# Patient Record
Sex: Male | Born: 1935
Health system: Southern US, Community
[De-identification: ages and names within clinical notes are randomized; demographics above are authoritative.]

## PROBLEM LIST (undated history)

## (undated) DIAGNOSIS — J45909 Unspecified asthma, uncomplicated: Secondary | ICD-10-CM

## (undated) DIAGNOSIS — K219 Gastro-esophageal reflux disease without esophagitis: Secondary | ICD-10-CM

## (undated) DIAGNOSIS — R259 Unspecified abnormal involuntary movements: Secondary | ICD-10-CM

## (undated) DIAGNOSIS — C801 Malignant (primary) neoplasm, unspecified: Secondary | ICD-10-CM

## (undated) DIAGNOSIS — R5381 Other malaise: Secondary | ICD-10-CM

## (undated) DIAGNOSIS — Z87891 Personal history of nicotine dependence: Secondary | ICD-10-CM

## (undated) DIAGNOSIS — L989 Disorder of the skin and subcutaneous tissue, unspecified: Secondary | ICD-10-CM

## (undated) DIAGNOSIS — K5730 Diverticulosis of large intestine without perforation or abscess without bleeding: Secondary | ICD-10-CM

## (undated) DIAGNOSIS — N400 Enlarged prostate without lower urinary tract symptoms: Secondary | ICD-10-CM

## (undated) DIAGNOSIS — Z85820 Personal history of malignant melanoma of skin: Secondary | ICD-10-CM

## (undated) DIAGNOSIS — E785 Hyperlipidemia, unspecified: Secondary | ICD-10-CM

## (undated) DIAGNOSIS — IMO0002 Reserved for concepts with insufficient information to code with codable children: Secondary | ICD-10-CM

## (undated) DIAGNOSIS — I10 Essential (primary) hypertension: Secondary | ICD-10-CM

## (undated) DIAGNOSIS — D509 Iron deficiency anemia, unspecified: Secondary | ICD-10-CM

## (undated) DIAGNOSIS — R5383 Other fatigue: Secondary | ICD-10-CM

## (undated) HISTORY — DX: Hyperlipidemia, unspecified: E78.5

## (undated) HISTORY — DX: Essential (primary) hypertension: I10

## (undated) HISTORY — DX: Unspecified asthma, uncomplicated: J45.909

## (undated) HISTORY — DX: Unspecified abnormal involuntary movements: R25.9

## (undated) HISTORY — PX: OTHER SURGICAL HISTORY: SHX169

## (undated) HISTORY — DX: Diverticulosis of large intestine without perforation or abscess without bleeding: K57.30

## (undated) HISTORY — PX: HERNIA REPAIR: SHX51

## (undated) HISTORY — DX: Other malaise: R53.81

## (undated) HISTORY — DX: Benign prostatic hyperplasia without lower urinary tract symptoms: N40.0

## (undated) HISTORY — DX: Personal history of malignant melanoma of skin: Z85.820

## (undated) HISTORY — DX: Personal history of nicotine dependence: Z87.891

## (undated) HISTORY — DX: Reserved for concepts with insufficient information to code with codable children: IMO0002

## (undated) HISTORY — DX: Other fatigue: R53.83

## (undated) HISTORY — DX: Disorder of the skin and subcutaneous tissue, unspecified: L98.9

## (undated) HISTORY — PX: COLONOSCOPY: SHX174

## (undated) HISTORY — DX: Gastro-esophageal reflux disease without esophagitis: K21.9

## (undated) HISTORY — DX: Iron deficiency anemia, unspecified: D50.9

---

## 1968-05-03 HISTORY — PX: OTHER SURGICAL HISTORY: SHX169

## 2000-10-29 ENCOUNTER — Encounter: Payer: Self-pay | Admitting: Internal Medicine

## 2004-08-19 ENCOUNTER — Encounter: Payer: Self-pay | Admitting: Internal Medicine

## 2009-12-03 ENCOUNTER — Ambulatory Visit: Payer: Self-pay | Admitting: Internal Medicine

## 2009-12-03 DIAGNOSIS — K5730 Diverticulosis of large intestine without perforation or abscess without bleeding: Secondary | ICD-10-CM

## 2009-12-03 DIAGNOSIS — Z87891 Personal history of nicotine dependence: Secondary | ICD-10-CM

## 2009-12-03 DIAGNOSIS — K219 Gastro-esophageal reflux disease without esophagitis: Secondary | ICD-10-CM

## 2009-12-03 DIAGNOSIS — R259 Unspecified abnormal involuntary movements: Secondary | ICD-10-CM

## 2009-12-03 DIAGNOSIS — Z85820 Personal history of malignant melanoma of skin: Secondary | ICD-10-CM

## 2009-12-03 DIAGNOSIS — N401 Enlarged prostate with lower urinary tract symptoms: Secondary | ICD-10-CM

## 2009-12-03 DIAGNOSIS — IMO0002 Reserved for concepts with insufficient information to code with codable children: Secondary | ICD-10-CM

## 2009-12-03 DIAGNOSIS — E785 Hyperlipidemia, unspecified: Secondary | ICD-10-CM

## 2009-12-03 DIAGNOSIS — L989 Disorder of the skin and subcutaneous tissue, unspecified: Secondary | ICD-10-CM | POA: Insufficient documentation

## 2009-12-03 DIAGNOSIS — R5383 Other fatigue: Secondary | ICD-10-CM

## 2009-12-03 DIAGNOSIS — J45909 Unspecified asthma, uncomplicated: Secondary | ICD-10-CM

## 2009-12-03 DIAGNOSIS — R5381 Other malaise: Secondary | ICD-10-CM

## 2009-12-03 DIAGNOSIS — R351 Nocturia: Secondary | ICD-10-CM

## 2009-12-03 DIAGNOSIS — N400 Enlarged prostate without lower urinary tract symptoms: Secondary | ICD-10-CM

## 2009-12-03 HISTORY — DX: Disorder of the skin and subcutaneous tissue, unspecified: L98.9

## 2009-12-03 HISTORY — DX: Diverticulosis of large intestine without perforation or abscess without bleeding: K57.30

## 2009-12-03 HISTORY — DX: Unspecified abnormal involuntary movements: R25.9

## 2009-12-03 HISTORY — DX: Personal history of nicotine dependence: Z87.891

## 2009-12-03 HISTORY — DX: Reserved for concepts with insufficient information to code with codable children: IMO0002

## 2009-12-03 HISTORY — DX: Other fatigue: R53.81

## 2009-12-03 HISTORY — DX: Hyperlipidemia, unspecified: E78.5

## 2009-12-03 HISTORY — DX: Personal history of malignant melanoma of skin: Z85.820

## 2009-12-03 HISTORY — DX: Benign prostatic hyperplasia without lower urinary tract symptoms: N40.0

## 2009-12-03 HISTORY — DX: Unspecified asthma, uncomplicated: J45.909

## 2009-12-03 HISTORY — DX: Gastro-esophageal reflux disease without esophagitis: K21.9

## 2009-12-03 LAB — CONVERTED CEMR LAB
ALT: 23 units/L (ref 0–53)
Albumin: 4.3 g/dL (ref 3.5–5.2)
Basophils Relative: 0.3 % (ref 0.0–3.0)
Bilirubin, Direct: 0.2 mg/dL (ref 0.0–0.3)
CO2: 28 meq/L (ref 19–32)
Chloride: 101 meq/L (ref 96–112)
Cholesterol: 175 mg/dL (ref 0–200)
Creatinine, Ser: 0.8 mg/dL (ref 0.4–1.5)
Eosinophils Absolute: 0.4 10*3/uL (ref 0.0–0.7)
Eosinophils Relative: 5.9 % — ABNORMAL HIGH (ref 0.0–5.0)
HCT: 44 % (ref 39.0–52.0)
HDL: 30.3 mg/dL — ABNORMAL LOW (ref >39.00)
Iron: 103 ug/dL (ref 42–165)
Ketones, ur: NEGATIVE mg/dL
LDL Cholesterol: 117 mg/dL — ABNORMAL HIGH (ref 0–99)
Lymphs Abs: 0.8 10*3/uL (ref 0.7–4.0)
MCHC: 35.1 g/dL (ref 30.0–36.0)
MCV: 98.9 fL (ref 78.0–100.0)
Monocytes Absolute: 0.3 10*3/uL (ref 0.1–1.0)
Neutro Abs: 4.5 10*3/uL (ref 1.4–7.7)
Neutrophils Relative %: 75.7 % (ref 43.0–77.0)
Potassium: 4.7 meq/L (ref 3.5–5.1)
RBC: 4.45 M/uL (ref 4.22–5.81)
Sed Rate: 36 mm/hr — ABNORMAL HIGH (ref 0–22)
Specific Gravity, Urine: 1.02 (ref 1.000–1.030)
TSH: 1.76 microintl units/mL (ref 0.35–5.50)
Total CHOL/HDL Ratio: 6
Total Protein: 7.1 g/dL (ref 6.0–8.3)
Triglycerides: 139 mg/dL (ref 0.0–149.0)
Urine Glucose: NEGATIVE mg/dL
pH: 7.5 (ref 5.0–8.0)

## 2009-12-12 ENCOUNTER — Encounter: Payer: Self-pay | Admitting: Internal Medicine

## 2009-12-15 ENCOUNTER — Ambulatory Visit: Payer: Self-pay

## 2009-12-15 ENCOUNTER — Encounter: Payer: Self-pay | Admitting: Internal Medicine

## 2010-01-01 ENCOUNTER — Ambulatory Visit: Payer: Self-pay | Admitting: Internal Medicine

## 2010-01-01 LAB — CONVERTED CEMR LAB
BUN: 24 mg/dL — ABNORMAL HIGH (ref 6–23)
CO2: 28 meq/L (ref 19–32)
Calcium: 9.2 mg/dL (ref 8.4–10.5)
GFR calc non Af Amer: 106.35 mL/min (ref >60)
Glucose, Bld: 83 mg/dL (ref 70–99)
Potassium: 5.5 meq/L — ABNORMAL HIGH (ref 3.5–5.1)
Sodium: 142 meq/L (ref 135–145)

## 2010-01-23 ENCOUNTER — Ambulatory Visit: Payer: Self-pay | Admitting: Internal Medicine

## 2010-01-29 ENCOUNTER — Telehealth: Payer: Self-pay | Admitting: Internal Medicine

## 2010-02-01 DIAGNOSIS — I10 Essential (primary) hypertension: Secondary | ICD-10-CM

## 2010-02-01 HISTORY — DX: Essential (primary) hypertension: I10

## 2010-02-18 ENCOUNTER — Telehealth: Payer: Self-pay | Admitting: Internal Medicine

## 2010-06-02 NOTE — Letter (Signed)
Summary: [facility]  [facility]   Imported By: Lester [facility] 12/09/2009 10:48:40  _____________________________________________________________________  External Attachment:    Type:   Image     Comment:   External Document

## 2010-06-02 NOTE — Miscellaneous (Signed)
Summary: Medical Directive  Medical Directive   Imported By: Lester [facility] 12/09/2009 10:50:01  _____________________________________________________________________  External Attachment:    Type:   Image     Comment:   External Document

## 2010-06-02 NOTE — Assessment & Plan Note (Signed)
Summary: NEW PT CPX/ LABS SAME DAY/ [employer] MEDICARE/NWS   Vital Signs:  Patient profile:   77 year old male Height:      67 inches Weight:      171 pounds BMI:     26.88 O2 Sat:      97 % on Room air Temp:     97.4 degrees F oral Pulse rate:   53 / minute BP sitting:   162 / 80  (left arm) Cuff size:   regular  Vitals Entered By: Zella Ball Ewing [name] Duncan Dull) (December 03, 2009 9:45 AM)  O2 Flow:  Room air  Preventive Care Screening  Colonoscopy:    Date:  05/03/1998    Next Due:  10/2009    Results:  Diverticulosis   Last Tetanus Booster:    Date:  12/03/2009    Results:  Td     declines colonosocpy at this time despite father with hx of colon cancer  CC: Adult Physical, New Patient/RE   CC:  Adult Physical and New Patient/RE.  History of Present Illness: Here to establish;  just moved ot [place], lives with sister - sees Dr Felicity Coyer  never married; last saw MD approx 5 yrs ago;  takes numerous substances "fed me by my sister."   Pt denies CP, sob, doe, wheezing, orthopnea, pnd, worsening LE edema, palps, dizziness or syncope  Pt denies new neuro symptoms such as headache, facial or extremity weakness   .Denies polydipsia, polyuria .  No fever, wt loss, night sweats, loss of appetite or other constitutional symptoms   Does have onoing right sholder pain that he can tolerate with tylenol, but ongoing for > 5 rys, slighltly this year.  Also has tremor or > 20 yrs , worse recently to write but can eat ok without significant trembling.  .  No known hx of elev BP in the past.    Here for wellness Diet: Heart Healthy or DM if diabetic Physical Activities: active with gardening at home Depression/mood screen: Negative Hearing: milld decrased  bilateral since [place]  Visual Acuity: Grossly normal, gets exam yearly, wears bifocals ADL's: Capable  Fall Risk: None Home Safety: Good Cognitive Impairment:  Gen appearance, affect, speech, memory, attention & motor skills grossly  intact End-of-Life Planning: Advance directive - Full code/I agree  but has living will - does not want mech vent after 3 days  Preventive Screening-Counseling & Management  Alcohol-Tobacco     Smoking Status: quit      Drug Use:  no.    Problems Prior to Update: None  Medications Prior to Update: 1)  None  Current Medications (verified): 1)  Aspir-Low 81 Mg Tbec (Aspirin) .[name].. 1 By Mouth Once Daily 2)  B-100  Tabs (Vitamins-Lipotropics) .[name].. 1 By Mouth Once Daily 3)  Ester-C 500-550 Mg Tabs (Bioflavonoid Products) .[name].. 1 By Mouth Once Daily 4)  Centrum Silver  Tabs (Multiple Vitamins-Minerals) .[name].. 1 By Mouth Once Daily 5)  Cinnamon 500 Mg Tabs (Cinnamon) .[name].. 1 By Mouth Once Daily 6)  Vitamin D 400 Unit Tabs (Cholecalciferol) .[name].. 1 By Mouth Once Daily 7)  Iron 325 (65 Fe) Mg Tabs (Ferrous Sulfate) .[name].. 1 By Mouth Once Daily 8)  Lycopene  Caps (Lycopene) .[name].. 1 By Mouth Once Daily 9)  Magnesium 200 Mg Tabs (Magnesium) .... 2 By Mouth Once Daily 10)  Lisinopril 20 Mg Tabs (Lisinopril) .[name].. 1po Once Daily  Allergies (verified): 1)  ! 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 father died with colon cancer - advanced age mother with heart failure  Social History: Reviewed history and no changes required. 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 Smoker - none since approx 1985 Alcohol use-yes - 1 shot per day active with gardening /has living will no children/never married Drug use-no Smoking Status:  quit Drug Use:  no  Review of Systems  The patient denies anorexia, fever, weight loss, weight gain, vision loss, decreased hearing, hoarseness, chest pain, syncope, dyspnea on exertion, peripheral edema, prolonged cough, headaches, hemoptysis, abdominal pain, melena, hematochezia, severe indigestion/heartburn, hematuria, muscle weakness, suspicious skin lesions, transient blindness, difficulty walking, depression, unusual weight change, abnormal bleeding, enlarged lymph nodes, and angioedema.         all otherwise negative per pt -  except for ongoing fatigue without OSA symtpoms  Physical Exam  General:  alert and well-developed.   Head:  normocephalic and atraumatic.   Eyes:  vision grossly intact, pupils equal, and pupils round.   Ears:  R ear normal and L ear normal.   Nose:  no external deformity and no nasal discharge.   Mouth:  no gingival abnormalities and pharynx pink and moist.   Neck:  supple and no masses.   Lungs:  normal respiratory effort and normal breath sounds.   Heart:  normal rate and regular rhythm.   Abdomen:  soft, non-tender, and normal bowel sounds.   Msk:  no joint tenderness and no joint swelling.   Extremities:  no edema, no erythema  Neurologic:  cranial nerves II-XII intact and strength normal in all extremities.  , tremor mild noted to hands Skin:  color normal and no rashes.  but several keratinic lesions to the back noted Psych:  not depressed appearing and moderately anxious.     Impression & Recommendations:  Problem  # 1:  Preventive Health Care (ICD-V70.0)  Overall doing well, age appropriate education and counseling updated and referral for appropriate preventive services done unless declined, immunizations up to date or declined, diet counseling done if overweight, urged to quit smoking if smokes , most recent labs reviewed and current ordered if appropriate, ecg reviewed or declined (interpretation per ECG scanned in the EMR if done); information regarding Medicare Prevention requirements given if appropriate; speciality referrals updated as appropriate ; also for aoritc u/s  Orders: Radiology Referral (Radiology) First annual wellness visit with prevention plan  (Y8657)  Problem # 2:  SKIN LESION (ICD-709.9)  for derm referral, and f/u melanoma  Orders: Dermatology Referral (Derma)  Problem # 3:  HYPERTENSION NEC (ICD-997.91) to start lisinopril 20 once daily  Orders: Radiology Referral (Radiology) EKG w/  Interpretation (93000) Prescription Created Electronically [phone])  Problem # 4:  HYPERLIPIDEMIA (ICD-272.4) for lipid check Orders: Radiology Referral (Radiology) TLB-Lipid Panel (80061-LIPID)  Problem # 5:  FATIGUE (ICD-780.79)  mild subjective, exam benign, to check labs below; follow with expectant management   Orders: TLB-BMP (Basic Metabolic Panel-BMET) (80048-METABOL) TLB-CBC Platelet - w/Differential (85025-CBCD) TLB-Hepatic/Liver Function Pnl (80076-HEPATIC) TLB-TSH (Thyroid Stimulating Hormone) (84443-TSH) TLB-Sedimentation Rate (ESR) (85652-ESR) TLB-IBC Pnl (Iron/FE;Transferrin) (83550-IBC) TLB-B12 + Folate Pnl (60454_09811-B14/NWG) TLB-Udip ONLY (81003-UDIP) T-Vitamin D (25-Hydroxy) (95621-30865)  Complete Medication List: 1)  Aspir-low 81 Mg Tbec (Aspirin) .[name].. 1 by mouth once daily 2)  B-100 Tabs (Vitamins-lipotropics) .[name].. 1 by mouth once daily 3)  Ester-c 500-550 Mg Tabs (Bioflavonoid products) .[name].. 1 by mouth once daily 4)  Centrum Silver Tabs (Multiple  vitamins-minerals) .[name].. 1 by mouth once daily 5)  Cinnamon 500 Mg Tabs (Cinnamon) .[name].. 1 by mouth once daily 6)  Vitamin D 400 Unit Tabs (Cholecalciferol) .[name].. 1 by mouth once daily 7)  Iron 325 (65 Fe) Mg Tabs (Ferrous sulfate) .[name].. 1 by mouth once daily 8)  Lycopene Caps (Lycopene) .[name].. 1 by mouth once daily 9)  Magnesium 200 Mg Tabs (Magnesium) .... 2 by mouth once daily 10)  Lisinopril 20 Mg Tabs (Lisinopril) .[name].. 1po once daily  Other Orders: TD Toxoids IM 7 YR + (78469) Admin 1st Vaccine (62952) TLB-PSA (Prostate Specific Antigen) (84153-PSA)  Patient Instructions: 1)  Please go to the Lab in the basement for your blood and/or urine tests today 2)  Please call the number on the blue card for results 3)  You will be contacted about the referral(s) to: Dermatology, and the aortic ultrasound to check for aneurysm 4)  you had the tetanus shot today 5)  you had the Hep A shot today (shot number 1 of 2) 6)  you had the pneumonia shot today 7)  Please take all new medications as prescribed - the lisinopril 20 mg per day (watch for any dry cough that might occur) 8)  Continue all previous medications as before this visit 9)  Please schedule a follow-up appointment in 1 month to re-check the  blood pressure and more blood work to check the kidney function and potassum on the new medication;  also for the second Hep A shot repeat on that day Prescriptions: LISINOPRIL 20 MG TABS (LISINOPRIL) 1po once daily  #90 x 3   Entered and Authorized by:   Corwin Levins MD   Signed by:   Corwin Levins MD on 12/03/2009   Method used:   Print then Give to Patient   RxID:   [id number]    Immunization History:  Zostavax History:    Zostavax # 1:  zostavax (05/03/2008)  Immunizations Administered:  Tetanus Vaccine:    Vaccine Type: Td    Site: left deltoid    Mfr: Sanofi Pasteur    Dose: 0.5 ml    Route: IM    Given by: Scharlene Gloss [name] (AAMA)    Exp. Date: 06/04/2011    Lot #: [id number]     VIS given: 03/21/07 version given December 03, 2009.    Immunization History:  Zostavax History:    Zostavax # 1:  Zostavax (05/03/2008)  Immunizations Administered:  Tetanus Vaccine:    Vaccine Type: Td    Site: left deltoid    Mfr: Sanofi Pasteur    Dose: 0.5 ml    Route: IM    Given by: Zella Ball Ewing [name] (AAMA)  Exp. Date: 06/04/2011    Lot #: [id number]    VIS given: 03/21/07 version given December 03, 2009.  Appended Document: Immunization Entry      Immunizations Administered:  Pneumonia Vaccine:    Vaccine Type: Pneumovax    Site: right deltoid    Mfr: Merck    Dose: 0.5 ml    Route: IM    Given by: Zella Ball Ewing [name] (AAMA)    Exp. Date: 05/02/2011    Lot #: [id number]    VIS given: 11/29/95 version given December 03, 2009.  Hepatitis A Vaccine # 1:    Vaccine Type: HepA    Site: right deltoid    Mfr: GlaxoSmithKline    Dose: 0.5 ml    Route: IM    Given by: Zella Ball Ewing [name] (AAMA)    Exp. Date: 05/31/2011    Lot #: [id number]    VIS given: 07/21/04 version given December 03, 2009.

## 2010-06-02 NOTE — Assessment & Plan Note (Signed)
Summary: 1 month re-check/re-ch blood pressure/2nd hep a shot/cd   Vital Signs:  Patient profile:   77 year old male Height:      66 inches Weight:      169.25 pounds BMI:     27.42 O2 Sat:      95 % on Room air Temp:     97.4 degrees F oral Pulse rate:   52 / minute BP sitting:   120 / 80  (left arm) Cuff size:   regular  Vitals Entered By: Zella Ball Ewing [name] (AAMA) (January 01, 2010 9:09 AM)  O2 Flow:  Room air CC: 1 month ROV/RE, Abdominal Pain   CC:  1 month ROV/RE and Abdominal Pain.  History of Present Illness: with weather imrpoved, now walking 3.5 miles per day,  Pt denies CP, worsening sob, doe, wheezing, orthopnea, pnd, worsening LE edema, palps, dizziness or syncope  Pt denies new neuro symptoms such as headache, facial or extremity weakness  .Denies polydipsia or polyuria.  Due for second hep a shot today.  Overall good med compliacne and tolerance.    Dyspepsia History:      There is a prior history of GERD.    Problems Prior to Update: 1)  Special Screening Malig Neoplasms Other Sites  (ICD-V76.49) 2)  Fatigue  (ICD-780.79) 3)  Hypertension Nec  (ICD-997.91) 4)  Preventive Health Care  (ICD-V70.0) 5)  Tobacco Use, Quit  (ICD-V15.82) 6)  Hyperlipidemia  (ICD-272.4) 7)  Tremor  (ICD-781.0) 8)  Asthma  (ICD-493.90) 9)  Benign Prostatic Hypertrophy  (ICD-600.00) 10)  Diverticulosis, Colon  (ICD-562.10) 11)  Skin Lesion  (ICD-709.9) 12)  Malignant Melanoma, Hx of  (ICD-V10.82) 13)  Gerd  (ICD-530.81)  Medications Prior to Update: 1)  Aspir-Low 81 Mg Tbec (Aspirin) .[name].. 1 By Mouth Once Daily 2)  B-100  Tabs (Vitamins-Lipotropics) .[name].. 1 By Mouth Once Daily 3)  Ester-C 500-550 Mg Tabs (Bioflavonoid Products) .[name].. 1 By Mouth Once Daily 4)  Centrum Silver  Tabs (Multiple Vitamins-Minerals) .[name].. 1 By Mouth Once Daily 5)  Cinnamon 500 Mg Tabs (Cinnamon) .[name].. 1 By Mouth Once Daily 6)  Vitamin D 400 Unit Tabs (Cholecalciferol) .[name].. 1 By Mouth Once Daily 7)  Iron 325  (65 Fe) Mg Tabs (Ferrous Sulfate) .[name].. 1 By Mouth Once Daily 8)  Lycopene  Caps (Lycopene) .[name].. 1 By Mouth Once Daily 9)  Magnesium 200 Mg Tabs (Magnesium) .... 2 By Mouth Once Daily 10)  Lisinopril 20 Mg Tabs (Lisinopril) .[name].. 1po Once Daily  Current Medications (verified): 1)  Aspir-Low 81 Mg Tbec (Aspirin) .[name].. 1 By Mouth Once Daily 2)  B-100  Tabs (Vitamins-Lipotropics) .[name].. 1 By Mouth Once Daily 3)  Ester-C 500-550 Mg Tabs (Bioflavonoid Products) .[name].. 1 By Mouth Once Daily 4)  Centrum Silver  Tabs (Multiple Vitamins-Minerals) .[name].. 1 By Mouth Once Daily 5)  Cinnamon 500 Mg Tabs (Cinnamon) .[name].. 1 By Mouth Once Daily 6)  Vitamin D 400 Unit Tabs (Cholecalciferol) .[name].. 1 By Mouth Once Daily 7)  Iron 325 (65 Fe) Mg Tabs (Ferrous Sulfate) .[name].. 1 By Mouth Once Daily 8)  Lycopene  Caps (Lycopene) .[name].. 1 By Mouth Once Daily 9)  Magnesium 200 Mg Tabs (Magnesium) .... 2 By Mouth Once Daily 10)  Amlodipine Besylate 5 Mg Tabs (Amlodipine Besylate) .[name].. 1po Once Daily  Allergies (verified): 1)  ! 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   Head:  normocephalic and atraumatic.   Eyes:  vision grossly intact, pupils equal, and pupils round.   Ears:  R ear normal and L ear normal.   Nose:  no external deformity and no nasal discharge.   Mouth:  no gingival abnormalities and pharynx pink and moist.   Neck:  supple and no masses.   Lungs:  normal respiratory effort and normal breath sounds.   Heart:  normal rate and regular rhythm.   Extremities:  no edema, no erythema    Impression & Recommendations:  Problem # 1:  HYPERTENSION NEC (ICD-997.91) improved, cont meds as is, to check BMET to r/o metabolic abnormal on new med or renal inufficiency Orders: TLB-BMP (Basic Metabolic Panel-BMET) (80048-METABOL)  Complete Medication List: 1)  Aspir-low 81 Mg Tbec (Aspirin) .[name].. 1 by mouth once daily 2)  B-100 Tabs (Vitamins-lipotropics) .[name].. 1 by mouth once daily 3)  Ester-c 500-550 Mg Tabs (Bioflavonoid products) .[name].. 1 by mouth once daily 4)  Centrum Silver Tabs (Multiple vitamins-minerals) .[name].. 1 by mouth once daily 5)  Cinnamon 500 Mg Tabs (Cinnamon) .[name].. 1 by mouth once daily 6)  Vitamin D 400 Unit Tabs (Cholecalciferol) .[name].. 1 by mouth once daily 7)  Iron 325 (65 Fe) Mg Tabs (Ferrous sulfate) .[name].. 1 by mouth once daily 8)  Lycopene Caps (Lycopene) .[name].. 1 by mouth once daily 9)  Magnesium 200 Mg Tabs (Magnesium) .... 2 by mouth once daily 10)  Amlodipine Besylate 5 Mg Tabs (Amlodipine besylate) .[name].. 1po once daily  Other Orders: Hepatitis A Vaccine (Adult Dose) (16109) Admin 1st Vaccine (60454)   Patient Instructions: 1)  Continue all previous medications as before this visit 2)  Please go to the Lab in the basement for your blood  and/or urine tests today 3)  Please schedule a follow-up appointment in 1 year or sooner if needed   Immunizations Administered:  Hepatitis A Vaccine # 2:    Vaccine Type: HepA    Site: left deltoid    Mfr: GlaxoSmithKline    Dose: 0.5 ml    Route: IM    Given by: Zella Ball Ewing [name] (AAMA)    Exp. Date: 05/31/2011    Lot #: [id number]    VIS given: 07/21/04 version given January 01, 2010.

## 2010-06-02 NOTE — Progress Notes (Signed)
  Phone Note From Other Clinic   Caller: Dr. Elba Barman, Dentist Summary of Call: Dr. Lamar Sprinkles office called and the pt has appt. to have an extraction at their office. Is there any medical reason to be concerned with for this procedure. Please Advise? Call back number is [phone] Initial call taken by: Robin Ewing [name] Duncan Dull),  January 29, 2010 3:57 PM  Follow-up for Phone Call        ok for extraction as planned Follow-up by: Corwin Levins MD,  January 29, 2010 4:08 PM  Additional Follow-up for Phone Call Additional follow up Details #1::        Called and informed Dental office of above information. Additional Follow-up by: Robin Ewing [name] Duncan Dull),  January 29, 2010 4:36 PM

## 2010-06-02 NOTE — Miscellaneous (Signed)
Summary: Orders Update  Clinical Lists Changes  Orders: Added new Test order of Abdominal Aorta Duplex (Abd Aorta Duplex) - Signed 

## 2010-06-02 NOTE — Progress Notes (Signed)
Summary: Pharmacy change  Phone Note Refill Request Message from:  Patient on February 18, 2010 10:53 AM  Refills Requested: Medication #1:  AMLODIPINE BESYLATE 5 MG TABS 1po once daily.  Method Requested: Electronic Initial call taken by: Margaret Pyle, [name],  February 18, 2010 10:52 AM    Prescriptions: AMLODIPINE BESYLATE 5 MG TABS (AMLODIPINE BESYLATE) 1po once daily  #90 x 3   Entered by:   Margaret Pyle, [name]   Authorized by:   Corwin Levins MD   Signed by:   Margaret Pyle, [name] on 02/18/2010   Method used:   Faxed to ...       [facility] (mail-order)             , [place]         Ph:        Fax: [phone]   RxID:   [id number]

## 2010-06-02 NOTE — Assessment & Plan Note (Signed)
Summary: f/u appt/#/cd   Vital Signs:  Patient profile:   77 year old male Height:      68 inches Weight:      169.25 pounds BMI:     25.83 O2 Sat:      95 % on Room air Temp:     97.9 degrees F oral Pulse rate:   52 / minute BP sitting:   132 / 62  (left arm) Cuff size:   regular  Vitals Entered By: Zella Ball Ewing [name] Duncan Dull) (January 23, 2010 10:43 AM)  O2 Flow:  Room air CC: Followup/RE   CC:  Followup/RE.  History of Present Illness: here to f/u - overall doing well, Pt denies CP, worsening sob, doe, wheezing, orthopnea, pnd, worsening LE edema, palps, dizziness or syncope  Pt denies new neuro symptoms such as headache, facial or extremity weakness  No fever, wt loss, night sweats, loss of appetite or other constitutional symptoms   Problems Prior to Update: 1)  Special Screening Malig Neoplasms Other Sites  (ICD-V76.49) 2)  Fatigue  (ICD-780.79) 3)  Hypertension Nec  (ICD-997.91) 4)  Preventive Health Care  (ICD-V70.0) 5)  Tobacco Use, Quit  (ICD-V15.82) 6)  Hyperlipidemia  (ICD-272.4) 7)  Tremor  (ICD-781.0) 8)  Asthma  (ICD-493.90) 9)  Benign Prostatic Hypertrophy  (ICD-600.00) 10)  Diverticulosis, Colon  (ICD-562.10) 11)  Skin Lesion  (ICD-709.9) 12)  Malignant Melanoma, Hx of  (ICD-V10.82) 13)  Gerd  (ICD-530.81)  Medications Prior to Update: 1)  Aspir-Low 81 Mg Tbec (Aspirin) .[name].. 1 By Mouth Once Daily 2)  B-100  Tabs (Vitamins-Lipotropics) .[name].. 1 By Mouth Once Daily 3)  Ester-C 500-550 Mg Tabs (Bioflavonoid Products) .[name].. 1 By Mouth Once Daily 4)  Centrum Silver  Tabs (Multiple Vitamins-Minerals) .[name].. 1 By Mouth Once Daily 5)  Cinnamon 500 Mg Tabs (Cinnamon) .[name].. 1 By Mouth Once Daily 6)  Vitamin D 400 Unit Tabs (Cholecalciferol) .[name].. 1 By Mouth Once Daily 7)  Iron 325 (65 Fe) Mg Tabs (Ferrous Sulfate) .[name].. 1 By Mouth Once Daily 8)  Lycopene  Caps (Lycopene) .[name].. 1 By Mouth Once Daily 9)  Magnesium 200 Mg Tabs (Magnesium) .... 2 By Mouth Once Daily 10)   Amlodipine Besylate 5 Mg Tabs (Amlodipine Besylate) .[name].. 1po Once Daily  Current Medications (verified): 1)  Aspir-Low 81 Mg Tbec (Aspirin) .[name].. 1 By Mouth Once Daily 2)  B-100  Tabs (Vitamins-Lipotropics) .[name].. 1 By Mouth Once Daily 3)  Ester-C 500-550 Mg Tabs (Bioflavonoid Products) .[name].. 1 By Mouth Once Daily 4)  Centrum Silver  Tabs (Multiple Vitamins-Minerals) .[name].. 1 By Mouth Once Daily 5)  Cinnamon 500 Mg Tabs (Cinnamon) .[name].. 1 By Mouth Once Daily 6)  Vitamin D 400 Unit Tabs (Cholecalciferol) .[name].. 1 By Mouth Once Daily 7)  Iron 325 (65 Fe) Mg Tabs (Ferrous Sulfate) .[name].. 1 By Mouth Once Daily 8)  Lycopene  Caps (Lycopene) .[name].. 1 By Mouth Once Daily 9)  Magnesium 200 Mg Tabs (Magnesium) .... 2 By Mouth Once Daily 10)  Amlodipine Besylate 5 Mg Tabs (Amlodipine Besylate) .[name].. 1po Once Daily  Allergies (verified): 1)  ! 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   Head:  normocephalic and atraumatic.   Eyes:  vision grossly intact, pupils equal, and pupils round.   Ears:  R ear normal and L ear normal.   Nose:  no external deformity and no nasal discharge.   Mouth:  no gingival  abnormalities and pharynx pink and moist.   Neck:  supple and no masses.   Lungs:  normal respiratory effort and normal breath sounds.   Heart:  normal rate and regular rhythm.   Extremities:  no edema, no erythema    Impression & Recommendations:  Problem # 1:  HYPERTENSION (ICD-401.9)  His updated medication list for this problem includes:    Amlodipine Besylate 5 Mg Tabs (Amlodipine besylate) .[name]... 1po once daily  BP today: 132/62 Prior BP: 120/80 (01/01/2010)  Labs Reviewed: K+: 5.5 (01/01/2010) Creat: : 0.8 (01/01/2010)   Chol: 175 (12/03/2009)   HDL: 30.30 (12/03/2009)   LDL: 117 (12/03/2009)   TG: 139.0 (12/03/2009) stable overall by hx and exam, ok to continue meds/tx as is   Complete Medication List: 1)  Aspir-low 81 Mg Tbec (Aspirin) .[name].. 1 by mouth once daily 2)  B-100 Tabs (Vitamins-lipotropics) .[name].. 1 by mouth once daily 3)  Ester-c 500-550 Mg Tabs (Bioflavonoid products) .[name].. 1 by mouth once daily 4)  Centrum Silver Tabs (Multiple vitamins-minerals) .[name].. 1 by mouth once daily 5)  Cinnamon 500 Mg Tabs (Cinnamon) .[name].. 1 by mouth once daily 6)  Vitamin D 400 Unit Tabs (Cholecalciferol) .[name].. 1 by mouth once daily 7)  Iron 325 (65 Fe) Mg Tabs (Ferrous sulfate) .[name].. 1 by mouth once daily 8)  Lycopene Caps (Lycopene) .[name].. 1 by mouth once daily 9)  Magnesium 200 Mg Tabs (Magnesium) .... 2 by mouth once daily 10)  Amlodipine Besylate 5 Mg Tabs (Amlodipine besylate) .[name].. 1po once daily  Patient Instructions: 1)  Continue all previous medications as before this visit 2)  Please schedule a follow-up appointment in 6 months.

## 2010-07-08 ENCOUNTER — Other Ambulatory Visit: Payer: Self-pay | Admitting: Internal Medicine

## 2010-07-08 ENCOUNTER — Other Ambulatory Visit: Payer: Medicare Other

## 2010-07-08 ENCOUNTER — Encounter: Payer: Self-pay | Admitting: Internal Medicine

## 2010-07-08 ENCOUNTER — Ambulatory Visit (INDEPENDENT_AMBULATORY_CARE_PROVIDER_SITE_OTHER): Payer: Medicare Other | Admitting: Internal Medicine

## 2010-07-08 DIAGNOSIS — J45909 Unspecified asthma, uncomplicated: Secondary | ICD-10-CM

## 2010-07-08 DIAGNOSIS — D509 Iron deficiency anemia, unspecified: Secondary | ICD-10-CM

## 2010-07-08 DIAGNOSIS — E785 Hyperlipidemia, unspecified: Secondary | ICD-10-CM

## 2010-07-08 DIAGNOSIS — I10 Essential (primary) hypertension: Secondary | ICD-10-CM

## 2010-07-08 HISTORY — DX: Iron deficiency anemia, unspecified: D50.9

## 2010-07-08 LAB — CBC WITH DIFFERENTIAL/PLATELET
Eosinophils Relative: 10.7 % — ABNORMAL HIGH (ref 0.0–5.0)
HCT: 42.4 % (ref 39.0–52.0)
Hemoglobin: 14.7 g/dL (ref 13.0–17.0)
Lymphs Abs: 0.9 10*3/uL (ref 0.7–4.0)
MCV: 99.6 fl (ref 78.0–100.0)
Monocytes Absolute: 0.3 10*3/uL (ref 0.1–1.0)
Monocytes Relative: 5.8 % (ref 3.0–12.0)
Neutro Abs: 3.6 10*3/uL (ref 1.4–7.7)
Platelets: 190 10*3/uL (ref 150.0–400.0)
WBC: 5.3 10*3/uL (ref 4.5–10.5)

## 2010-07-08 LAB — IBC PANEL: Saturation Ratios: 23.7 % (ref 20.0–50.0)

## 2010-07-14 NOTE — Assessment & Plan Note (Signed)
Summary: PER PT WAS REQUESTED TO SCHED CHECK IF MED IS WORKING---STC   Vital Signs:  Patient profile:   77 year old male Height:      68 inches Weight:      172.25 pounds BMI:     26.29 O2 Sat:      97 % on Room air Temp:     97.7 degrees F oral Pulse rate:   53 / minute BP sitting:   108 / 64  (left arm) Cuff size:   regular  Vitals Entered By: Zella Ball Ewing [name] (AAMA) (July 08, 2010 11:12 AM)  O2 Flow:  Room air CC: followup/RE   CC:  followup/RE.  History of Present Illness: here to f/ul; overall doing ok;  Pt denies CP, worsening sob, doe, wheezing, orthopnea, pnd, worsening LE edema, palps, dizziness or syncope  Pt denies new neuro symptoms such as headache, facial or extremity weakness  No nighttime awakenings with wheezing. Pt denies polydipsia, polyuria, or low sugar symptoms such as shakiness improved with eating.  Overall good compliance with meds, trying to follow low chol, DM diet, wt stable, little excercise however  No fever, wt loss, night sweats, loss of appetite or other constitutional symptoms  Overall good compliance with meds, and good tolerability.  Denies worsening depressive symptoms, suicidal ideation, or panic.  Pt did take himself off the iron for 4 wks, and wants to see if Hgb stable without  it.  No overt bleeding or bruising, orthostasis or increased fatigue.    Problems Prior to Update: 1)  Anemia-iron Deficiency  (ICD-280.9) 2)  Hypertension  (ICD-401.9) 3)  Special Screening Malig Neoplasms Other Sites  (ICD-V76.49) 4)  Fatigue  (ICD-780.79) 5)  Hypertension Nec  (ICD-997.91) 6)  Preventive Health Care  (ICD-V70.0) 7)  Tobacco Use, Quit  (ICD-V15.82) 8)  Hyperlipidemia  (ICD-272.4) 9)  Tremor  (ICD-781.0) 10)  Asthma  (ICD-493.90) 11)  Benign Prostatic Hypertrophy  (ICD-600.00) 12)  Diverticulosis, Colon  (ICD-562.10) 13)  Skin Lesion  (ICD-709.9) 14)  Malignant Melanoma, Hx of  (ICD-V10.82) 15)  Gerd  (ICD-530.81)  Medications Prior to  Update: 1)  Aspir-Low 81 Mg Tbec (Aspirin) .[name].. 1 By Mouth Once Daily 2)  B-100  Tabs (Vitamins-Lipotropics) .[name].. 1 By Mouth Once Daily 3)  Ester-C 500-550 Mg Tabs (Bioflavonoid Products) .[name].. 1 By Mouth Once Daily 4)  Centrum Silver  Tabs (Multiple Vitamins-Minerals) .[name].. 1 By Mouth Once Daily 5)  Cinnamon 500 Mg Tabs (Cinnamon) .[name].. 1 By Mouth Once Daily 6)  Vitamin D 400 Unit Tabs (Cholecalciferol) .[name].. 1 By Mouth Once Daily 7)  Iron 325 (65 Fe) Mg Tabs (Ferrous Sulfate) .[name].. 1 By Mouth Once Daily 8)  Lycopene  Caps (Lycopene) .[name].. 1 By Mouth Once Daily 9)  Magnesium 200 Mg Tabs (Magnesium) .... 2 By Mouth Once Daily 10)  Amlodipine Besylate 5 Mg Tabs (Amlodipine Besylate) .[name].. 1po Once Daily  Current Medications (verified): 1)  Aspir-Low 81 Mg Tbec (Aspirin) .[name].. 1 By Mouth Once Daily 2)  B-100  Tabs (Vitamins-Lipotropics) .[name].. 1 By Mouth Once Daily 3)  Ester-C 500-550 Mg Tabs (Bioflavonoid Products) .[name].. 1 By Mouth Once Daily 4)  Centrum Silver  Tabs (Multiple Vitamins-Minerals) .[name].. 1 By Mouth Once Daily 5)  Cinnamon 500 Mg Tabs (Cinnamon) .[name].. 1 By Mouth Once Daily 6)  Vitamin D 400 Unit Tabs (Cholecalciferol) .[name].. 1 By Mouth Once Daily 7)  Lycopene  Caps (Lycopene) .[name].. 1 By Mouth Once Daily 8)  Magnesium 200 Mg  Tabs (Magnesium) .... 2 By Mouth Once Daily 9)  Amlodipine Besylate 5 Mg Tabs (Amlodipine Besylate) .[name].. 1po Once Daily  Allergies (verified): 1)  ! 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   Head:  normocephalic and atraumatic.   Eyes:  vision grossly intact, pupils equal, and pupils round.   Ears:  R ear normal and L ear normal.   Nose:  no external deformity and no nasal discharge.   Mouth:  no gingival abnormalities and pharynx pink and moist.   Neck:  supple and no masses.   Lungs:  normal respiratory effort and normal breath sounds.   Heart:  normal rate and regular rhythm.   Abdomen:  soft, non-tender, and normal bowel sounds.   Extremities:  no edema, no erythema    Impression & Recommendations:  Problem # 1:  ANEMIA-IRON DEFICIENCY (ICD-280.9)  The following medications were removed from the medication list:    Iron 325 (65 Fe) Mg Tabs (Ferrous sulfate) .[name]... 1 by mouth once daily  Orders: TLB-IBC Pnl (Iron/FE;Transferrin) (83550-IBC) TLB-CBC Platelet - w/Differential (85025-CBCD) stable overall by hx and exam, ok to continue meds/tx as is   Hgb: 15.4 (12/03/2009)   Hct: 44.0 (12/03/2009)   Platelets: 176.0 (12/03/2009) RBC: 4.45 (12/03/2009)   RDW: 12.5 (12/03/2009)   WBC: 5.9 (12/03/2009) MCV: 98.9 (12/03/2009)   MCHC: 35.1 (12/03/2009) Iron: 103 (12/03/2009)   % Sat: 29.9 (12/03/2009) B12: 596 (12/03/2009)   Folate: >20.0 ng/mL (12/03/2009)   TSH: 1.76 (12/03/2009)  Problem # 2:  HYPERTENSION (ICD-401.9)  His updated medication list for this problem includes:    Amlodipine Besylate 5 Mg Tabs (Amlodipine besylate) .[name]... 1po once daily  BP today: 108/64 Prior BP: 132/62 (01/23/2010)  Labs Reviewed: K+: 5.5 (01/01/2010) Creat: : 0.8 (01/01/2010)   Chol: 175 (12/03/2009)   HDL: 30.30 (12/03/2009)   LDL: 117  (12/03/2009)   TG: 139.0 (12/03/2009) stable overall by hx and exam, ok to continue meds/tx as is   Problem # 3:  HYPERLIPIDEMIA (ICD-272.4)  Labs Reviewed: SGOT: 25 (12/03/2009)   SGPT: 23 (12/03/2009)   HDL:30.30 (12/03/2009)  LDL:117 (12/03/2009)  Chol:175 (12/03/2009)  Trig:139.0 (12/03/2009) stable overall by hx and exam, ok to continue meds/tx as is , Pt to continue diet efforts,   - goal LDL less than 100  Problem # 4:  ASTHMA (ICD-493.90)  Pulmonary Functions Reviewed: O2 sat: 97 (07/08/2010) stable overall by hx and exam, ok to continue meds/tx as is   Complete Medication List: 1)  Aspir-low 81 Mg Tbec (Aspirin) .[name].. 1 by mouth once daily 2)  B-100 Tabs (Vitamins-lipotropics) .[name].. 1 by mouth once daily 3)  Ester-c 500-550 Mg Tabs (Bioflavonoid products) .Marland KitchenMarland KitchenMarland Kitchen 1  by mouth once daily 4)  Centrum Silver Tabs (Multiple vitamins-minerals) .[name].. 1 by mouth once daily 5)  Cinnamon 500 Mg Tabs (Cinnamon) .[name].. 1 by mouth once daily 6)  Vitamin D 400 Unit Tabs (Cholecalciferol) .[name].. 1 by mouth once daily 7)  Lycopene Caps (Lycopene) .[name].. 1 by mouth once daily 8)  Magnesium 200 Mg Tabs (Magnesium) .... 2 by mouth once daily 9)  Amlodipine Besylate 5 Mg Tabs (Amlodipine besylate) .[name].. 1po once daily  Patient Instructions: 1)  Continue all previous medications as before this visit 2)  Please go to the Lab in the basement for your blood and/or urine tests today 3)  Please schedule a follow-up appointment in August 2012 for CPX with labs   Orders Added: 1)  TLB-IBC Pnl (Iron/FE;Transferrin) [83550-IBC] 2)  TLB-CBC Platelet - w/Differential [85025-CBCD] 3)  Est. Patient Level IV [16109]

## 2010-09-09 ENCOUNTER — Encounter: Payer: Self-pay | Admitting: Internal Medicine

## 2010-09-09 ENCOUNTER — Ambulatory Visit (INDEPENDENT_AMBULATORY_CARE_PROVIDER_SITE_OTHER): Payer: Medicare Other | Admitting: Internal Medicine

## 2010-09-09 VITALS — BP 130/72 | HR 56 | Temp 97.5°F | Ht 68.0 in | Wt 170.0 lb

## 2010-09-09 DIAGNOSIS — Z0000 Encounter for general adult medical examination without abnormal findings: Secondary | ICD-10-CM | POA: Insufficient documentation

## 2010-09-09 DIAGNOSIS — R1901 Right upper quadrant abdominal swelling, mass and lump: Secondary | ICD-10-CM

## 2010-09-09 DIAGNOSIS — H579 Unspecified disorder of eye and adnexa: Secondary | ICD-10-CM

## 2010-09-09 DIAGNOSIS — I10 Essential (primary) hypertension: Secondary | ICD-10-CM

## 2010-09-09 NOTE — Assessment & Plan Note (Addendum)
?   Muscular hypertrophy vs mass; doubt hernia, for abd u/s for further evaluation, pain suggestive of GB, consider HIDA scan,   to f/u any worsening symptoms or concerns

## 2010-09-09 NOTE — Progress Notes (Signed)
Subjective:    Patient ID: Jason Lane, male    DOB: [birth year], 77 y.o.   MRN: [id number]  HPI Here with c/o 2-3 wks onset intermittent now more constant right abdomen/ruq dull discomfort, worse to eat, radiates to the back,a ssoc with nausea, but no f/c, bowel change, wt loss, blood, or vomiting. Nothing seems to help, including otc antacid, nor change in diet.  Also incidentally left has has scratchy feeling to it for the last 2-3 days, without swelling , other pain, blurred vision, or d/c - just keeps feeling it with eye blinking though he has tried to flush on several occasions.  Pt denies chest pain, increased sob or doe, wheezing, orthopnea, PND, increased LE swelling, palpitations, dizziness or syncope.  Pt denies new neurological symptoms such as new headache, or facial or extremity weakness or numbness   Pt denies polydipsia, polyuria.   Pt denies fever, wt loss, night sweats, loss of appetite, or other constitutional symptoms Past Medical History  Diagnosis Date  . HYPERLIPIDEMIA 12/03/2009  . HYPERTENSION 02/01/2010  . ASTHMA 12/03/2009  . GERD 12/03/2009  . DIVERTICULOSIS, COLON 12/03/2009  . BENIGN PROSTATIC HYPERTROPHY 12/03/2009  . SKIN LESION 12/03/2009  . FATIGUE 12/03/2009  . TREMOR 12/03/2009  . HYPERTENSION NEC 12/03/2009  . MALIGNANT MELANOMA, HX OF 12/03/2009  . TOBACCO USE, QUIT 12/03/2009  . ANEMIA-IRON DEFICIENCY 07/08/2010   Past Surgical History  Procedure Date  . S/p left leg melanoma   . Inguinal herniorrhapy 1970    right    reports that he has quit smoking. He does not have any smokeless tobacco history on file. He reports that he drinks alcohol. He reports that he does not use illicit drugs. family history is not on file. No Known Allergies Current Outpatient Prescriptions on File Prior to Visit  Medication Sig Dispense Refill  . amLODipine (NORVASC) 5 MG tablet Take 5 mg by mouth daily.        [name] aspirin 81 MG tablet Take 81 mg by mouth daily.        . B Complex Vitamins  (B COMPLEX 100 PO) Take by mouth daily.        [name] Bioflavonoid Products (ESTER-C) 500-550 MG TABS Take by mouth daily.        . cholecalciferol (VITAMIN D-400) 400 UNITS TABS Take by mouth daily.        . Cinnamon 500 MG capsule Take 500 mg by mouth daily.        [name] LYCOPENE PO Take by mouth daily.        . Magnesium 200 MG TABS Take by mouth 2 (two) times daily.        . Multiple Vitamin (MULTIVITAMIN) capsule Take 1 capsule by mouth daily.         Review of Systems Review of Systems  Constitutional: Negative for diaphoresis and unexpected weight change.  HENT: Negative for drooling and tinnitus.   Eyes: Negative for photophobia and visual disturbance.  Respiratory: Negative for choking and stridor.   Gastrointestinal: Negative for vomiting and blood in stool.  Genitourinary: Negative for hematuria and decreased urine volume.  Musculoskeletal: Negative for gait problem.  Skin: Negative for color change and wound.  Neurological: Negative for tremors and numbness.  Psychiatric/Behavioral: Negative for decreased concentration. The patient is not hyperactive.       Objective:   Physical Exam BP 130/72  Pulse 56  Temp(Src) 97.5 F (36.4 C) (Oral)  Ht 5\' 8"  (1.727 m)  Wt 170  lb (77.111 kg)  BMI 25.85 kg/m2  SpO2 95% Physical Exam  VS noted, stiff and uncomfortable on standing from chair to sit on exam table Constitutional: Pt appears well-developed and well-nourished.  HENT: Head: Normocephalic. Bilat conjunctiva without erythema, no eye d/c or overt foreign body Right Ear: External ear normal.  Left Ear: External ear normal.  Eyes: Conjunctivae and EOM are normal. Pupils are equal, round, and reactive to light.  Neck: Normal range of motion. Neck supple.  Cardiovascular: Normal rate and regular rhythm.   Pulmonary/Chest: Effort normal and breath sounds normal.  Abd:  Soft, non-distended, + BS with ? Mild tender RUQ, no guarding, no rebound Neurological: Pt is alert. No cranial  nerve deficit.  Skin: Skin is warm. No erythema.  Psychiatric: Pt behavior is normal. Thought content normal. 1+ nervous        Assessment & Plan:

## 2010-09-09 NOTE — Patient Instructions (Signed)
Continue all other medications as before You will be contacted regarding the referral for: ultrasound for the abdomen, and opthomology for the left eye Please return in 3 months, or sooner if needed

## 2010-09-09 NOTE — Assessment & Plan Note (Signed)
Exam benign to me, pt has done mult flushing at home, will refer to optho asap

## 2010-09-09 NOTE — Assessment & Plan Note (Signed)
stable overall by hx and exam, most recent lab reviewed with pt, and pt to continue medical treatment as before  BP Readings from Last 3 Encounters:  09/09/10 130/72  07/08/10 108/64  01/23/10 132/62

## 2010-09-13 ENCOUNTER — Encounter: Payer: Self-pay | Admitting: Internal Medicine

## 2010-09-14 ENCOUNTER — Ambulatory Visit
Admission: RE | Admit: 2010-09-14 | Discharge: 2010-09-14 | Disposition: A | Discharge status: Home / Self Care | Payer: Medicare Other | Source: Ambulatory Visit | Attending: Internal Medicine | Admitting: Internal Medicine

## 2010-09-14 DIAGNOSIS — R1901 Right upper quadrant abdominal swelling, mass and lump: Secondary | ICD-10-CM

## 2010-12-03 ENCOUNTER — Ambulatory Visit: Payer: Medicare Other | Admitting: Internal Medicine

## 2010-12-14 ENCOUNTER — Ambulatory Visit (INDEPENDENT_AMBULATORY_CARE_PROVIDER_SITE_OTHER): Payer: Medicare Other | Admitting: Internal Medicine

## 2010-12-14 ENCOUNTER — Other Ambulatory Visit (INDEPENDENT_AMBULATORY_CARE_PROVIDER_SITE_OTHER): Payer: Medicare Other

## 2010-12-14 ENCOUNTER — Encounter: Payer: Self-pay | Admitting: Internal Medicine

## 2010-12-14 VITALS — BP 140/72 | HR 54 | Temp 98.0°F | Ht 68.0 in | Wt 167.4 lb

## 2010-12-14 DIAGNOSIS — E785 Hyperlipidemia, unspecified: Secondary | ICD-10-CM

## 2010-12-14 DIAGNOSIS — I10 Essential (primary) hypertension: Secondary | ICD-10-CM

## 2010-12-14 DIAGNOSIS — R5381 Other malaise: Secondary | ICD-10-CM

## 2010-12-14 DIAGNOSIS — R5383 Other fatigue: Secondary | ICD-10-CM

## 2010-12-14 LAB — BASIC METABOLIC PANEL
Chloride: 101 mEq/L (ref 96–112)
GFR: 125.15 mL/min (ref >60.00)
Glucose, Bld: 106 mg/dL — ABNORMAL HIGH (ref 70–99)
Potassium: 4.1 mEq/L (ref 3.5–5.1)
Sodium: 137 mEq/L (ref 135–145)

## 2010-12-14 LAB — CBC WITH DIFFERENTIAL/PLATELET
Basophils Absolute: 0 10*3/uL (ref 0.0–0.1)
Eosinophils Absolute: 0.3 10*3/uL (ref 0.0–0.7)
HCT: 44.6 % (ref 39.0–52.0)
Lymphs Abs: 0.8 10*3/uL (ref 0.7–4.0)
MCHC: 34.3 g/dL (ref 30.0–36.0)
MCV: 99 fl (ref 78.0–100.0)
Monocytes Absolute: 0.3 10*3/uL (ref 0.1–1.0)
Neutrophils Relative %: 72.5 % (ref 43.0–77.0)
Platelets: 184 10*3/uL (ref 150.0–400.0)
RDW: 13.4 % (ref 11.5–14.6)

## 2010-12-14 LAB — LIPID PANEL
LDL Cholesterol: 105 mg/dL — ABNORMAL HIGH (ref 0–99)
Total CHOL/HDL Ratio: 4

## 2010-12-14 LAB — HEPATIC FUNCTION PANEL
ALT: 28 U/L (ref 0–53)
Bilirubin, Direct: 0.1 mg/dL (ref 0.0–0.3)
Total Bilirubin: 1.3 mg/dL — ABNORMAL HIGH (ref 0.3–1.2)

## 2010-12-14 MED ORDER — AMLODIPINE BESYLATE 5 MG PO TABS: 5.0000 mg | ORAL_TABLET | Freq: Every day | ORAL | Status: DC

## 2010-12-14 NOTE — Patient Instructions (Addendum)
Continue all other medications as before Please go to LAB in the Basement for the blood and/or urine tests to be done today Please call the phone number [phone] (the [employer] System) for results of testing in 2-3 days;  When calling, simply dial the number, and when prompted enter the MRN number above (the Medical Record Number) and the # key, then the message should start. Please return in 6 months, or sooner if needed 

## 2010-12-14 NOTE — Progress Notes (Signed)
Subjective:    Patient ID: Jason Lane, male    DOB: 1927-12-18, 77 y.o.   MRN: [id number]  HPI Here to f/u; overall doing very well; no complaints;  Tremor to hands is stable, has been somewhat more forgetful but signs of dementia or other ST memory dysfunction.  Pt denies chest pain, increased sob or doe, wheezing, orthopnea, PND, increased LE swelling, palpitations, dizziness or syncope. Pt denies new neurological symptoms such as new headache, or facial or extremity weakness or numbness   Pt denies polydipsia, polyuria  Pt states overall good compliance with meds, trying to follow lower cholesterol  diet, wt overall stable but little exercise however.   Does have sense of ongoing fatigue, but denies signficant hypersomnolence.  Past Medical History  Diagnosis Date  . HYPERLIPIDEMIA 12/03/2009  . HYPERTENSION 02/01/2010  . ASTHMA 12/03/2009  . GERD 12/03/2009  . DIVERTICULOSIS, COLON 12/03/2009  . BENIGN PROSTATIC HYPERTROPHY 12/03/2009  . SKIN LESION 12/03/2009  . FATIGUE 12/03/2009  . TREMOR 12/03/2009  . HYPERTENSION NEC 12/03/2009  . MALIGNANT MELANOMA, HX OF 12/03/2009  . TOBACCO USE, QUIT 12/03/2009  . ANEMIA-IRON DEFICIENCY 07/08/2010   Past Surgical History  Procedure Date  . S/p left leg melanoma   . Inguinal herniorrhapy 1970    right    reports that he has quit smoking. He does not have any smokeless tobacco history on file. He reports that he drinks alcohol. He reports that he does not use illicit drugs. family history is not on file. No Known Allergies Current Outpatient Prescriptions on File Prior to Visit  Medication Sig Dispense Refill  . aspirin 81 MG tablet Take 81 mg by mouth daily.        . B Complex Vitamins (B COMPLEX 100 PO) Take by mouth daily.        [name] Bioflavonoid Products (ESTER-C) 500-550 MG TABS Take by mouth daily.        . cholecalciferol (VITAMIN D-400) 400 UNITS TABS Take by mouth daily.        . Cinnamon 500 MG capsule Take 500 mg by mouth daily.        [name] LYCOPENE  PO Take by mouth daily.        . Magnesium 200 MG TABS Take by mouth 2 (two) times daily.        . Multiple Vitamin (MULTIVITAMIN) capsule Take 1 capsule by mouth daily.         Review of Systems Review of Systems  Constitutional: Negative for diaphoresis and unexpected weight change.  HENT: Negative for drooling and tinnitus.   Eyes: Negative for photophobia and visual disturbance.  Respiratory: Negative for choking and stridor.   Gastrointestinal: Negative for vomiting and blood in stool.  Genitourinary: Negative for hematuria and decreased urine volume.  Musculoskeletal: Negative for gait problem.  Skin: Negative for color change and wound.  Neurological: Negative for tremors and numbness.  Psychiatric/Behavioral: Negative for decreased concentration. The patient is not hyperactive.       Objective:   Physical Exam BP 140/72  Pulse 54  Temp(Src) 98 F (36.7 C) (Oral)  Ht 5\' 8"  (1.727 m)  Wt 167 lb 7 oz (75.949 kg)  BMI 25.46 kg/m2  SpO2 97% Physical Exam  VS noted Constitutional: Pt appears well-developed and well-nourished.  HENT: Head: Normocephalic.  Right Ear: External ear normal.  Left Ear: External ear normal.  Eyes: Conjunctivae and EOM are normal. Pupils are equal, round, and reactive to light.  Neck: Normal range  of motion. Neck supple.  Cardiovascular: Normal rate and regular rhythm.   Pulmonary/Chest: Effort normal and breath sounds normal.  Abd:  Soft, NT, non-distended, + BS Neurological: Pt is alert. No cranial nerve deficit.  Skin: Skin is warm. No erythema.  Psychiatric: Pt behavior is normal. Thought content normal.        Assessment & Plan:

## 2010-12-14 NOTE — Assessment & Plan Note (Signed)
stable overall by hx and exam, most recent data reviewed with pt, and pt to continue medical treatment as before  BP Readings from Last 3 Encounters:  12/14/10 140/72  09/09/10 130/72  07/08/10 108/64

## 2010-12-14 NOTE — Assessment & Plan Note (Signed)
Etiology unclear, Exam otherwise benign, to check labs as documented, follow with expectant management  

## 2010-12-14 NOTE — Assessment & Plan Note (Signed)
stable overall by hx and exam, most recent data reviewed with pt, and pt to continue medical treatment as before  Lab Results  Component Value Date   LDLCALC 117* 12/03/2009

## 2011-06-04 DIAGNOSIS — H2510 Age-related nuclear cataract, unspecified eye: Secondary | ICD-10-CM | POA: Diagnosis not present

## 2011-06-04 DIAGNOSIS — H25019 Cortical age-related cataract, unspecified eye: Secondary | ICD-10-CM | POA: Diagnosis not present

## 2011-06-16 ENCOUNTER — Ambulatory Visit (INDEPENDENT_AMBULATORY_CARE_PROVIDER_SITE_OTHER): Payer: Medicare Other | Admitting: Internal Medicine

## 2011-06-16 ENCOUNTER — Encounter: Payer: Self-pay | Admitting: Internal Medicine

## 2011-06-16 VITALS — BP 126/68 | HR 54 | Temp 97.0°F | Ht 68.0 in | Wt 175.4 lb

## 2011-06-16 DIAGNOSIS — E785 Hyperlipidemia, unspecified: Secondary | ICD-10-CM | POA: Diagnosis not present

## 2011-06-16 DIAGNOSIS — J45909 Unspecified asthma, uncomplicated: Secondary | ICD-10-CM

## 2011-06-16 DIAGNOSIS — I10 Essential (primary) hypertension: Secondary | ICD-10-CM

## 2011-06-16 NOTE — Assessment & Plan Note (Signed)
stable overall by hx and exam, most recent data reviewed with pt, and pt to continue medical treatment as before  Lab Results  Component Value Date   LDLCALC 105* 12/14/2010

## 2011-06-16 NOTE — Progress Notes (Signed)
Subjective:    Patient ID: Jason Lane, male    DOB: April 25, 1928, 78 y.o.   MRN: [id number]  HPI  Here to f/u; overall doing ok,  Pt denies chest pain, increased sob or doe, wheezing, orthopnea, PND, increased LE swelling, palpitations, dizziness or syncope.  Pt denies new neurological symptoms such as new headache, or facial or extremity weakness or numbness   Pt denies polydipsia, polyuria, or low sugar symptoms such as weakness or confusion improved with po intake.  Pt states overall good compliance with meds, trying to follow lower cholesterol diet, wt overall stable but little exercise however. Has some right hand tremor ? Mild worse.  Has some slight ankle edema on the amlodipine but quite tolerable.  Due for left cataract and lens tx. States walks 3 miles in 42 min each day for exercise Past Medical History  Diagnosis Date  . HYPERLIPIDEMIA 12/03/2009  . HYPERTENSION 02/01/2010  . ASTHMA 12/03/2009  . GERD 12/03/2009  . DIVERTICULOSIS, COLON 12/03/2009  . BENIGN PROSTATIC HYPERTROPHY 12/03/2009  . SKIN LESION 12/03/2009  . FATIGUE 12/03/2009  . TREMOR 12/03/2009  . HYPERTENSION NEC 12/03/2009  . MALIGNANT MELANOMA, HX OF 12/03/2009  . TOBACCO USE, QUIT 12/03/2009  . ANEMIA-IRON DEFICIENCY 07/08/2010   Past Surgical History  Procedure Date  . S/p left leg melanoma   . Inguinal herniorrhapy 1970    right    reports that he has quit smoking. He does not have any smokeless tobacco history on file. He reports that he drinks alcohol. He reports that he does not use illicit drugs. family history is not on file. No Known Allergies Current Outpatient Prescriptions on File Prior to Visit  Medication Sig Dispense Refill  . amLODipine (NORVASC) 5 MG tablet Take 1 tablet (5 mg total) by mouth daily.  90 tablet  3  . aspirin 81 MG tablet Take 81 mg by mouth daily.        . B Complex Vitamins (B COMPLEX 100 PO) Take by mouth daily.        [name] Bioflavonoid Products (ESTER-C) 500-550 MG TABS Take by mouth daily.         . cholecalciferol (VITAMIN D-400) 400 UNITS TABS Take by mouth daily.        . Cinnamon 500 MG capsule Take 500 mg by mouth daily.        [name] LYCOPENE PO Take by mouth daily.        . Magnesium 200 MG TABS Take by mouth 2 (two) times daily.        . Multiple Vitamin (MULTIVITAMIN) capsule Take 1 capsule by mouth daily.         Review of Systems Review of Systems  Constitutional: Negative for diaphoresis and unexpected weight change.  HENT: Negative for drooling and tinnitus.   Eyes: Negative for photophobia and visual disturbance.  Respiratory: Negative for choking and stridor.   Gastrointestinal: Negative for vomiting and blood in stool.  Genitourinary: Negative for hematuria and decreased urine volume.     Objective:   Physical Exam BP 126/68  Pulse 54  Temp(Src) 97 F (36.1 C) (Oral)  Ht 5\' 8"  (1.727 m)  Wt 175 lb 6 oz (79.55 kg)  BMI 26.67 kg/m2  SpO2 97% Physical Exam  VS noted Constitutional: Pt appears well-developed and well-nourished.  HENT: Head: Normocephalic.  Right Ear: External ear normal.  Left Ear: External ear normal.  Eyes: Conjunctivae and EOM are normal. Pupils are equal, round, and reactive to light.  Neck: Normal range of motion. Neck supple.  Cardiovascular: Normal rate and regular rhythm.   Pulmonary/Chest: Effort normal and breath sounds normal.  Neurological: Pt is alert. No cranial nerve deficit. mild RUE tremor noted, motor gross intact Skin: Skin is warm. No erythema.  Psychiatric: Pt behavior is normal. Thought content normal. 1+ nervous    Assessment & Plan:

## 2011-06-16 NOTE — Assessment & Plan Note (Signed)
stable overall by hx and exam, most recent data reviewed with pt, and pt to continue medical treatment as before  BP Readings from Last 3 Encounters:  06/16/11 126/68  12/14/10 140/72  09/09/10 130/72

## 2011-06-16 NOTE — Assessment & Plan Note (Signed)
stable overall by hx and exam, most recent data reviewed with pt, and pt to continue medical treatment as before SpO2 Readings from Last 3 Encounters:  06/16/11 97%  12/14/10 97%  09/09/10 95%

## 2011-06-16 NOTE — Patient Instructions (Signed)
Continue all other medications as before Please return in 6 months, or sooner if needed 

## 2011-06-30 DIAGNOSIS — H2510 Age-related nuclear cataract, unspecified eye: Secondary | ICD-10-CM | POA: Diagnosis not present

## 2011-06-30 DIAGNOSIS — H25019 Cortical age-related cataract, unspecified eye: Secondary | ICD-10-CM | POA: Diagnosis not present

## 2011-06-30 DIAGNOSIS — H269 Unspecified cataract: Secondary | ICD-10-CM | POA: Diagnosis not present

## 2011-09-10 ENCOUNTER — Ambulatory Visit (INDEPENDENT_AMBULATORY_CARE_PROVIDER_SITE_OTHER): Payer: Medicare Other | Admitting: Endocrinology

## 2011-09-10 VITALS — BP 128/72 | HR 57 | Temp 98.1°F | Wt 179.0 lb

## 2011-09-10 DIAGNOSIS — R42 Dizziness and giddiness: Secondary | ICD-10-CM | POA: Diagnosis not present

## 2011-09-10 DIAGNOSIS — Z136 Encounter for screening for cardiovascular disorders: Secondary | ICD-10-CM

## 2011-09-10 MED ORDER — MECLIZINE HCL 12.5 MG PO TABS: 12.5000 mg | ORAL_TABLET | Freq: Three times a day (TID) | ORAL | Status: AC | PRN

## 2011-09-10 NOTE — Progress Notes (Signed)
Subjective:    Patient ID: Jason Lane, male    DOB: April 17, 1928, 78 y.o.   MRN: [id number]  HPI Pt states 2 days of intermittent moderate vertigenous-quality dizziness, worse with any change of posture.  he had similar sxs 10 years ago, and meclizine helped.  No assoc loc Past Medical History  Diagnosis Date  . HYPERLIPIDEMIA 12/03/2009  . HYPERTENSION 02/01/2010  . ASTHMA 12/03/2009  . GERD 12/03/2009  . DIVERTICULOSIS, COLON 12/03/2009  . BENIGN PROSTATIC HYPERTROPHY 12/03/2009  . SKIN LESION 12/03/2009  . FATIGUE 12/03/2009  . TREMOR 12/03/2009  . HYPERTENSION NEC 12/03/2009  . MALIGNANT MELANOMA, HX OF 12/03/2009  . TOBACCO USE, QUIT 12/03/2009  . ANEMIA-IRON DEFICIENCY 07/08/2010    Past Surgical History  Procedure Date  . S/p left leg melanoma   . Inguinal herniorrhapy 1970    right    History   Social History  . Marital Status: Single    Spouse Name: N/A    Number of Children: N/A  . Years of Education: N/A   Occupational History  . 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 Smoking status: Former Games developer  . Smokeless tobacco: Not on file   Comment: none since approx 1985  . Alcohol Use: Yes     1 shot per day  . Drug Use: No  . Sexually Active: Not on file   Other Topics Concern  . Not on file   Social History Narrative   Just moved to [place] from [place] in 1948Served in [employer] in [place] with gardening/has a living willNo children/never married    Current Outpatient Prescriptions on File Prior to Visit  Medication Sig Dispense Refill  . amLODipine (NORVASC) 5 MG tablet Take 1 tablet (5 mg total) by mouth daily.  90 tablet  3  . aspirin 81 MG tablet Take 81 mg by mouth daily.        . B Complex Vitamins (B COMPLEX 100 PO) Take by mouth daily.        [name] Bioflavonoid Products (ESTER-C) 500-550 MG TABS Take by mouth daily.        . cholecalciferol (VITAMIN D-400) 400 UNITS TABS Take by mouth daily.        . Cinnamon 500 MG capsule Take 500  mg by mouth daily.        [name] LYCOPENE PO Take by mouth daily.        . Magnesium 200 MG TABS Take by mouth 2 (two) times daily.        . Multiple Vitamin (MULTIVITAMIN) capsule Take 1 capsule by mouth daily.          No Known Allergies  No family history on file.  BP 128/72  Pulse 57  Temp(Src) 98.1 F (36.7 C) (Oral)  Wt 179 lb (81.194 kg)  SpO2 98%  Review of Systems Denies n/v/visual loss/headache/chest pain.      Objective:   Physical Exam VS: see vs page.  No postural change in BP GEN: no distress HEAD: head: no deformity eyes: no periorbital swelling, no proptosis external nose and ears are normal mouth: no lesion seen Both eac's and tm's are normal NECK: supple, thyroid is not enlarged CHEST WALL: no deformity LUNGS: clear to auscultation CV: reg rate and rhythm, no murmur ABD: abdomen is soft, nontender.  no hepatosplenomegaly.  not distended.  no hernia MUSCULOSKELETAL: muscle bulk and strength are grossly normal.  no obvious joint swelling.  gait is normal  and steady EXTEMITIES: no deformity of the hands. no edema PULSES: no carotid bruit NEURO:  cn 2-12 grossly intact.   readily moves all 4's.  sensation is intact to touch on the feet.  i can reproduce the sxs with change of posture, both lying down and sitting up again. SKIN:  Normal texture and temperature.  No rash or suspicious lesion is visible.   NODES:  None palpable at the neck PSYCH: alert, oriented x3.  Does not appear anxious nor depressed.    (i reviewed 2012 labs) i reviewed electrocardiogram    Assessment & Plan:  Vertigo, recurrent.  Pt declines w/u such as labs and head CT Bradycardia, which seems unlikely related to dizziness.  HTN, well-controlled.  This also seems unrelated to dizziness.

## 2011-09-10 NOTE — Patient Instructions (Signed)
i have sent a prescription to your pharmacy, for "meclizine." I hope you feel better soon.  If you don't feel better by next week, please call back.

## 2011-09-11 DIAGNOSIS — R42 Dizziness and giddiness: Secondary | ICD-10-CM | POA: Insufficient documentation

## 2011-10-27 DIAGNOSIS — Z85820 Personal history of malignant melanoma of skin: Secondary | ICD-10-CM | POA: Diagnosis not present

## 2011-10-27 DIAGNOSIS — L821 Other seborrheic keratosis: Secondary | ICD-10-CM | POA: Diagnosis not present

## 2011-10-27 DIAGNOSIS — I781 Nevus, non-neoplastic: Secondary | ICD-10-CM | POA: Diagnosis not present

## 2011-12-15 ENCOUNTER — Other Ambulatory Visit (INDEPENDENT_AMBULATORY_CARE_PROVIDER_SITE_OTHER): Payer: Medicare Other

## 2011-12-15 ENCOUNTER — Encounter: Payer: Self-pay | Admitting: Internal Medicine

## 2011-12-15 ENCOUNTER — Ambulatory Visit (INDEPENDENT_AMBULATORY_CARE_PROVIDER_SITE_OTHER): Payer: Medicare Other | Admitting: Internal Medicine

## 2011-12-15 VITALS — BP 120/70 | HR 52 | Temp 97.0°F | Ht 68.0 in | Wt 163.1 lb

## 2011-12-15 DIAGNOSIS — I10 Essential (primary) hypertension: Secondary | ICD-10-CM

## 2011-12-15 DIAGNOSIS — K921 Melena: Secondary | ICD-10-CM | POA: Diagnosis not present

## 2011-12-15 DIAGNOSIS — R209 Unspecified disturbances of skin sensation: Secondary | ICD-10-CM

## 2011-12-15 DIAGNOSIS — R202 Paresthesia of skin: Secondary | ICD-10-CM

## 2011-12-15 DIAGNOSIS — N320 Bladder-neck obstruction: Secondary | ICD-10-CM

## 2011-12-15 DIAGNOSIS — R252 Cramp and spasm: Secondary | ICD-10-CM | POA: Diagnosis not present

## 2011-12-15 DIAGNOSIS — E785 Hyperlipidemia, unspecified: Secondary | ICD-10-CM

## 2011-12-15 LAB — BASIC METABOLIC PANEL
BUN: 18 mg/dL (ref 6–23)
Calcium: 9.3 mg/dL (ref 8.4–10.5)
GFR: 127.1 mL/min (ref >60.00)
Potassium: 4.9 mEq/L (ref 3.5–5.1)
Sodium: 139 mEq/L (ref 135–145)

## 2011-12-15 LAB — CBC WITH DIFFERENTIAL/PLATELET
Basophils Relative: 0.4 % (ref 0.0–3.0)
Eosinophils Relative: 9.3 % — ABNORMAL HIGH (ref 0.0–5.0)
HCT: 42.9 % (ref 39.0–52.0)
Hemoglobin: 14.2 g/dL (ref 13.0–17.0)
Lymphs Abs: 0.8 10*3/uL (ref 0.7–4.0)
MCV: 98.9 fl (ref 78.0–100.0)
Monocytes Absolute: 0.3 10*3/uL (ref 0.1–1.0)
Monocytes Relative: 7.5 % (ref 3.0–12.0)
Neutro Abs: 2.7 10*3/uL (ref 1.4–7.7)
Platelets: 179 10*3/uL (ref 150.0–400.0)
WBC: 4.1 10*3/uL — ABNORMAL LOW (ref 4.5–10.5)

## 2011-12-15 LAB — URINALYSIS, ROUTINE W REFLEX MICROSCOPIC
Hgb urine dipstick: NEGATIVE
Ketones, ur: NEGATIVE
Urine Glucose: NEGATIVE
Urobilinogen, UA: 0.2 (ref 0.0–1.0)

## 2011-12-15 LAB — PROTIME-INR
INR: 1.2 ratio — ABNORMAL HIGH (ref 0.8–1.0)
Prothrombin Time: 13.2 s — ABNORMAL HIGH (ref 10.2–12.4)

## 2011-12-15 LAB — HEPATIC FUNCTION PANEL
AST: 24 U/L (ref 0–37)
Albumin: 4 g/dL (ref 3.5–5.2)
Total Bilirubin: 0.9 mg/dL (ref 0.3–1.2)

## 2011-12-15 LAB — LIPID PANEL
HDL: 41.2 mg/dL (ref >39.00)
LDL Cholesterol: 99 mg/dL (ref 0–99)
Total CHOL/HDL Ratio: 4
Triglycerides: 65 mg/dL (ref 0.0–149.0)

## 2011-12-15 MED ORDER — AMLODIPINE BESYLATE 5 MG PO TABS: 5.0000 mg | ORAL_TABLET | Freq: Every day | ORAL | Status: DC

## 2011-12-15 NOTE — Patient Instructions (Addendum)
Continue all other medications as before Your medication was refilled today Please go to LAB in the Basement for the blood and/or urine tests to be done today, including the magnesium You will be contacted by phone if any changes need to be made immediately.  Otherwise, you will receive a letter about your results with an explanation. You will be contacted regarding the referral for: Gastroenterology (for the bleeding) Please return in 6 months, or sooner if needed

## 2011-12-19 ENCOUNTER — Encounter: Payer: Self-pay | Admitting: Internal Medicine

## 2011-12-19 ENCOUNTER — Other Ambulatory Visit: Payer: Self-pay | Admitting: Internal Medicine

## 2011-12-19 DIAGNOSIS — G629 Polyneuropathy, unspecified: Secondary | ICD-10-CM | POA: Insufficient documentation

## 2011-12-19 NOTE — Progress Notes (Signed)
Subjective:    Patient ID: Jason Lane, male    DOB: April 10, 1928, 78 y.o.   MRN: [id number]  HPI  Here for f/u;  Overall doing ok;  Pt denies CP, worsening SOB, DOE, wheezing, orthopnea, PND, worsening LE edema, palpitations, dizziness or syncope.  Pt denies neurological change such as new Headache, facial or extremity weakness.  Pt denies polydipsia, polyuria, or low sugar symptoms. Pt states overall good compliance with treatment and medications, good tolerability, and trying to follow lower cholesterol diet.  Pt denies worsening depressive symptoms, suicidal ideation or panic. No fever, wt loss, night sweats, loss of appetite, or other constitutional symptoms.  Pt states good ability with ADL's, low fall risk, home safety reviewed and adequate, no significant changes in hearing or vision, and occasionally active with exercise.  Has had several episodes of small volume painless BRBPR in the past month. Also mentions distal toes have been numb in the AM mostly it seems, less later in the day, denies low back pain, radicular pain or LE weakness. Has had several nocturnal leg cramps, and asks for Magnesium cbeck, no longer on a magnesium supplement as he had in the past.   Past Medical History  Diagnosis Date  . HYPERLIPIDEMIA 12/03/2009  . HYPERTENSION 02/01/2010  . ASTHMA 12/03/2009  . GERD 12/03/2009  . DIVERTICULOSIS, COLON 12/03/2009  . BENIGN PROSTATIC HYPERTROPHY 12/03/2009  . SKIN LESION 12/03/2009  . FATIGUE 12/03/2009  . TREMOR 12/03/2009  . HYPERTENSION NEC 12/03/2009  . MALIGNANT MELANOMA, HX OF 12/03/2009  . TOBACCO USE, QUIT 12/03/2009  . ANEMIA-IRON DEFICIENCY 07/08/2010   Past Surgical History  Procedure Date  . S/p left leg melanoma   . Inguinal herniorrhapy 1970    right    reports that he has quit smoking. He does not have any smokeless tobacco history on file. He reports that he drinks alcohol. He reports that he does not use illicit drugs. family history is not on file. No Known  Allergies Current Outpatient Prescriptions on File Prior to Visit  Medication Sig Dispense Refill  . amLODipine (NORVASC) 5 MG tablet Take 1 tablet (5 mg total) by mouth daily.  90 tablet  3  . B Complex Vitamins (B COMPLEX 100 PO) Take by mouth daily.        [name] Bioflavonoid Products (ESTER-C) 500-550 MG TABS Take by mouth daily.        . cholecalciferol (VITAMIN D-400) 400 UNITS TABS Take by mouth daily.        . Cinnamon 500 MG capsule Take 500 mg by mouth daily.        [name] LYCOPENE PO Take by mouth daily.        . Multiple Vitamin (MULTIVITAMIN) capsule Take 1 capsule by mouth daily.        [name] aspirin 81 MG tablet Take 81 mg by mouth daily.        . Magnesium 200 MG TABS Take by mouth 2 (two) times daily.         Review of Systems Review of Systems  Constitutional: Negative for diaphoresis and unexpected weight change.  HENT: Negative for drooling and tinnitus.   Eyes: Negative for photophobia and visual disturbance.  Respiratory: Negative for choking and stridor.   Gastrointestinal: Negative for vomiting and blood in stool.  Genitourinary: Negative for hematuria and decreased urine volume.  Musculoskeletal: Negative for gait problem.  Skin: Negative for color change and wound.  Neurological: Negative for tremors.  Psychiatric/Behavioral: Negative for decreased concentration.  The patient is not hyperactive.      Objective:   Physical Exam BP 120/70  Pulse 52  Temp 97 F (36.1 C) (Oral)  Ht 5\' 8"  (1.727 m)  Wt 163 lb 2 oz (73.993 kg)  BMI 24.80 kg/m2  SpO2 97% Physical Exam  VS noted Constitutional: Pt appears well-developed and well-nourished.  HENT: Head: Normocephalic.  Right Ear: External ear normal.  Left Ear: External ear normal.  Eyes: Conjunctivae and EOM are normal. Pupils are equal, round, and reactive to light.  Neck: Normal range of motion. Neck supple.  Cardiovascular: Normal rate and regular rhythm.   Pulmonary/Chest: Effort normal and breath sounds normal.   Abd:  Soft, NT, non-distended, + BS Neurological: Pt is alert. No cranial nerve deficit. Motor/dtr intact, mild decreased sens to LT toes bilat, gait intact, has small tremor right hand  Skin: Skin is warm. No erythema.  Psychiatric: Pt behavior is normal. Thought content normal.     Assessment & Plan:

## 2011-12-19 NOTE — Assessment & Plan Note (Signed)
stable overall by hx and exam, most recent data reviewed with pt, and pt to continue medical treatment as before Lab Results  Component Value Date   LDLCALC 99 12/15/2011

## 2011-12-19 NOTE — Assessment & Plan Note (Addendum)
Small volume, for GI referral, for labs today as well

## 2011-12-19 NOTE — Assessment & Plan Note (Signed)
Also for PSA pet pt request

## 2011-12-19 NOTE — Assessment & Plan Note (Signed)
stable overall by hx and exam, most recent data reviewed with pt, and pt to continue medical treatment as before BP Readings from Last 3 Encounters:  12/15/11 120/70  09/10/11 128/72  06/16/11 126/68

## 2011-12-19 NOTE — Assessment & Plan Note (Signed)
For labs including mg, declines other tx such as muscle relaxer

## 2011-12-19 NOTE — Assessment & Plan Note (Addendum)
C/w prob periph neuropathy, possibly age related, declines EMG/NCS

## 2012-01-17 ENCOUNTER — Encounter: Payer: Self-pay | Admitting: Internal Medicine

## 2012-02-09 ENCOUNTER — Encounter: Payer: Self-pay | Admitting: Internal Medicine

## 2012-02-09 ENCOUNTER — Ambulatory Visit (INDEPENDENT_AMBULATORY_CARE_PROVIDER_SITE_OTHER): Payer: Medicare Other | Admitting: Internal Medicine

## 2012-02-09 VITALS — BP 150/72 | HR 60 | Ht 67.0 in | Wt 164.6 lb

## 2012-02-09 DIAGNOSIS — K625 Hemorrhage of anus and rectum: Secondary | ICD-10-CM | POA: Diagnosis not present

## 2012-02-09 DIAGNOSIS — R1031 Right lower quadrant pain: Secondary | ICD-10-CM

## 2012-02-09 DIAGNOSIS — Z800 Family history of malignant neoplasm of digestive organs: Secondary | ICD-10-CM | POA: Diagnosis not present

## 2012-02-09 MED ORDER — MOVIPREP 100 G PO SOLR: 1.0000 | Freq: Once | ORAL | Status: DC

## 2012-02-09 NOTE — Patient Instructions (Addendum)
You have been scheduled for a colonoscopy with propofol. Please follow written instructions given to you at your visit today.  Please pick up your prep kit at the pharmacy within the next 1-3 days. If you use inhalers (even only as needed), please bring them with you on the day of your procedure.  

## 2012-02-09 NOTE — Progress Notes (Signed)
HISTORY OF PRESENT ILLNESS:  Jason Lane is a 78 y.o. male with the below listed medical history who is referred today regarding rectal bleeding. Patient reports having had significant rectal bleeding about 2 months ago. This lasted about 4 days with gradual improvement. No recurrence. He denied any associated abdominal pain, rectal pain, or change in bowel habits. There is a family history of colon cancer in his father at age 36. The patient states that he underwent colonoscopy 8-10 years ago while living in [facility]. He cannot recall the physician's name or the results of the exam. He reports good appetite and stable weight without nausea or vomiting. Occasional acid reflux. Finally, he mentions right-sided discomfort, not necessarily abdominal. He has had this off-and-on for several years. He relates to certain activities such as vigorous yardwork. The patient is quite fit and active. He does not take blood thinners. Review of outside records from August 2013 shows normal comprehensive metabolic panel and normal CBC with hemoglobin of 14.2. Abdominal ultrasound in May of 2012 to evaluate right-sided pain was unremarkable. Upper quadrant lipoma superficially noted as well as probable small hemangioma of the right liver.  REVIEW OF SYSTEMS:  All non-GI ROS negative except for intermittent leg cramps  Past Medical History  Diagnosis Date  . HYPERLIPIDEMIA 12/03/2009  . HYPERTENSION 02/01/2010  . ASTHMA 12/03/2009  . GERD 12/03/2009  . DIVERTICULOSIS, COLON 12/03/2009  . BENIGN PROSTATIC HYPERTROPHY 12/03/2009  . SKIN LESION 12/03/2009  . FATIGUE 12/03/2009  . TREMOR 12/03/2009  . HYPERTENSION NEC 12/03/2009  . MALIGNANT MELANOMA, HX OF 12/03/2009  . TOBACCO USE, QUIT 12/03/2009  . ANEMIA-IRON DEFICIENCY 07/08/2010    Past Surgical History  Procedure Date  . S/p left leg melanoma   . Inguinal herniorrhapy 1970    right    Social History Jason Lane  reports that he has quit smoking. He has  never used smokeless tobacco. He reports that he drinks alcohol. He reports that he does not use illicit drugs.  family history includes Colon cancer in his father.  No Known Allergies     PHYSICAL EXAMINATION: Vital signs: BP 150/72  Pulse 60  Ht 5\' 7"  (1.702 m)  Wt 164 lb 9.6 oz (74.662 kg)  BMI 25.78 kg/m2  Constitutional: generally well-appearing, no acute distress Psychiatric: alert and oriented x3, cooperative Eyes: extraocular movements intact, anicteric, conjunctiva pink Mouth: oral pharynx moist, no lesions Neck: supple no lymphadenopathy Cardiovascular: heart regular rate and rhythm, no murmur Lungs: clear to auscultation bilaterally Abdomen: soft, nontender, nondistended, no obvious ascites, no peritoneal signs, normal bowel sounds, no organomegaly Rectal:deferred until colonoscopy Extremities: no lower extremity edema bilaterally Skin: no lesions on visible extremities Neuro: No focal deficits.   ASSESSMENT:  #1. Recent episode of rectal bleeding of 4 days duration. Etiology unclear #2. Family history of colon cancer in his father at age 64 #3. Minor general medical problems. Stable   PLAN:  #1. Colonoscopy.The nature of the procedure, as well as the risks, benefits, and alternatives were carefully and thoroughly reviewed with the patient. Ample time for discussion and questions allowed. The patient understood, was satisfied, and agreed to proceed. Movi prep prescribed. The patient instructed on its use #2. Chronic intermittent right-sided pain. Likely musculoskeletal. Not felt to be abdominal

## 2012-02-22 ENCOUNTER — Ambulatory Visit (AMBULATORY_SURGERY_CENTER): Payer: Medicare Other | Admitting: Internal Medicine

## 2012-02-22 ENCOUNTER — Encounter: Payer: Self-pay | Admitting: Internal Medicine

## 2012-02-22 VITALS — BP 114/57 | HR 50 | Temp 96.0°F | Resp 44 | Ht 67.0 in | Wt 164.0 lb

## 2012-02-22 DIAGNOSIS — K5730 Diverticulosis of large intestine without perforation or abscess without bleeding: Secondary | ICD-10-CM

## 2012-02-22 DIAGNOSIS — D126 Benign neoplasm of colon, unspecified: Secondary | ICD-10-CM

## 2012-02-22 DIAGNOSIS — K625 Hemorrhage of anus and rectum: Secondary | ICD-10-CM

## 2012-02-22 DIAGNOSIS — Z1211 Encounter for screening for malignant neoplasm of colon: Secondary | ICD-10-CM | POA: Diagnosis not present

## 2012-02-22 DIAGNOSIS — R109 Unspecified abdominal pain: Secondary | ICD-10-CM | POA: Diagnosis not present

## 2012-02-22 DIAGNOSIS — Z800 Family history of malignant neoplasm of digestive organs: Secondary | ICD-10-CM

## 2012-02-22 MED ORDER — SODIUM CHLORIDE 0.9 % IV SOLN: 500.0000 mL | INTRAVENOUS | Status: DC

## 2012-02-22 NOTE — Patient Instructions (Addendum)
Handouts were placed in your envelope for polyps, diverticulosis, high fiber diet and hemorrhoids.  You may resume your current medications today.  Please call if any questions or concerns.    YOU HAD AN ENDOSCOPIC PROCEDURE TODAY AT THE [facility]: Refer to the procedure report that was given to you for any specific questions about what was found during the examination.  If the procedure report does not answer your questions, please call your gastroenterologist to clarify.  If you requested that your care partner not be given the details of your procedure findings, then the procedure report has been included in a sealed envelope for you to review at your convenience later.  YOU SHOULD EXPECT: Some feelings of bloating in the abdomen. Passage of more gas than usual.  Walking can help get rid of the air that was put into your GI tract during the procedure and reduce the bloating. If you had a lower endoscopy (such as a colonoscopy or flexible sigmoidoscopy) you may notice spotting of blood in your stool or on the toilet paper. If you underwent a bowel prep for your procedure, then you may not have a normal bowel movement for a few days.  DIET: Your first meal following the procedure should be a light meal and then it is ok to progress to your normal diet.  A half-sandwich or bowl of soup is an example of a good first meal.  Heavy or fried foods are harder to digest and may make you feel nauseous or bloated.  Likewise meals heavy in dairy and vegetables can cause extra gas to form and this can also increase the bloating.  Drink plenty of fluids but you should avoid alcoholic beverages for 24 hours.  ACTIVITY: Your care partner should take you home directly after the procedure.  You should plan to take it easy, moving slowly for the rest of the day.  You can resume normal activity the day after the procedure however you should NOT DRIVE or use heavy machinery for 24 hours (because of the  sedation medicines used during the test).    SYMPTOMS TO REPORT IMMEDIATELY: A gastroenterologist can be reached at any hour.  During normal business hours, 8:30 AM to 5:00 PM Monday through Friday, call [phone].  After hours and on weekends, please call the GI answering service at [phone] who will take a message and have the physician on call contact you.   Following lower endoscopy (colonoscopy or flexible sigmoidoscopy):  Excessive amounts of blood in the stool  Significant tenderness or worsening of abdominal pains  Swelling of the abdomen that is new, acute  Fever of 100F or higher    FOLLOW UP: If any biopsies were taken you will be contacted by phone or by letter within the next 1-3 weeks.  Call your gastroenterologist if you have not heard about the biopsies in 3 weeks.  Our staff will call the home number listed on your records the next business day following your procedure to check on you and address any questions or concerns that you may have at that time regarding the information given to you following your procedure. This is a courtesy call and so if there is no answer at the home number and we have not heard from you through the emergency physician on call, we will assume that you have returned to your regular daily activities without incident.  SIGNATURES/CONFIDENTIALITY: You and/or your care partner have signed paperwork which will be entered into  your electronic medical record.  These signatures attest to the fact that that the information above on your After Visit Summary has been reviewed and is understood.  Full responsibility of the confidentiality of this discharge information lies with you and/or your care-partner.

## 2012-02-22 NOTE — Op Note (Signed)
[facility] [address] [place], [address]   COLONOSCOPY PROCEDURE REPORT  PATIENT: Jason, Lane  MR#: [id number] BIRTHDATE: [birth year] , 84  yrs. old GENDER: Male ENDOSCOPIST: Roxy Cedar, MD REFERRED QI:ONGEX Deette Revak, M.D. PROCEDURE DATE:  02/22/2012 PROCEDURE:   Colonoscopy with snare polypectomy     x 3 ASA CLASS:   Class II INDICATIONS:rectal bleeding. MEDICATIONS: MAC sedation, administered by CRNA and propofol (Diprivan) 160mg  IV  DESCRIPTION OF PROCEDURE:   After the risks benefits and alternatives of the procedure were thoroughly explained, informed consent was obtained.  A digital rectal exam revealed no abnormalities of the rectum.   The LB CF-[id number]  endoscope was introduced through the anus and advanced to the cecum, which was identified by both the appendix and ileocecal valve. No adverse events experienced.   The quality of the prep was good, using MoviPrep  The instrument was then slowly withdrawn as the colon was fully examined.      COLON FINDINGS: The mucosa appeared normal in the terminal ileum. Three polyps ranging between 3-81mm in size were found in the ascending colon.  A polypectomy was performed with a cold snare. The resection was complete and the polyp tissue was completely retrieved.   Moderate diverticulosis was noted in the descending colon and sigmoid colon.   The colon mucosa was otherwise normal. Retroflexed views revealed internal hemorrhoids. The time to cecum=3 minutes 0 seconds.  Withdrawal time=13 minutes 10 seconds. The scope was withdrawn and the procedure completed. COMPLICATIONS: There were no complications.  ENDOSCOPIC IMPRESSION: 1.   Normal mucosa in the terminal ileum 2.   Three polyps ranging between 3-3mm in size were found in the ascending colon; polypectomy was performed with a cold snare 3.   Moderate diverticulosis was noted in the descending colon and sigmoid colon 4.   The colon mucosa  was otherwise normal . Moderate internal hemorrhoids  RECOMMENDATIONS: 1.  High fiber diet 2.  Return to the care of your primary provider.  GI follow up as needed   eSigned:  Roxy Cedar, MD 02/22/2012 11:47 AM   cc: Corwin Levins, MD and The Patient   PATIENT NAME:  Jason, Lane MR#: [id number]

## 2012-02-22 NOTE — Progress Notes (Signed)
[facility] pt Dr. Marina Goodell spoke with the pt about the findings from the colonoscopy today.  I went over discharge instructions with the pt.  Papers were placed in a sealed envelope.  I went over diet and sx to call and report post procedure with the pt's wife.  No complaints noted in the recovery room. Maw   Patient did not experience any of the following events: a burn prior to discharge; a fall within the facility; wrong site/side/patient/procedure/implant event; or a hospital transfer or hospital admission upon discharge from the facility. [phone]) Patient did not have preoperative order for IV antibiotic SSI prophylaxis. [phone])

## 2012-02-23 ENCOUNTER — Telehealth: Payer: Self-pay | Admitting: *Deleted

## 2012-02-23 NOTE — Telephone Encounter (Signed)
  Follow up Call-  Call back number 02/22/2012  Post procedure Call Back phone  # [phone]  Permission to leave phone message No     Patient questions:  Do you have a fever, pain , or abdominal swelling? no Pain Score  0 *  Have you tolerated food without any problems? yes  Have you been able to return to your normal activities? yes  Do you have any questions about your discharge instructions: Diet   no Medications  no Follow up visit  no  Do you have questions or concerns about your Care? no  Actions: * If pain score is 4 or above: No action needed, pain <4.

## 2012-02-28 ENCOUNTER — Encounter: Payer: Self-pay | Admitting: Internal Medicine

## 2012-06-16 ENCOUNTER — Ambulatory Visit: Payer: Medicare Other | Admitting: Internal Medicine

## 2012-06-23 ENCOUNTER — Encounter: Payer: Self-pay | Admitting: Internal Medicine

## 2012-06-23 ENCOUNTER — Ambulatory Visit (INDEPENDENT_AMBULATORY_CARE_PROVIDER_SITE_OTHER): Payer: Medicare Other | Admitting: Internal Medicine

## 2012-06-23 ENCOUNTER — Ambulatory Visit (INDEPENDENT_AMBULATORY_CARE_PROVIDER_SITE_OTHER)
Admission: RE | Admit: 2012-06-23 | Discharge: 2012-06-23 | Disposition: A | Discharge status: Home / Self Care | Payer: Medicare Other | Source: Ambulatory Visit | Attending: Internal Medicine | Admitting: Internal Medicine

## 2012-06-23 VITALS — BP 132/70 | HR 50 | Temp 97.4°F | Ht 68.0 in | Wt 159.0 lb

## 2012-06-23 DIAGNOSIS — E785 Hyperlipidemia, unspecified: Secondary | ICD-10-CM | POA: Diagnosis not present

## 2012-06-23 DIAGNOSIS — R079 Chest pain, unspecified: Secondary | ICD-10-CM

## 2012-06-23 DIAGNOSIS — I10 Essential (primary) hypertension: Secondary | ICD-10-CM

## 2012-06-23 DIAGNOSIS — J449 Chronic obstructive pulmonary disease, unspecified: Secondary | ICD-10-CM | POA: Diagnosis not present

## 2012-06-23 NOTE — Assessment & Plan Note (Addendum)
ECG reviewed as per emr, I think probable MSK, for CXR today, I encouraged stress testing or card eval given his age and risk but he declines

## 2012-06-23 NOTE — Progress Notes (Signed)
Subjective:    Patient ID: Jason Lane, male    DOB: 10-Aug-1927, 79 y.o.   MRN: [id number]  HPI  Here to f/u, c/o lower mid sternal mild intermittent dull CP for several days, only occurs with lying flat on his back, and resolves with turning to the left or right, has not tried antacids but does not feel it c/w heartburn/reflux and Denies worsening reflux, abd pain, dysphagia, n/v, bowel change or blood (night or day).  No assoc symptoms - Pt denies increased sob or doe, wheezing, orthopnea, PND, increased LE swelling, palpitations, dizziness or syncope.  No fever, cough, ST.   Pt denies new neurological symptoms such as new headache, or facial or extremity weakness or numbness   Pt denies polydipsia, polyuria Past Medical History  Diagnosis Date  . HYPERLIPIDEMIA 12/03/2009  . HYPERTENSION 02/01/2010  . ASTHMA 12/03/2009  . GERD 12/03/2009  . DIVERTICULOSIS, COLON 12/03/2009  . BENIGN PROSTATIC HYPERTROPHY 12/03/2009  . SKIN LESION 12/03/2009  . FATIGUE 12/03/2009  . TREMOR 12/03/2009  . HYPERTENSION NEC 12/03/2009  . MALIGNANT MELANOMA, HX OF 12/03/2009  . TOBACCO USE, QUIT 12/03/2009  . ANEMIA-IRON DEFICIENCY 07/08/2010   Past Surgical History  Procedure Laterality Date  . S/p left leg melanoma    . Inguinal herniorrhapy  1970    right    reports that he has quit smoking. He has never used smokeless tobacco. He reports that he drinks about 4.2 ounces of alcohol per week. He reports that he does not use illicit drugs. family history includes Colon cancer in his father. No Known Allergies Current Outpatient Prescriptions on File Prior to Visit  Medication Sig Dispense Refill  . amLODipine (NORVASC) 5 MG tablet TAKE 1 TABLET DAILY  90 tablet  3  . B Complex Vitamins (B COMPLEX 100 PO) Take by mouth daily.        . cholecalciferol (VITAMIN D-400) 400 UNITS TABS Take by mouth daily.        . Cinnamon 500 MG capsule Take 500 mg by mouth daily.        . Multiple Vitamin (MULTIVITAMIN) capsule Take 1 capsule  by mouth daily.        [name] NETTLE-PYGEUM AFRICANUM PO Take 1 capsule by mouth daily.      . saw palmetto 500 MG capsule Take 500 mg by mouth daily.       No current facility-administered medications on file prior to visit.   Review of Systems  Constitutional: Negative for unexpected weight change, or unusual diaphoresis  HENT: Negative for tinnitus.   Eyes: Negative for photophobia and visual disturbance.  Respiratory: Negative for choking and stridor.   Gastrointestinal: Negative for vomiting and blood in stool.  Genitourinary: Negative for hematuria and decreased urine volume.  Musculoskeletal: Negative for acute joint swelling Skin: Negative for color change and wound.  Neurological: Negative for tremors and numbness other than noted  Psychiatric/Behavioral: Negative for decreased concentration or  hyperactivity.       Objective:   Physical Exam BP 132/70  Pulse 50  Temp(Src) 97.4 F (36.3 C) (Oral)  Ht 5\' 8"  (1.727 m)  Wt 159 lb (72.122 kg)  BMI 24.18 kg/m2  SpO2 97% VS noted,  Constitutional: Pt appears well-developed and well-nourished.  HENT: Head: NCAT.  Right Ear: External ear normal.  Left Ear: External ear normal.  Eyes: Conjunctivae and EOM are normal. Pupils are equal, round, and reactive to light.  Neck: Normal range of motion. Neck supple.  Cardiovascular: Normal  rate and regular rhythm.   Pulmonary/Chest: Effort normal and breath sounds normal.  Abd:  Soft, NT, non-distended, + BS Neurological: Pt is alert. Not confused , has chronic HOH since shelled in [place] Skin: Skin is warm. No erythema.  Psychiatric: Pt behavior is normal. Thought content normal.     Assessment & Plan:

## 2012-06-23 NOTE — Patient Instructions (Addendum)
Please continue all other medications as before, and refills have been done if requested. Please have the pharmacy call with any other refills you may need. Please go to the XRAY Department in the Basement (go straight as you get off the elevator) for the x-ray testing You will be contacted by phone if any changes need to be made immediately.  Otherwise, you will receive a letter about your results with an explanation Please remember to sign up for My Chart if you have not done so, as this will be important to you in the future with finding out test results, communicating by private email, and scheduling acute appointments online when needed. Please return in 6 months, or sooner if needed

## 2012-06-24 NOTE — Assessment & Plan Note (Signed)
stable overall by history and exam, recent data reviewed with pt, and pt to continue medical treatment as before,  to f/u any worsening symptoms or concerns BP Readings from Last 3 Encounters:  06/23/12 132/70  02/22/12 114/57  02/09/12 150/72

## 2012-06-24 NOTE — Assessment & Plan Note (Signed)
stable overall by history and exam, recent data reviewed with pt, and pt to continue medical treatment as before,  to f/u any worsening symptoms or concerns, to cont lower chol diet Lab Results  Component Value Date   LDLCALC 99 12/15/2011

## 2012-07-24 DIAGNOSIS — S51809A Unspecified open wound of unspecified forearm, initial encounter: Secondary | ICD-10-CM | POA: Diagnosis not present

## 2012-08-07 DIAGNOSIS — H5200 Hypermetropia, unspecified eye: Secondary | ICD-10-CM | POA: Diagnosis not present

## 2012-08-07 DIAGNOSIS — H2510 Age-related nuclear cataract, unspecified eye: Secondary | ICD-10-CM | POA: Diagnosis not present

## 2012-08-07 DIAGNOSIS — H25019 Cortical age-related cataract, unspecified eye: Secondary | ICD-10-CM | POA: Diagnosis not present

## 2012-08-07 DIAGNOSIS — Z961 Presence of intraocular lens: Secondary | ICD-10-CM | POA: Diagnosis not present

## 2012-12-15 ENCOUNTER — Encounter: Payer: Self-pay | Admitting: Internal Medicine

## 2012-12-15 ENCOUNTER — Ambulatory Visit (INDEPENDENT_AMBULATORY_CARE_PROVIDER_SITE_OTHER): Payer: Medicare Other | Admitting: Internal Medicine

## 2012-12-15 ENCOUNTER — Other Ambulatory Visit (INDEPENDENT_AMBULATORY_CARE_PROVIDER_SITE_OTHER): Payer: Medicare Other

## 2012-12-15 VITALS — BP 132/68 | HR 52 | Temp 97.2°F | Ht 68.0 in | Wt 160.0 lb

## 2012-12-15 DIAGNOSIS — G629 Polyneuropathy, unspecified: Secondary | ICD-10-CM

## 2012-12-15 DIAGNOSIS — I10 Essential (primary) hypertension: Secondary | ICD-10-CM

## 2012-12-15 DIAGNOSIS — J45909 Unspecified asthma, uncomplicated: Secondary | ICD-10-CM | POA: Diagnosis not present

## 2012-12-15 DIAGNOSIS — E785 Hyperlipidemia, unspecified: Secondary | ICD-10-CM

## 2012-12-15 DIAGNOSIS — G609 Hereditary and idiopathic neuropathy, unspecified: Secondary | ICD-10-CM

## 2012-12-15 LAB — CBC WITH DIFFERENTIAL/PLATELET
Basophils Relative: 0.4 % (ref 0.0–3.0)
Eosinophils Relative: 6.8 % — ABNORMAL HIGH (ref 0.0–5.0)
HCT: 40.9 % (ref 39.0–52.0)
Hemoglobin: 13.9 g/dL (ref 13.0–17.0)
Lymphs Abs: 0.6 10*3/uL — ABNORMAL LOW (ref 0.7–4.0)
MCV: 97.3 fl (ref 78.0–100.0)
Monocytes Absolute: 0.3 10*3/uL (ref 0.1–1.0)
Neutro Abs: 3 10*3/uL (ref 1.4–7.7)
RBC: 4.2 Mil/uL — ABNORMAL LOW (ref 4.22–5.81)
WBC: 4.2 10*3/uL — ABNORMAL LOW (ref 4.5–10.5)

## 2012-12-15 LAB — BASIC METABOLIC PANEL
BUN: 22 mg/dL (ref 6–23)
Creatinine, Ser: 0.7 mg/dL (ref 0.4–1.5)
GFR: 114.33 mL/min (ref >60.00)
Potassium: 4.3 mEq/L (ref 3.5–5.1)

## 2012-12-15 LAB — LIPID PANEL
Cholesterol: 126 mg/dL (ref 0–200)
LDL Cholesterol: 80 mg/dL (ref 0–99)
Total CHOL/HDL Ratio: 5
Triglycerides: 93 mg/dL (ref 0.0–149.0)
VLDL: 18.6 mg/dL (ref 0.0–40.0)

## 2012-12-15 LAB — HEPATIC FUNCTION PANEL: Total Bilirubin: 1 mg/dL (ref 0.3–1.2)

## 2012-12-15 LAB — URINALYSIS, ROUTINE W REFLEX MICROSCOPIC
Bilirubin Urine: NEGATIVE
Ketones, ur: NEGATIVE
Nitrite: NEGATIVE
Total Protein, Urine: NEGATIVE
pH: 6.5 (ref 5.0–8.0)

## 2012-12-15 MED ORDER — AMLODIPINE BESYLATE 5 MG PO TABS: ORAL_TABLET | ORAL | Status: DC

## 2012-12-15 NOTE — Progress Notes (Signed)
Subjective:    Patient ID: Jason Lane, male    DOB: [birth year], 79 y.o.   MRN: [id number]  HPI Here for yearly f/u;  Overall doing ok;  Pt denies CP, worsening SOB, DOE, wheezing, orthopnea, PND, worsening LE edema, palpitations, dizziness or syncope.  Pt denies neurological change such as new headache, facial or extremity weakness.  Pt denies polydipsia, polyuria, or low sugar symptoms. Pt states overall good compliance with treatment and medications, good tolerability, and has been trying to follow lower cholesterol diet.  Pt denies worsening depressive symptoms, suicidal ideation or panic. No fever, night sweats, wt loss, loss of appetite, or other constitutional symptoms.  Pt states good ability with ADL's, has low fall risk, home safety reviewed and adequate, no other significant changes in hearing or vision, and only occasionally active with exercise.  Has some numbness to toes, small tremor to bilat hands, some forgetful, but no dizzy or gait change, can feel off balance slight occasionally. Had some rle swelling mild on 5 mg amlodipine, better with taking half x amlod 5 mg for last 6 wks. Past Medical History  Diagnosis Date  . HYPERLIPIDEMIA 12/03/2009  . HYPERTENSION 02/01/2010  . ASTHMA 12/03/2009  . GERD 12/03/2009  . DIVERTICULOSIS, COLON 12/03/2009  . BENIGN PROSTATIC HYPERTROPHY 12/03/2009  . SKIN LESION 12/03/2009  . FATIGUE 12/03/2009  . TREMOR 12/03/2009  . HYPERTENSION NEC 12/03/2009  . MALIGNANT MELANOMA, HX OF 12/03/2009  . TOBACCO USE, QUIT 12/03/2009  . ANEMIA-IRON DEFICIENCY 07/08/2010   Past Surgical History  Procedure Laterality Date  . S/p left leg melanoma    . Inguinal herniorrhapy  1970    right    reports that he has quit smoking. He has never used smokeless tobacco. He reports that he drinks about 4.2 ounces of alcohol per week. He reports that he does not use illicit drugs. family history includes Colon cancer in his father. No Known Allergies Current Outpatient Prescriptions  on File Prior to Visit  Medication Sig Dispense Refill  . B Complex Vitamins (B COMPLEX 100 PO) Take by mouth daily.        . cholecalciferol (VITAMIN D-400) 400 UNITS TABS Take by mouth daily.        . Cinnamon 500 MG capsule Take 500 mg by mouth daily.        . Multiple Vitamin (MULTIVITAMIN) capsule Take 1 capsule by mouth daily.        [name] NETTLE-PYGEUM AFRICANUM PO Take 1 capsule by mouth daily.      . saw palmetto 500 MG capsule Take 500 mg by mouth daily.       No current facility-administered medications on file prior to visit.   Review of Systems  Constitutional: Negative for unexpected weight change, or unusual diaphoresis  HENT: Negative for tinnitus.   Eyes: Negative for photophobia and visual disturbance.  Respiratory: Negative for choking and stridor.   Gastrointestinal: Negative for vomiting and blood in stool.  Genitourinary: Negative for hematuria and decreased urine volume.  Musculoskeletal: Negative for acute joint swelling Skin: Negative for color change and wound.  Neurological: Negative for tremors and numbness other than noted  Psychiatric/Behavioral: Negative for decreased concentration or  hyperactivity.       Objective:   Physical Exam BP 132/68  Pulse 52  Temp(Src) 97.2 F (36.2 C) (Oral)  Ht 5\' 8"  (1.727 m)  Wt 160 lb (72.576 kg)  BMI 24.33 kg/m2  SpO2 97% VS noted,  Constitutional: Pt appears well-developed and well-nourished.  HENT: Head: NCAT.  Right Ear: External ear normal.  Left Ear: External ear normal.  Eyes: Conjunctivae and EOM are normal. Pupils are equal, round, and reactive to light.  Neck: Normal range of motion. Neck supple.  Cardiovascular: Normal rate and regular rhythm.   Pulmonary/Chest: Effort normal and breath sounds normal.  Abd:  Soft, NT, non-distended, + BS Neurological: Pt is alert. Not confused  Skin: Skin is warm. No erythema.  Psychiatric: Pt behavior is normal. Thought content normal.     Assessment & Plan:

## 2012-12-15 NOTE — Patient Instructions (Signed)
Please continue all other medications as before, and refills have been done if requested - the amlodipine to [facility] Please continue your efforts at being more active, low cholesterol diet, and weight control. You are otherwise up to date with prevention measures today. Please go to the LAB in the Basement (turn left off the elevator) for the tests to be done today You will be contacted by phone if any changes need to be made immediately.  Otherwise, you will receive a letter about your results with an explanation, but please check with MyChart first.  Please remember to sign up for My Chart if you have not done so, as this will be important to you in the future with finding out test results, communicating by private email, and scheduling acute appointments online when needed.  Please return in 1 year for your yearly visit, or sooner if needed

## 2012-12-15 NOTE — Assessment & Plan Note (Addendum)
Also for  b12 , most likely age related/idiopathic

## 2012-12-17 NOTE — Assessment & Plan Note (Signed)
stable overall by history and exam, recent data reviewed with pt, and pt to continue medical treatment as before,  to f/u any worsening symptoms or concerns SpO2 Readings from Last 3 Encounters:  12/15/12 97%  06/23/12 97%  02/22/12 98%

## 2012-12-17 NOTE — Assessment & Plan Note (Signed)
stable overall by history and exam, recent data reviewed with pt, and pt to continue medical treatment as before,  to f/u any worsening symptoms or concerns BP Readings from Last 3 Encounters:  12/15/12 132/68  06/23/12 132/70  02/22/12 114/57

## 2012-12-17 NOTE — Assessment & Plan Note (Signed)
stable overall by history and exam, recent data reviewed with pt, and pt to continue medical treatment as before,  to f/u any worsening symptoms or concerns BP Readings from Last 3 Encounters:  12/15/12 132/68  06/23/12 132/70  02/22/12 114/57    

## 2013-08-20 DIAGNOSIS — H5231 Anisometropia: Secondary | ICD-10-CM | POA: Diagnosis not present

## 2013-08-20 DIAGNOSIS — H2510 Age-related nuclear cataract, unspecified eye: Secondary | ICD-10-CM | POA: Diagnosis not present

## 2013-08-20 DIAGNOSIS — H25019 Cortical age-related cataract, unspecified eye: Secondary | ICD-10-CM | POA: Diagnosis not present

## 2013-08-20 DIAGNOSIS — H5200 Hypermetropia, unspecified eye: Secondary | ICD-10-CM | POA: Diagnosis not present

## 2013-09-06 DIAGNOSIS — M6530 Trigger finger, unspecified finger: Secondary | ICD-10-CM | POA: Diagnosis not present

## 2013-09-06 DIAGNOSIS — M25549 Pain in joints of unspecified hand: Secondary | ICD-10-CM | POA: Diagnosis not present

## 2013-12-18 ENCOUNTER — Encounter: Payer: Self-pay | Admitting: Internal Medicine

## 2013-12-18 ENCOUNTER — Other Ambulatory Visit (INDEPENDENT_AMBULATORY_CARE_PROVIDER_SITE_OTHER): Payer: Medicare Other

## 2013-12-18 ENCOUNTER — Ambulatory Visit (INDEPENDENT_AMBULATORY_CARE_PROVIDER_SITE_OTHER): Payer: Medicare Other | Admitting: Internal Medicine

## 2013-12-18 VITALS — BP 120/70 | HR 62 | Temp 98.1°F | Wt 149.2 lb

## 2013-12-18 DIAGNOSIS — E785 Hyperlipidemia, unspecified: Secondary | ICD-10-CM

## 2013-12-18 DIAGNOSIS — N400 Enlarged prostate without lower urinary tract symptoms: Secondary | ICD-10-CM | POA: Diagnosis not present

## 2013-12-18 DIAGNOSIS — Z23 Encounter for immunization: Secondary | ICD-10-CM

## 2013-12-18 DIAGNOSIS — I10 Essential (primary) hypertension: Secondary | ICD-10-CM

## 2013-12-18 LAB — BASIC METABOLIC PANEL
BUN: 17 mg/dL (ref 6–23)
CALCIUM: 9.6 mg/dL (ref 8.4–10.5)
CO2: 28 meq/L (ref 19–32)
Chloride: 102 mEq/L (ref 96–112)
Creatinine, Ser: 0.8 mg/dL (ref 0.4–1.5)
GFR: 92.41 mL/min (ref >60.00)
Glucose, Bld: 92 mg/dL (ref 70–99)
Potassium: 4.7 mEq/L (ref 3.5–5.1)
SODIUM: 139 meq/L (ref 135–145)

## 2013-12-18 LAB — LIPID PANEL
Cholesterol: 151 mg/dL (ref 0–200)
HDL: 34.7 mg/dL — ABNORMAL LOW
LDL Cholesterol: 96 mg/dL (ref 0–99)
NonHDL: 116.3
Total CHOL/HDL Ratio: 4
Triglycerides: 102 mg/dL (ref 0.0–149.0)
VLDL: 20.4 mg/dL (ref 0.0–40.0)

## 2013-12-18 LAB — CBC WITH DIFFERENTIAL/PLATELET
Basophils Absolute: 0 K/uL (ref 0.0–0.1)
Basophils Relative: 0.3 % (ref 0.0–3.0)
Eosinophils Absolute: 0.3 K/uL (ref 0.0–0.7)
Eosinophils Relative: 5.8 % — ABNORMAL HIGH (ref 0.0–5.0)
HCT: 43.2 % (ref 39.0–52.0)
Hemoglobin: 14.7 g/dL (ref 13.0–17.0)
Lymphocytes Relative: 14.4 % (ref 12.0–46.0)
Lymphs Abs: 0.8 K/uL (ref 0.7–4.0)
MCHC: 34 g/dL (ref 30.0–36.0)
MCV: 97.6 fl (ref 78.0–100.0)
Monocytes Absolute: 0.3 K/uL (ref 0.1–1.0)
Monocytes Relative: 5.9 % (ref 3.0–12.0)
Neutro Abs: 3.9 K/uL (ref 1.4–7.7)
Neutrophils Relative %: 73.6 % (ref 43.0–77.0)
Platelets: 274 K/uL (ref 150.0–400.0)
RBC: 4.43 Mil/uL (ref 4.22–5.81)
RDW: 13.5 % (ref 11.5–15.5)
WBC: 5.3 K/uL (ref 4.0–10.5)

## 2013-12-18 LAB — HEPATIC FUNCTION PANEL
ALT: 15 U/L (ref 0–53)
AST: 25 U/L (ref 0–37)
Albumin: 4.1 g/dL (ref 3.5–5.2)
Alkaline Phosphatase: 76 U/L (ref 39–117)
BILIRUBIN DIRECT: 0.2 mg/dL (ref 0.0–0.3)
BILIRUBIN TOTAL: 1.1 mg/dL (ref 0.2–1.2)
Total Protein: 7.3 g/dL (ref 6.0–8.3)

## 2013-12-18 LAB — TSH: TSH: 2.19 u[IU]/mL (ref 0.35–4.50)

## 2013-12-18 LAB — URINALYSIS, ROUTINE W REFLEX MICROSCOPIC
Bilirubin Urine: NEGATIVE
Hgb urine dipstick: NEGATIVE
Leukocytes, UA: NEGATIVE
Nitrite: NEGATIVE
Specific Gravity, Urine: 1.01
Total Protein, Urine: NEGATIVE
Urine Glucose: NEGATIVE
Urobilinogen, UA: 0.2
pH: 7.5 (ref 5.0–8.0)

## 2013-12-18 MED ORDER — AMLODIPINE BESYLATE 5 MG PO TABS: ORAL_TABLET | ORAL | Status: DC

## 2013-12-18 NOTE — Assessment & Plan Note (Addendum)
stable overall by history and exam, recent data reviewed with pt, and pt to continue medical treatment as before,  to f/u any worsening symptoms or concerns, most recent psa's ok, delcines further due to age Lab Results  Component Value Date   PSA 3.85 12/15/2011   PSA 5.19* 12/03/2009

## 2013-12-18 NOTE — Progress Notes (Signed)
Pre visit review using our clinic review tool, if applicable. No additional management support is needed unless otherwise documented below in the visit note. 

## 2013-12-18 NOTE — Assessment & Plan Note (Signed)
stable overall by history and exam, recent data reviewed with pt, and pt to continue medical treatment as before,  to f/u any worsening symptoms or concerns Lab Results  Component Value Date   LDLCALC 80 12/15/2012

## 2013-12-18 NOTE — Patient Instructions (Signed)
You had the new Prevnar pneuomonia shot today  Please continue all other medications as before, and refills have been done if requested.  Please have the pharmacy call with any other refills you may need.  Please continue your efforts at being more active, low cholesterol diet, and weight control.  You are otherwise up to date with prevention measures today.  Please keep your appointments with your specialists as you may have planned  Please go to the LAB in the Basement (turn left off the elevator) for the tests to be done today  You will be contacted by phone if any changes need to be made immediately.  Otherwise, you will receive a letter about your results with an explanation, but please check with MyChart first.  Please remember to sign up for MyChart if you have not done so, as this will be important to you in the future with finding out test results, communicating by private email, and scheduling acute appointments online when needed.  Please return in 1 year for your yearly visit, or sooner if needed

## 2013-12-18 NOTE — Addendum Note (Signed)
Addended by: Sharon Seller B on: 12/18/2013 10:40 AM   Modules accepted: Orders

## 2013-12-18 NOTE — Progress Notes (Signed)
Subjective:    Patient ID: Jason Lane, male    DOB: 09-24-1927, 80 y.o.   MRN: [id number]  HPI  Here for yearly f/u;  Overall doing ok;  Pt denies CP, worsening SOB, DOE, wheezing, orthopnea, PND, worsening LE edema, palpitations, dizziness or syncope.  Pt denies neurological change such as new headache, facial or extremity weakness.  Pt denies polydipsia, polyuria, or low sugar symptoms. Pt states overall good compliance with treatment and medications, good tolerability, and has been trying to follow lower cholesterol diet.  Pt denies worsening depressive symptoms, suicidal ideation or panic. No fever, night sweats, wt loss, loss of appetite, or other constitutional symptoms.  Pt states good ability with ADL's, has low fall risk, home safety reviewed and adequate, no other significant changes in hearing or vision, and only occasionally active with exercise.  No current complaints.  Did have a fx left patella 6 mo ago, now healed, Denies urinary symptoms such as dysuria, frequency, urgency, flank pain, hematuria or n/v, fever, chills. Past Medical History  Diagnosis Date  . HYPERLIPIDEMIA 12/03/2009  . HYPERTENSION 02/01/2010  . ASTHMA 12/03/2009  . GERD 12/03/2009  . DIVERTICULOSIS, COLON 12/03/2009  . BENIGN PROSTATIC HYPERTROPHY 12/03/2009  . SKIN LESION 12/03/2009  . FATIGUE 12/03/2009  . TREMOR 12/03/2009  . HYPERTENSION NEC 12/03/2009  . MALIGNANT MELANOMA, HX OF 12/03/2009  . TOBACCO USE, QUIT 12/03/2009  . ANEMIA-IRON DEFICIENCY 07/08/2010   Past Surgical History  Procedure Laterality Date  . S/p left leg melanoma    . Inguinal herniorrhapy  1970    right    reports that he has quit smoking. He has never used smokeless tobacco. He reports that he drinks about 4.2 ounces of alcohol per week. He reports that he does not use illicit drugs. family history includes Colon cancer in his father. No Known Allergies Current Outpatient Prescriptions on File Prior to Visit  Medication Sig Dispense Refill  .  B Complex Vitamins (B COMPLEX 100 PO) Take by mouth daily.        . cholecalciferol (VITAMIN D-400) 400 UNITS TABS Take by mouth daily.        . Cinnamon 500 MG capsule Take 500 mg by mouth daily.        . Multiple Vitamin (MULTIVITAMIN) capsule Take 1 capsule by mouth daily.        [name] NETTLE-PYGEUM AFRICANUM PO Take 1 capsule by mouth daily.      . saw palmetto 500 MG capsule Take 500 mg by mouth daily.       No current facility-administered medications on file prior to visit.    Review of Systems Constitutional: Negative for increased diaphoresis, other activity, appetite or other siginficant weight change  HENT: Negative for worsening hearing loss, ear pain, facial swelling, mouth sores and neck stiffness.   Eyes: Negative for other worsening pain, redness or visual disturbance.  Respiratory: Negative for shortness of breath and wheezing.   Cardiovascular: Negative for chest pain and palpitations.  Gastrointestinal: Negative for diarrhea, blood in stool, abdominal distention or other pain Genitourinary: Negative for hematuria, flank pain or change in urine volume.  Musculoskeletal: Negative for myalgias or other joint complaints.  Skin: Negative for color change and wound.  Neurological: Negative for syncope and numbness. other than noted Hematological: Negative for adenopathy. or other swelling Psychiatric/Behavioral: Negative for hallucinations, self-injury, decreased concentration or other worsening agitation.      Objective:   Physical Exam BP 120/70  Pulse 62  Temp(Src) 98.1  F (36.7 C) (Oral)  Wt 149 lb 4 oz (67.699 kg)  SpO2 95% VS noted,  Constitutional: Pt is oriented to person, place, and time. Appears well-developed and well-nourished.  Head: Normocephalic and atraumatic.  Right Ear: External ear normal.  Left Ear: External ear normal.  Nose: Nose normal.  Mouth/Throat: Oropharynx is clear and moist.  Eyes: Conjunctivae and EOM are normal. Pupils are equal, round,  and reactive to light.  Neck: Normal range of motion. Neck supple. No JVD present. No tracheal deviation present.  Cardiovascular: Normal rate, regular rhythm, normal heart sounds and intact distal pulses.   Pulmonary/Chest: Effort normal and breath sounds without rales or wheezing  Abdominal: Soft. Bowel sounds are normal. NT. No HSM  Musculoskeletal: Normal range of motion. Exhibits no edema.  Lymphadenopathy:  Has no cervical adenopathy.  Neurological: Pt is alert and oriented to person, place, and time. Pt has normal reflexes. No cranial nerve deficit. Motor grossly intact Skin: Skin is warm and dry. No rash noted.  Psychiatric:  Has normal mood and affect. Behavior is normal.     Assessment & Plan:

## 2013-12-18 NOTE — Assessment & Plan Note (Signed)
stable overall by history and exam, recent data reviewed with pt, and pt to continue medical treatment as before,  to f/u any worsening symptoms or concerns BP Readings from Last 3 Encounters:  12/18/13 120/70  12/15/12 132/68  06/23/12 132/70

## 2014-07-19 ENCOUNTER — Telehealth: Payer: Self-pay

## 2014-07-19 NOTE — Telephone Encounter (Signed)
No way to leave message about flu vaccine

## 2014-10-20 IMAGING — CR DG CHEST 2V
2 series · 2 of 2 positions shown · non-contrast
Comparison: None.

CLINICAL DATA: Chest pain

CHEST - 2 VIEW

[view not recorded (1 of 2)]
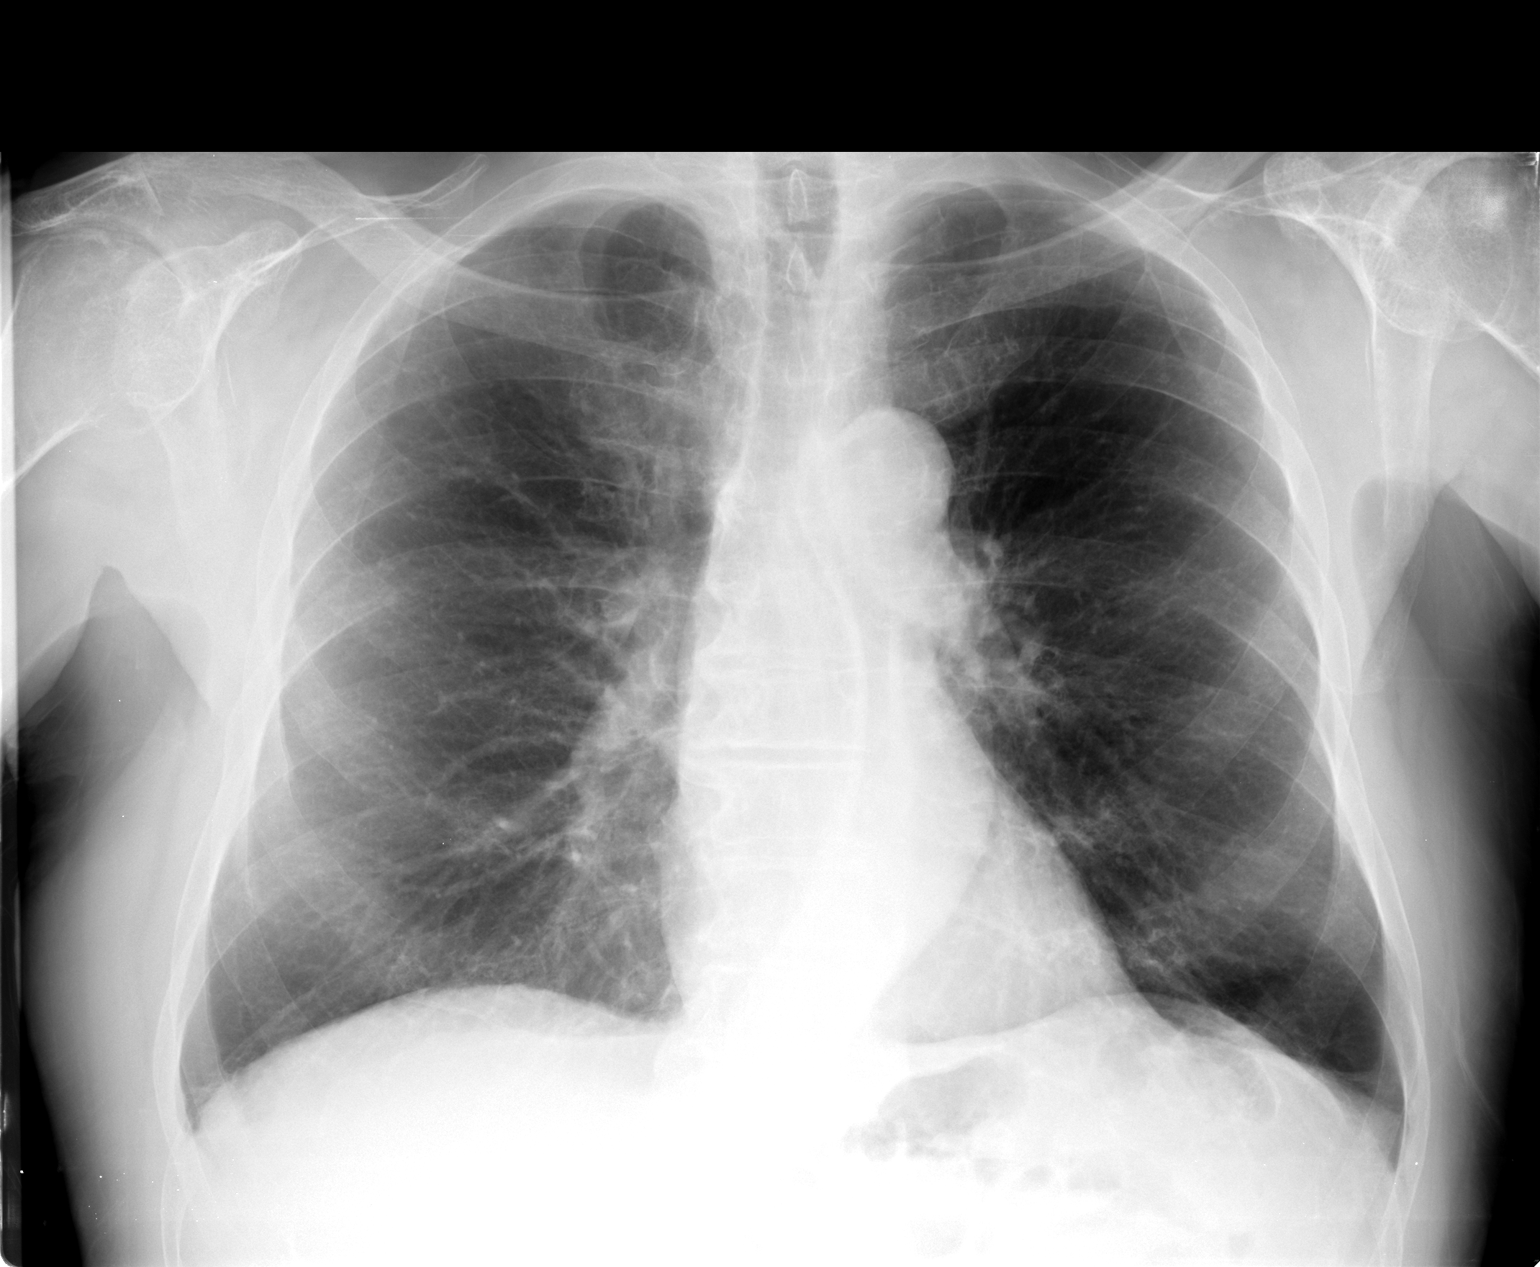

[view not recorded (2 of 2)]
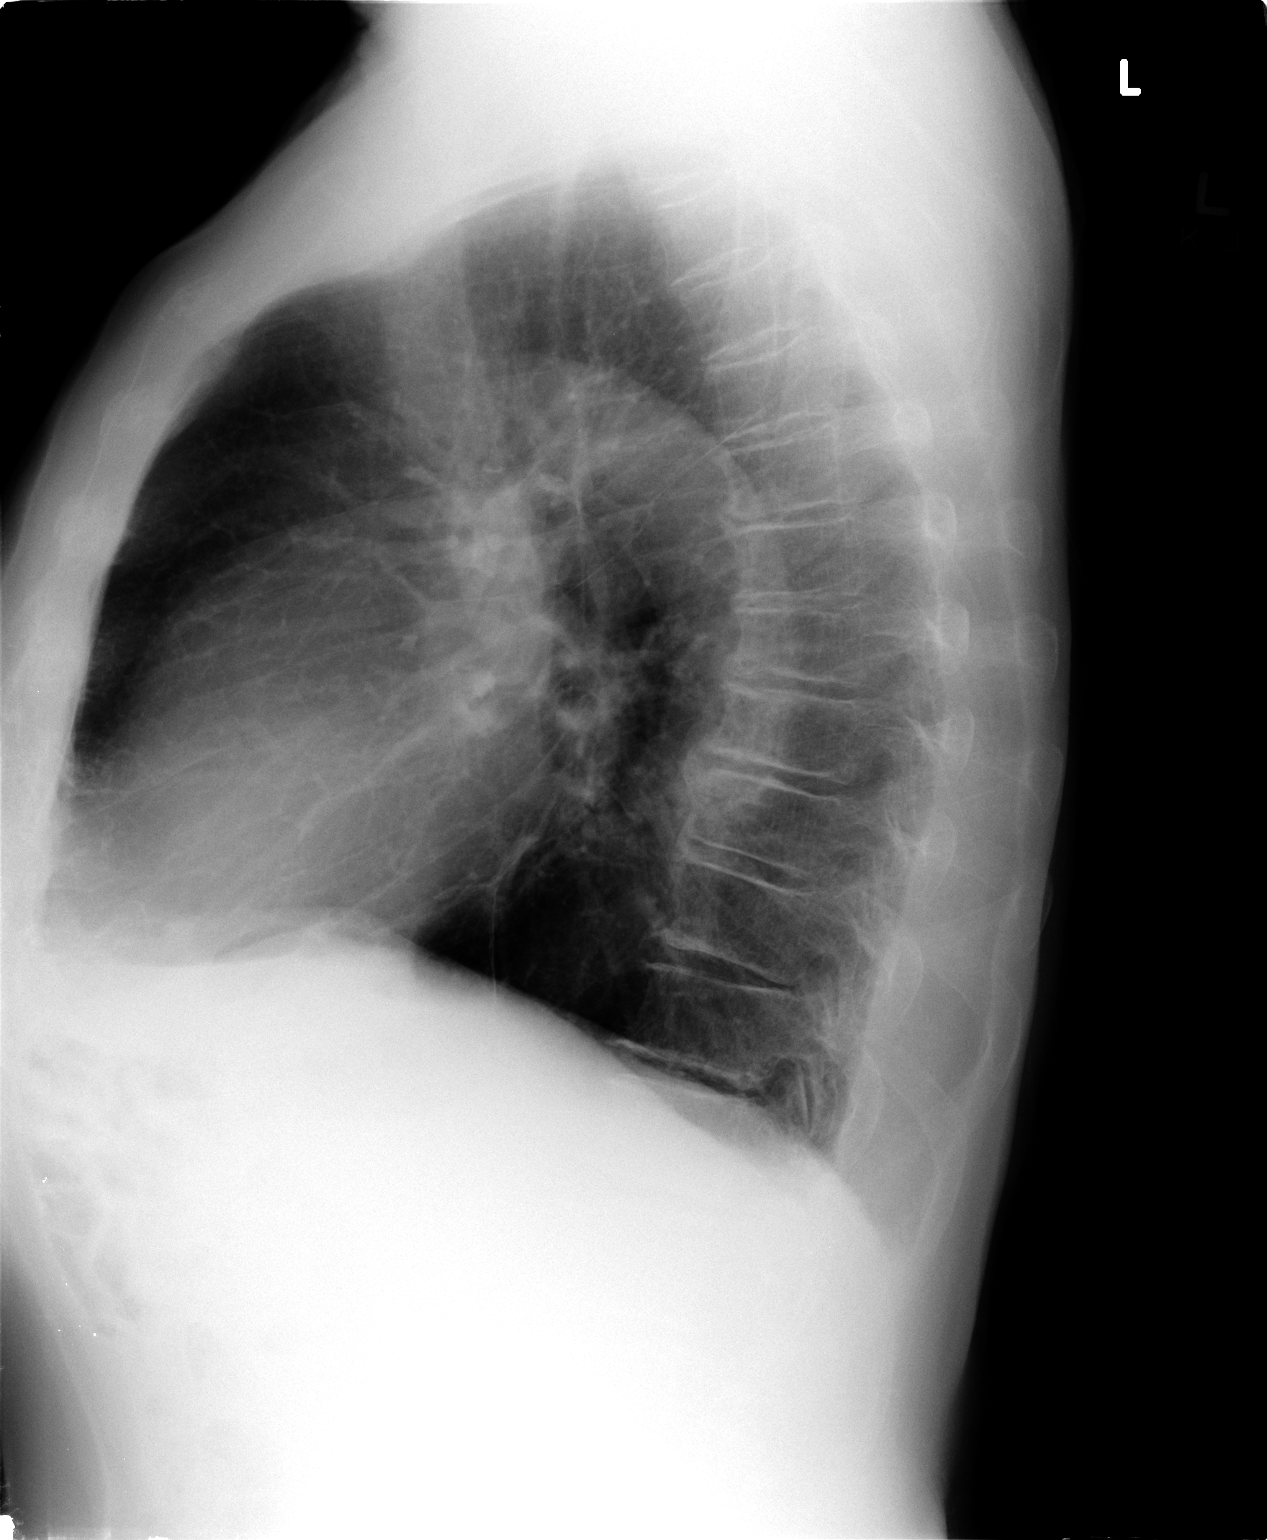

[2 of 2 positions shown; findings below may reference images not displayed]

FINDINGS: COPD with hyperinflation of the lungs.  Negative for
pneumonia.  Negative for heart failure or mass lesion.  Lungs are
clear.
IMPRESSION: COPD without acute abnormality.

## 2014-12-18 ENCOUNTER — Telehealth: Payer: Self-pay

## 2014-12-18 NOTE — Telephone Encounter (Signed)
2nd outreach for AWV but does not have a VM at this time.

## 2014-12-18 NOTE — Telephone Encounter (Signed)
Call to fup on AWV; The patient phone did not have a voice mail on it and could not leave a message; Contacted to see if he can come in for AWV;

## 2014-12-24 ENCOUNTER — Other Ambulatory Visit (INDEPENDENT_AMBULATORY_CARE_PROVIDER_SITE_OTHER): Payer: Medicare Other

## 2014-12-24 ENCOUNTER — Ambulatory Visit (INDEPENDENT_AMBULATORY_CARE_PROVIDER_SITE_OTHER): Payer: Medicare Other | Admitting: Internal Medicine

## 2014-12-24 ENCOUNTER — Encounter: Payer: Self-pay | Admitting: Internal Medicine

## 2014-12-24 VITALS — BP 124/78 | HR 52 | Temp 97.7°F | Ht 68.0 in | Wt 142.0 lb

## 2014-12-24 DIAGNOSIS — Z0000 Encounter for general adult medical examination without abnormal findings: Secondary | ICD-10-CM | POA: Diagnosis not present

## 2014-12-24 DIAGNOSIS — E538 Deficiency of other specified B group vitamins: Secondary | ICD-10-CM

## 2014-12-24 DIAGNOSIS — E785 Hyperlipidemia, unspecified: Secondary | ICD-10-CM

## 2014-12-24 DIAGNOSIS — I10 Essential (primary) hypertension: Secondary | ICD-10-CM | POA: Diagnosis not present

## 2014-12-24 DIAGNOSIS — G629 Polyneuropathy, unspecified: Secondary | ICD-10-CM | POA: Diagnosis not present

## 2014-12-24 DIAGNOSIS — R001 Bradycardia, unspecified: Secondary | ICD-10-CM

## 2014-12-24 DIAGNOSIS — N401 Enlarged prostate with lower urinary tract symptoms: Secondary | ICD-10-CM

## 2014-12-24 DIAGNOSIS — R351 Nocturia: Secondary | ICD-10-CM

## 2014-12-24 LAB — HEPATIC FUNCTION PANEL
ALBUMIN: 4.1 g/dL (ref 3.5–5.2)
ALT: 15 U/L (ref 0–53)
AST: 21 U/L (ref 0–37)
Alkaline Phosphatase: 81 U/L (ref 39–117)
Bilirubin, Direct: 0.3 mg/dL (ref 0.0–0.3)
TOTAL PROTEIN: 6.8 g/dL (ref 6.0–8.3)
Total Bilirubin: 0.8 mg/dL (ref 0.2–1.2)

## 2014-12-24 LAB — LIPID PANEL
CHOLESTEROL: 126 mg/dL (ref 0–200)
HDL: 31.3 mg/dL — AB (ref >39.00)
LDL Cholesterol: 76 mg/dL (ref 0–99)
NONHDL: 95.14
Total CHOL/HDL Ratio: 4
Triglycerides: 98 mg/dL (ref 0.0–149.0)
VLDL: 19.6 mg/dL (ref 0.0–40.0)

## 2014-12-24 LAB — URINALYSIS, ROUTINE W REFLEX MICROSCOPIC
BILIRUBIN URINE: NEGATIVE
HGB URINE DIPSTICK: NEGATIVE
KETONES UR: 40 — AB
LEUKOCYTES UA: NEGATIVE
Nitrite: NEGATIVE
PH: 6 (ref 5.0–8.0)
RBC / HPF: NONE SEEN (ref 0–?)
Specific Gravity, Urine: 1.025 (ref 1.000–1.030)
TOTAL PROTEIN, URINE-UPE24: NEGATIVE
UROBILINOGEN UA: 0.2 (ref 0.0–1.0)
Urine Glucose: NEGATIVE
WBC UA: NONE SEEN (ref 0–?)

## 2014-12-24 LAB — CBC WITH DIFFERENTIAL/PLATELET
Basophils Absolute: 0 10*3/uL (ref 0.0–0.1)
Basophils Relative: 0.4 % (ref 0.0–3.0)
Eosinophils Absolute: 0.4 10*3/uL (ref 0.0–0.7)
Eosinophils Relative: 5.7 % — ABNORMAL HIGH (ref 0.0–5.0)
HCT: 44.6 % (ref 39.0–52.0)
Hemoglobin: 14.9 g/dL (ref 13.0–17.0)
Lymphocytes Relative: 10.4 % — ABNORMAL LOW (ref 12.0–46.0)
Lymphs Abs: 0.8 10*3/uL (ref 0.7–4.0)
MCHC: 33.5 g/dL (ref 30.0–36.0)
MCV: 96.2 fl (ref 78.0–100.0)
Monocytes Absolute: 0.3 10*3/uL (ref 0.1–1.0)
Monocytes Relative: 3.7 % (ref 3.0–12.0)
Neutro Abs: 5.8 10*3/uL (ref 1.4–7.7)
Neutrophils Relative %: 79.8 % — ABNORMAL HIGH (ref 43.0–77.0)
Platelets: 316 10*3/uL (ref 150.0–400.0)
RBC: 4.64 Mil/uL (ref 4.22–5.81)
RDW: 14.8 % (ref 11.5–15.5)
WBC: 7.3 10*3/uL (ref 4.0–10.5)

## 2014-12-24 LAB — BASIC METABOLIC PANEL
BUN: 26 mg/dL — ABNORMAL HIGH (ref 6–23)
CALCIUM: 9.7 mg/dL (ref 8.4–10.5)
CO2: 29 meq/L (ref 19–32)
CREATININE: 0.73 mg/dL (ref 0.40–1.50)
Chloride: 102 mEq/L (ref 96–112)
GFR: 108.4 mL/min (ref >60.00)
GLUCOSE: 88 mg/dL (ref 70–99)
Potassium: 4.5 mEq/L (ref 3.5–5.1)
Sodium: 139 mEq/L (ref 135–145)

## 2014-12-24 LAB — TSH: TSH: 2.47 u[IU]/mL (ref 0.35–4.50)

## 2014-12-24 LAB — VITAMIN B12: Vitamin B-12: 1222 pg/mL — ABNORMAL HIGH (ref 211–911)

## 2014-12-24 MED ORDER — AMLODIPINE BESYLATE 5 MG PO TABS
ORAL_TABLET | ORAL | Status: DC
Start: 2014-12-24 — End: 2015-11-28

## 2014-12-24 NOTE — Patient Instructions (Signed)

## 2014-12-24 NOTE — Assessment & Plan Note (Signed)
stable overall by history and exam, recent data reviewed with pt, and pt to continue medical treatment as before,  to f/u any worsening symptoms or concerns BP Readings from Last 3 Encounters:  12/24/14 124/78  12/18/13 120/70  12/15/12 132/68

## 2014-12-24 NOTE — Assessment & Plan Note (Signed)
stable overall by history and exam, recent data reviewed with pt, and pt to continue medical treatment as before,  to f/u any worsening symptoms or concerns Lab Results  Component Value Date   LDLCALC 96 12/18/2013  for f/u labs

## 2014-12-24 NOTE — Progress Notes (Signed)
Subjective:    Patient ID: Jason Lane, male    DOB: [birth year], 81 y.o.   MRN: [id number]  HPI  Here for yearly f/u;  Overall doing ok;  Pt denies Chest pain, worsening SOB, DOE, wheezing, orthopnea, PND, worsening LE edema, palpitations, dizziness or syncope.  Pt denies neurological change such as new headache, facial or extremity weakness.  Pt denies polydipsia, polyuria, or low sugar symptoms. Pt states overall good compliance with treatment and medications, good tolerability, and has been trying to follow appropriate diet.  Pt denies worsening depressive symptoms, suicidal ideation or panic. No fever, night sweats, wt loss, loss of appetite, or other constitutional symptoms.  Pt states good ability with ADL's, has low fall risk, home safety reviewed and adequate, no other significant changes in hearing or vision, and active with exercise with gym a few times per wk, just not every day as he was doing a few yrs ago. Has nocturia 4 times/night, declines further eval  Bialt Toe numbness worse in the past 1-2 yrs, no painful, taking oral B12 Past Medical History  Diagnosis Date  . HYPERLIPIDEMIA 12/03/2009  . HYPERTENSION 02/01/2010  . ASTHMA 12/03/2009  . GERD 12/03/2009  . DIVERTICULOSIS, COLON 12/03/2009  . BENIGN PROSTATIC HYPERTROPHY 12/03/2009  . SKIN LESION 12/03/2009  . FATIGUE 12/03/2009  . TREMOR 12/03/2009  . HYPERTENSION NEC 12/03/2009  . MALIGNANT MELANOMA, HX OF 12/03/2009  . TOBACCO USE, QUIT 12/03/2009  . ANEMIA-IRON DEFICIENCY 07/08/2010   Past Surgical History  Procedure Laterality Date  . S/p left leg melanoma    . Inguinal herniorrhapy  1970    right    reports that he has quit smoking. He has never used smokeless tobacco. He reports that he drinks about 4.2 oz of alcohol per week. He reports that he does not use illicit drugs. family history includes Colon cancer in his father. No Known Allergies Current Outpatient Prescriptions on File Prior to Visit  Medication Sig Dispense Refill    . amLODipine (NORVASC) 5 MG tablet TAKE 1/2 TABLET DAILY 45 tablet 3  . B Complex Vitamins (B COMPLEX 100 PO) Take by mouth daily.      . cholecalciferol (VITAMIN D-400) 400 UNITS TABS Take by mouth daily.      . Cinnamon 500 MG capsule Take 500 mg by mouth daily.      . Multiple Vitamin (MULTIVITAMIN) capsule Take 1 capsule by mouth daily.      [name] NETTLE-PYGEUM AFRICANUM PO Take 1 capsule by mouth daily.    . saw palmetto 500 MG capsule Take 500 mg by mouth daily.     No current facility-administered medications on file prior to visit.    Review of Systems Constitutional: Negative for increased diaphoresis, other activity, appetite or siginficant weight change other than noted HENT: Negative for worsening hearing loss, ear pain, facial swelling, mouth sores and neck stiffness.   Eyes: Negative for other worsening pain, redness or visual disturbance.  Respiratory: Negative for shortness of breath and wheezing  Cardiovascular: Negative for chest pain and palpitations.  Gastrointestinal: Negative for diarrhea, blood in stool, abdominal distention or other pain Genitourinary: Negative for hematuria, flank pain or change in urine volume.  Musculoskeletal: Negative for myalgias or other joint complaints.  Skin: Negative for color change and wound or drainage.  Neurological: Negative for syncope and numbness. other than noted Hematological: Negative for adenopathy. or other swelling Psychiatric/Behavioral: Negative for hallucinations, SI, self-injury, decreased concentration or other worsening agitation.  Objective:   Physical Exam BP 124/78 mmHg  Pulse 52  Temp(Src) 97.7 F (36.5 C) (Oral)  Ht 5\' 8"  (1.727 m)  Wt 142 lb (64.411 kg)  BMI 21.60 kg/m2  SpO2 97% VS noted,  Constitutional: Pt is oriented to person, place, and time. Appears well-developed and well-nourished, in no significant distress Head: Normocephalic and atraumatic.  Right Ear: External ear normal.  Left Ear:  External ear normal.  Nose: Nose normal.  Mouth/Throat: Oropharynx is clear and moist.  Eyes: Conjunctivae and EOM are normal. Pupils are equal, round, and reactive to light.  Neck: Normal range of motion. Neck supple. No JVD present. No tracheal deviation present or significant neck LA or mass Cardiovascular: Normal rate, regular rhythm, normal heart sounds and intact distal pulses.   Pulmonary/Chest: Effort normal and breath sounds without rales or wheezing  Abdominal: Soft. Bowel sounds are normal. NT. No HSM  Musculoskeletal: Normal range of motion. Exhibits no edema.  Lymphadenopathy:  Has no cervical adenopathy.  Neurological: Pt is alert and oriented to person, place, and time. Pt has normal reflexes. No cranial nerve deficit. Motor grossly intact, decrsaed sens to LT to toes bilat Skin: Skin is warm and dry. No rash noted.  Psychiatric:  Has mild nervous mood and affect. Behavior is normal.     Assessment & Plan:

## 2014-12-24 NOTE — Addendum Note (Signed)
Addended by: Lyman Bishop on: 12/24/2014 10:34 AM   Modules accepted: Orders

## 2014-12-24 NOTE — Assessment & Plan Note (Signed)
Mild subjective worsening, nonpainful, ambulates ok, for b12 check today

## 2014-12-24 NOTE — Progress Notes (Signed)
Pre visit review using our clinic review tool, if applicable. No additional management support is needed unless otherwise documented below in the visit note. 

## 2014-12-24 NOTE — Assessment & Plan Note (Signed)
Asympt,  to f/u any worsening symptoms or concernsk, ECG reviewed as per emr

## 2014-12-24 NOTE — Assessment & Plan Note (Signed)
Declines further tx or eval at this time

## 2014-12-25 ENCOUNTER — Encounter: Payer: Self-pay | Admitting: Internal Medicine

## 2015-05-27 ENCOUNTER — Ambulatory Visit (INDEPENDENT_AMBULATORY_CARE_PROVIDER_SITE_OTHER): Payer: Medicare Other | Admitting: Internal Medicine

## 2015-05-27 ENCOUNTER — Encounter: Payer: Self-pay | Admitting: Internal Medicine

## 2015-05-27 VITALS — BP 128/74 | HR 55 | Temp 98.3°F | Resp 20 | Ht 68.0 in | Wt 145.2 lb

## 2015-05-27 DIAGNOSIS — K4090 Unilateral inguinal hernia, without obstruction or gangrene, not specified as recurrent: Secondary | ICD-10-CM | POA: Diagnosis not present

## 2015-05-27 DIAGNOSIS — L989 Disorder of the skin and subcutaneous tissue, unspecified: Secondary | ICD-10-CM | POA: Insufficient documentation

## 2015-05-27 DIAGNOSIS — I10 Essential (primary) hypertension: Secondary | ICD-10-CM | POA: Diagnosis not present

## 2015-05-27 NOTE — Patient Instructions (Signed)
You will be contacted regarding the referral for: General Surgury for the possible hernia, as well as Dermatology   Please continue all other medications as before, and refills have been done if requested.  Please have the pharmacy call with any other refills you may need.  Please continue your efforts at being more active, low cholesterol diet, and weight control.  You are otherwise up to date with prevention measures today.  Please keep your appointments with your specialists as you may have planned

## 2015-05-27 NOTE — Progress Notes (Signed)
Pre visit review using our clinic review tool, if applicable. No additional management support is needed unless otherwise documented below in the visit note. 

## 2015-05-27 NOTE — Progress Notes (Signed)
Subjective:    Patient ID: Jason Lane, male    DOB: April 08, 1928, 82 y.o.   MRN: [id number]  HPI  Here to f/u with acute complaint of pain and swelling to left ingujinal area for 2 mo, recurrs now daily assoc with some pain, but Denies worsening reflux, abd pain, dysphagia, n/v, bowel change or blood.   Pt denies fever, wt loss, night sweats, loss of appetite, or other constitutional symptoms  /Pt denies chest pain, increased sob or doe, wheezing, orthopnea, PND, increased LE swelling, palpitations, dizziness or syncope. Pt denies new neurological symptoms such as new headache, or facial or extremity weakness or numbness   Pt denies polydipsia, polyuria  Has hx of LIH 40 yrs ago similar repair about 40 yrs ago.  Also incidentally with skin lesion to right head just above the forehear, enlarging he believes in last few months as well.  Past Medical History  Diagnosis Date  . HYPERLIPIDEMIA 12/03/2009  . HYPERTENSION 02/01/2010  . ASTHMA 12/03/2009  . GERD 12/03/2009  . DIVERTICULOSIS, COLON 12/03/2009  . BENIGN PROSTATIC HYPERTROPHY 12/03/2009  . SKIN LESION 12/03/2009  . FATIGUE 12/03/2009  . TREMOR 12/03/2009  . HYPERTENSION NEC 12/03/2009  . MALIGNANT MELANOMA, HX OF 12/03/2009  . TOBACCO USE, QUIT 12/03/2009  . ANEMIA-IRON DEFICIENCY 07/08/2010   Past Surgical History  Procedure Laterality Date  . S/p left leg melanoma    . Inguinal herniorrhapy  1970    right    reports that he has quit smoking. He has never used smokeless tobacco. He reports that he drinks about 4.2 oz of alcohol per week. He reports that he does not use illicit drugs. family history includes Colon cancer in his father. No Known Allergies Current Outpatient Prescriptions on File Prior to Visit  Medication Sig Dispense Refill  . amLODipine (NORVASC) 5 MG tablet TAKE 1/2 TABLET DAILY 45 tablet 3  . B Complex Vitamins (B COMPLEX 100 PO) Take by mouth daily.      . cholecalciferol (VITAMIN D-400) 400 UNITS TABS Take by mouth daily.        . Cinnamon 500 MG capsule Take 500 mg by mouth daily.      . Multiple Vitamin (MULTIVITAMIN) capsule Take 1 capsule by mouth daily.      [name] NETTLE-PYGEUM AFRICANUM PO Take 1 capsule by mouth daily.    . saw palmetto 500 MG capsule Take 500 mg by mouth daily.     No current facility-administered medications on file prior to visit.    Review of Systems  Constitutional: Negative for unusual diaphoresis or night sweats HENT: Negative for ringing in ear or discharge Eyes: Negative for double vision or worsening visual disturbance.  Respiratory: Negative for choking and stridor.   Gastrointestinal: Negative for vomiting or other signifcant bowel change Genitourinary: Negative for hematuria or change in urine volume.  Musculoskeletal: Negative for other MSK pain or swelling Skin: Negative for color change and worsening wound.  Neurological: Negative for tremors and numbness other than noted  Psychiatric/Behavioral: Negative for decreased concentration or agitation other than above       Objective:   Physical Exam BP 128/74 mmHg  Pulse 55  Temp(Src) 98.3 F (36.8 C) (Oral)  Resp 20  Ht 5\' 8"  (1.727 m)  Wt 145 lb 4 oz (65.885 kg)  BMI 22.09 kg/m2  SpO2 97% VS noted, not ill appearing Constitutional: Pt appears in no significant distress HENT: Head: NCAT.  Right Ear: External ear normal.  Left Ear: External  ear normal.  Eyes: . Pupils are equal, round, and reactive to light. Conjunctivae and EOM are normal Neck: Normal range of motion. Neck supple.  Cardiovascular: Normal rate and regular rhythm.   Pulmonary/Chest: Effort normal and breath sounds without rales or wheezing.  Abd:  Soft, NT, ND, + BS Left inguinal area with mod sized LIH, reducible,  Mild tender Neurological: Pt is alert. Not confused , motor grossly intact Skin: Skin is warm. No rash, no LE edema, 1/2 cm dark slight raised lesion right frontal scalp Psychiatric: Pt behavior is normal. No agitation.     Assessment  & Plan:

## 2015-05-31 NOTE — Assessment & Plan Note (Signed)
stable overall by history and exam, recent data reviewed with pt, and pt to continue medical treatment as before,  to f/u any worsening symptoms or concerns BP Readings from Last 3 Encounters:  05/27/15 128/74  12/24/14 124/78  12/18/13 120/70

## 2015-05-31 NOTE — Assessment & Plan Note (Addendum)
Mild to mod pain, non incarcerated, for gen surgury referral, avoid heavy lifting or bending

## 2015-05-31 NOTE — Assessment & Plan Note (Signed)
Also for derm referral, r/o malignancy

## 2015-06-10 DIAGNOSIS — K4091 Unilateral inguinal hernia, without obstruction or gangrene, recurrent: Secondary | ICD-10-CM | POA: Diagnosis not present

## 2015-06-16 ENCOUNTER — Ambulatory Visit: Payer: Self-pay | Admitting: General Surgery

## 2015-06-16 NOTE — H&P (Signed)
Jason Lane 06/10/2015 2:09 PM Location: [facility] Patient #: [id number] DOB: 06-Apr-1928 Single / Language: [name] / Race: White Male   History of Present Illness [name] M. Bastien Strawser MD; 06/10/2015 5:11 PM) The patient is a 82 year old male who presents with an inguinal hernia. He is referred by Dr. Cathlean Cower for evaluation of a recurrent left inguinal hernia. He states that he had a hernia repaired many years ago. He states that mesh was not used. He states that he was clearing some brush and lifting up some heavy logs and immediately felt some groin pain on the left side. This is about 2 months ago. He limited his physical activity since then and as result of the pain has significantly improved and really doesn't bother him that much. There'll be an occasional ache. There is no sharp, shooting, stabbing pain. He does notice a bulge. It comes and goes. He denies any nausea, vomiting, diarrhea or constipation. He does have an enlarged prostate and has nocturia, sensation of not completely emptying his bladder, and trouble starting his stream. He lives by himself. He denies any chest pain, chest pressure, shortness of breath, distal exertion, orthopnea, paroxysmal nocturnal dyspnea, edema, TIAs or amaurosis fugax. He does not smoke. He does not take any blood thinners.   Problem List/Past Medical [name] [name], MD; 06/10/2015 5:14 PM) UNILATERAL RECURRENT INGUINAL HERNIA WITHOUT OBSTRUCTION OR GANGRENE (K40.91)  Other Problems [name], MD; 06/10/2015 5:14 PM) Enlarged Prostate High blood pressure Inguinal Hernia Melanoma  Past Surgical History [name], [name]; 06/10/2015 2:09 PM) Cataract Surgery Left. Colon Polyp Removal - Colonoscopy Colon Polyp Removal - Open Open Inguinal Hernia Surgery Left. Sentinel Lymph Node Biopsy  Diagnostic Studies History [name], [name]; 06/10/2015 2:09 PM) Colonoscopy 1-5 years ago  Allergies [name], [name];  06/10/2015 2:09 PM) No Known Drug Allergies02/11/2015  Medication History [name], MD; 06/10/2015 5:14 PM) AmLODIPine Besylate (5MG  Tablet, Oral) Active. Medications Reconciled Flomax (0.4MG  Capsule, 1 (one) Capsule Oral daily, Taken starting 06/10/2015) Active. (start taking 2 weeks before hernia surgery)  Social History [name], [name]; 06/10/2015 2:09 PM) Alcohol use Occasional alcohol use. Caffeine use Coffee. No drug use Tobacco use Former smoker.  Family History [name], [name]; 06/10/2015 2:09 PM) Colon Cancer Father.    Review of Systems [name] [name]; 06/10/2015 2:09 PM) General Not Present- Appetite Loss, Chills, Fatigue, Fever, Night Sweats, Weight Gain and Weight Loss. Skin Present- Change in Wart/Mole. Not Present- Dryness, Hives, Jaundice, New Lesions, Non-Healing Wounds, Rash and Ulcer. HEENT Not Present- Earache, Hearing Loss, Hoarseness, Nose Bleed, Oral Ulcers, Ringing in the Ears, Seasonal Allergies, Sinus Pain, Sore Throat, Visual Disturbances, Wears glasses/contact lenses and Yellow Eyes. Respiratory Present- Snoring. Not Present- Bloody sputum, Chronic Cough, Difficulty Breathing and Wheezing. Breast Not Present- Breast Mass, Breast Pain, Nipple Discharge and Skin Changes. Cardiovascular Not Present- Chest Pain, Difficulty Breathing Lying Down, Leg Cramps, Palpitations, Rapid Heart Rate, Shortness of Breath and Swelling of Extremities. Gastrointestinal Not Present- Abdominal Pain, Bloating, Bloody Stool, Change in Bowel Habits, Chronic diarrhea, Constipation, Difficulty Swallowing, Excessive gas, Gets full quickly at meals, Hemorrhoids, Indigestion, Nausea, Rectal Pain and Vomiting. Male Genitourinary Present- Change in Urinary Stream, Frequency and Urgency. Not Present- Blood in Urine, Impotence, Nocturia, Painful Urination and Urine Leakage. Musculoskeletal Present- Muscle Weakness. Not Present- Back Pain, Joint Pain, Joint Stiffness, Muscle Pain and  Swelling of Extremities. Neurological Present- Decreased Memory. Not Present- Fainting, Headaches, Numbness, Seizures, Tingling, Tremor, Trouble walking and Weakness.  Psychiatric Not Present- Anxiety, Bipolar, Change in Sleep Pattern, Depression, Fearful and Frequent crying. Endocrine Not Present- Cold Intolerance, Excessive Hunger, Hair Changes, Heat Intolerance, Hot flashes and New Diabetes. Hematology Not Present- Easy Bruising, Excessive bleeding, Gland problems, HIV and Persistent Infections.  Vitals [name] [name]; 06/10/2015 2:10 PM) 06/10/2015 2:10 PM Weight: 144 lb Height: 66in Body Surface Area: 1.74 m Body Mass Index: 23.24 kg/m  Temp.: 97.73F(Temporal)  Pulse: 56 (Regular)  BP: 122/64 (Sitting, Left Arm, Standard)       Physical Exam [name] M. Axten Pascucci MD; 06/10/2015 5:12 PM) General Mental Status-Alert. General Appearance-Consistent with stated age. Hydration-Well hydrated. Voice-Normal.  Head and Neck Head-normocephalic, atraumatic with no lesions or palpable masses. Trachea-midline. Thyroid Gland Characteristics - normal size and consistency.  Eye Eyeball - Bilateral-Extraocular movements intact. Sclera/Conjunctiva - Bilateral-No scleral icterus.  Chest and Lung Exam Chest and lung exam reveals -quiet, even and easy respiratory effort with no use of accessory muscles and on auscultation, normal breath sounds, no adventitious sounds and normal vocal resonance. Inspection Chest Wall - Normal. Back - normal.  Breast - Did not examine.  Cardiovascular Cardiovascular examination reveals -normal heart sounds, regular rate and rhythm with no murmurs and normal pedal pulses bilaterally.  Abdomen Inspection Inspection of the abdomen reveals - No Hernias. Skin - Scar - no surgical scars. Palpation/Percussion Palpation and Percussion of the abdomen reveal - Soft, Non Tender, No Rebound tenderness, No Rigidity (guarding) and No  hepatosplenomegaly. Auscultation Auscultation of the abdomen reveals - Bowel sounds normal.  Male Genitourinary Note: Mildly atrophic testicles bilaterally. Old left inguinal scar. Patient examined supine and standing. Obvious small left groin bulge, easily reducible. No evidence of right inguinal hernia. No enlarged ring.   Peripheral Vascular Upper Extremity Palpation - Pulses bilaterally normal.  Neurologic Neurologic evaluation reveals -alert and oriented x 3 with no impairment of recent or remote memory. Mental Status-Normal.  Neuropsychiatric The patient's mood and affect are described as -normal. Judgment and Insight-insight is appropriate concerning matters relevant to self.  Musculoskeletal Normal Exam - Left-Upper Extremity Strength Normal and Lower Extremity Strength Normal. Normal Exam - Right-Upper Extremity Strength Normal and Lower Extremity Strength Normal.  Lymphatic Head & Neck  General Head & Neck Lymphatics: Bilateral - Description - Normal. Axillary - Did not examine. Femoral & Inguinal - Did not examine.    Assessment & Plan [name] M. Leanord Thibeau MD; 06/10/2015 5:14 PM) UNILATERAL RECURRENT INGUINAL HERNIA WITHOUT OBSTRUCTION OR GANGRENE (K40.91) Impression: We did discuss nonsurgical versus surgical management. We discussed using a hernia belt. With respect to surgery I recommended a laparoscopic approach since this is a recurrent inguinal hernia.  We discussed the etiology of inguinal hernias. We discussed the signs & symptoms of incarceration & strangulation.  The patient has elected to proceed with laparoscopic repair of left recurrent inguinal hernia with mesh  I described the procedure in detail. The patient was given educational material. We discussed the risks and benefits including but not limited to bleeding, infection, chronic inguinal pain, nerve entrapment, hernia recurrence, mesh complications, hematoma formation, urinary retention,  injury to the testicle, numbness in the groin, blood clots, injury to the surrounding structures, and anesthesia risk. We also discussed the typical post operative recovery course, including no heavy lifting for 4-6 weeks. I explained that the likelihood of improvement of their symptoms is good.  Because of his BPH issues this does make him at higher risk for postoperative urinary retention. I will place him on perioperative Flomax to try to  decrease his risk Current Plans You are being scheduled for surgery - Our schedulers will call you.  You should hear from our office's scheduling department within 5 working days about the location, date, and time of surgery. We try to make accommodations for patient's preferences in scheduling surgery, but sometimes the OR schedule or the surgeon's schedule prevents [place] from making those accommodations.  If you have not heard from our office [phone]) in 5 working days, call the office and ask for your surgeon's nurse.  If you have other questions about your diagnosis, plan, or surgery, call the office and ask for your surgeon's nurse.  Pickup prescription for Flomax and start taking at least 2 weeks before surgery including the morning of  Pt Education - Pamphlet Given - Laparoscopic Hernia Repair: discussed with patient and provided information. Started Flomax 0.4MG , 1 (one) Capsule daily, #30, 06/10/2015, No Refill. Local Order: start taking 2 weeks before hernia surgery  Leighton Ruff. Redmond Pulling, MD, FACS General, Bariatric, & Minimally Invasive Surgery [facility], [place]

## 2015-06-18 ENCOUNTER — Encounter (HOSPITAL_COMMUNITY)
Admission: RE | Admit: 2015-06-18 | Discharge: 2015-06-18 | Disposition: A | Discharge status: Home / Self Care | Payer: Medicare Other | Source: Ambulatory Visit | Attending: General Surgery | Admitting: General Surgery

## 2015-06-18 ENCOUNTER — Encounter (HOSPITAL_COMMUNITY): Payer: Self-pay

## 2015-06-18 DIAGNOSIS — Z01812 Encounter for preprocedural laboratory examination: Secondary | ICD-10-CM | POA: Insufficient documentation

## 2015-06-18 DIAGNOSIS — Z79899 Other long term (current) drug therapy: Secondary | ICD-10-CM | POA: Insufficient documentation

## 2015-06-18 DIAGNOSIS — Z87891 Personal history of nicotine dependence: Secondary | ICD-10-CM | POA: Diagnosis not present

## 2015-06-18 DIAGNOSIS — K4090 Unilateral inguinal hernia, without obstruction or gangrene, not specified as recurrent: Secondary | ICD-10-CM | POA: Insufficient documentation

## 2015-06-18 DIAGNOSIS — I10 Essential (primary) hypertension: Secondary | ICD-10-CM | POA: Insufficient documentation

## 2015-06-18 DIAGNOSIS — Z85820 Personal history of malignant melanoma of skin: Secondary | ICD-10-CM | POA: Diagnosis not present

## 2015-06-18 DIAGNOSIS — E785 Hyperlipidemia, unspecified: Secondary | ICD-10-CM | POA: Insufficient documentation

## 2015-06-18 DIAGNOSIS — Z01818 Encounter for other preprocedural examination: Secondary | ICD-10-CM | POA: Diagnosis not present

## 2015-06-18 HISTORY — DX: Malignant (primary) neoplasm, unspecified: C80.1

## 2015-06-18 LAB — COMPREHENSIVE METABOLIC PANEL
ALBUMIN: 4 g/dL (ref 3.5–5.0)
ALK PHOS: 99 U/L (ref 38–126)
ALT: 25 U/L (ref 17–63)
AST: 30 U/L (ref 15–41)
Anion gap: 10 (ref 5–15)
BUN: 15 mg/dL (ref 6–20)
CALCIUM: 9.9 mg/dL (ref 8.9–10.3)
CO2: 28 mmol/L (ref 22–32)
CREATININE: 0.8 mg/dL (ref 0.61–1.24)
Chloride: 104 mmol/L (ref 101–111)
GFR calc Af Amer: >60 mL/min (ref >60)
GFR calc non Af Amer: >60 mL/min (ref >60)
GLUCOSE: 112 mg/dL — AB (ref 65–99)
Potassium: 4.5 mmol/L (ref 3.5–5.1)
SODIUM: 142 mmol/L (ref 135–145)
Total Bilirubin: 0.8 mg/dL (ref 0.3–1.2)
Total Protein: 7.2 g/dL (ref 6.5–8.1)

## 2015-06-18 LAB — CBC
HCT: 47.8 % (ref 39.0–52.0)
HEMOGLOBIN: 15.9 g/dL (ref 13.0–17.0)
MCH: 32 pg (ref 26.0–34.0)
MCHC: 33.3 g/dL (ref 30.0–36.0)
MCV: 96.2 fL (ref 78.0–100.0)
Platelets: 386 10*3/uL (ref 150–400)
RBC: 4.97 MIL/uL (ref 4.22–5.81)
RDW: 14.7 % (ref 11.5–15.5)
WBC: 10.6 10*3/uL — ABNORMAL HIGH (ref 4.0–10.5)

## 2015-06-18 NOTE — Progress Notes (Signed)
PCP is Dr Cathlean Cower Denies ever seeing a cardiologist. Denies ever having a stress test, echo, or card cath.

## 2015-06-18 NOTE — Pre-Procedure Instructions (Signed)
Jason Lane  06/18/2015      [facility] - [place], [place] [address] Phone: [phone] Fax: [phone], [place], [place] - [address] [address] [place] [address] Phone: [phone] Fax: [phone]    Your procedure is scheduled on Feb. 21  Report to [place] [facility] at [phone].  Call this number if you have problems the morning of surgery:  [phone]   Remember:  Do not eat food or drink liquids after midnight.  Take these medicines the morning of surgery with A SIP OF WATER amlodipine (norvasc) Stop taking Aspirin, Ibuprofen, Advil, Motrin, BC's, Goody's, Herbal medications, Fish oil   Do not wear jewelry, make-up or nail polish.  Do not wear lotions, powders, or perfumes.  You may wear deodorant.  Do not shave 48 hours prior to surgery.  Men may shave face and neck.  Do not bring valuables to the hospital.  [facility] is not responsible for any belongings or valuables.  Contacts, dentures or bridgework may not be worn into surgery.  Leave your suitcase in the car.  After surgery it may be brought to your room.  For patients admitted to the hospital, discharge time will be determined by your treatment team.  Patients discharged the day of surgery will not be allowed to drive home.  Special instructions:  [facility] - Preparing for Surgery  Before surgery, you can play an important role.  Because skin is not sterile, your skin needs to be as free of germs as possible.  You can reduce the number of germs on you skin by washing with CHG (chlorahexidine gluconate) soap before surgery.  CHG is an antiseptic cleaner which kills germs and bonds with the skin to continue killing germs even after washing.  Please DO NOT use if you have an allergy to CHG or antibacterial soaps.  If your skin becomes reddened/irritated stop using the  CHG and inform your nurse when you arrive at Short Stay.  Do not shave (including legs and underarms) for at least 48 hours prior to the first CHG shower.  You may shave your face.  Please follow these instructions carefully:   1.  Shower with CHG Soap the night before surgery and the   morning of Surgery.  2.  If you choose to wash your hair, wash your hair first as usual with your   normal shampoo.  3.  After you shampoo, rinse your hair and body thoroughly to remove the   Shampoo.  4.  Use CHG as you would any other liquid soap.  You can apply chg directly  to the skin and wash gently with scrungie or a clean washcloth.  5.  Apply the CHG Soap to your body ONLY FROM THE NECK DOWN.   Do not use on open wounds or open sores.  Avoid contact with your eyes,  ears, mouth and genitals (private parts).  Wash genitals (private parts) with your normal soap.  6.  Wash thoroughly, paying special attention to the area where your surgery will be performed.  7.  Thoroughly rinse your body with warm water from the neck down.  8.  DO NOT shower/wash with your normal soap after using and rinsing off  the CHG Soap.  9.  Pat yourself dry with a clean towel.  10.  Wear clean pajamas.            11.  Place clean sheets on your bed the night of your first shower and do not sleep with pets.  Day of Surgery  Do not apply any lotions/deoderants the morning of surgery.  Please wear clean clothes to the hospital/surgery center.     Please read over the following fact sheets that you were given. Pain Booklet, Coughing and Deep Breathing and Surgical Site Infection Prevention

## 2015-06-19 NOTE — Progress Notes (Signed)
Anesthesia Chart Review:  Pt is an 82 year old male scheduled for laparoscopic L inguinal hernia repair on 06/24/2015 with Dr. Redmond Pulling.   PCP is Dr. Cathlean Cower who referred pt for surgery.   PMH includes:  HTN, hyperlipidemia, anemia, malignant melanoma. Former smoker. BMI 22.   Medications include: amlodipine  Preoperative labs reviewed.    EKG 12/24/14: Marked sinus  Bradycardia (49 bpm). Poor R-wave progression -nonspecific -consider old anterior infarct. Appears stable when compared to tracing dated 06/23/12.   If no changes, I anticipate pt can proceed with surgery as scheduled.   Willeen Cass, FNP-BC [facility]/Anesthesiology Phone: [phone] 06/19/2015 1:28 PM

## 2015-06-23 MED ORDER — CEFAZOLIN SODIUM-DEXTROSE 2-3 GM-% IV SOLR
2.0000 g | INTRAVENOUS | Status: AC
  Administered 2015-06-24: 2 g via INTRAVENOUS
  Filled 2015-06-23: qty 50

## 2015-06-23 MED ORDER — CHLORHEXIDINE GLUCONATE 4 % EX LIQD: 1.0000 "application " | Freq: Once | CUTANEOUS | Status: DC

## 2015-06-23 NOTE — Anesthesia Preprocedure Evaluation (Signed)
Anesthesia Evaluation  Patient identified by MRN, date of birth, ID band Patient awake    Reviewed: Allergy & Precautions, H&P , Patient's Chart, lab work & pertinent test results, reviewed documented beta blocker date and time   Airway Mallampati: II  TM Distance: >3 FB Neck ROM: full    Dental no notable dental hx.    Pulmonary asthma , former smoker,    Pulmonary exam normal breath sounds clear to auscultation       Cardiovascular hypertension, On Medications  Rhythm:regular Rate:Normal     Neuro/Psych    GI/Hepatic   Endo/Other    Renal/GU      Musculoskeletal   Abdominal   Peds  Hematology   Anesthesia Other Findings   Reproductive/Obstetrics                             Anesthesia Physical Anesthesia Plan  ASA: II  Anesthesia Plan: General   Post-op Pain Management:    Induction: Intravenous  Airway Management Planned: Oral ETT  Additional Equipment:   Intra-op Plan:   Post-operative Plan: Extubation in OR  Informed Consent: I have reviewed the patients History and Physical, chart, labs and discussed the procedure including the risks, benefits and alternatives for the proposed anesthesia with the patient or authorized representative who has indicated his/her understanding and acceptance.   Dental Advisory Given and Dental advisory given  Plan Discussed with: CRNA and Surgeon  Anesthesia Plan Comments: (  Discussed general anesthesia, including possible nausea, instrumentation of airway, sore throat,pulmonary aspiration, etc. I asked if the were any outstanding questions, or  concerns before we proceeded. )        Anesthesia Quick Evaluation

## 2015-06-24 ENCOUNTER — Ambulatory Visit (HOSPITAL_COMMUNITY): Payer: Medicare Other | Admitting: Emergency Medicine

## 2015-06-24 ENCOUNTER — Encounter (HOSPITAL_COMMUNITY): Payer: Self-pay | Admitting: Anesthesiology

## 2015-06-24 ENCOUNTER — Ambulatory Visit (HOSPITAL_COMMUNITY): Payer: Medicare Other | Admitting: Anesthesiology

## 2015-06-24 ENCOUNTER — Observation Stay (HOSPITAL_COMMUNITY)
Admission: RE | Admit: 2015-06-24 | Discharge: 2015-06-25 | Disposition: A | Discharge status: Home / Self Care | Payer: Medicare Other | Source: Ambulatory Visit | Attending: General Surgery | Admitting: General Surgery

## 2015-06-24 ENCOUNTER — Encounter (HOSPITAL_COMMUNITY)
Admission: RE | Disposition: A | Discharge status: Home / Self Care | Payer: Self-pay | Source: Ambulatory Visit | Attending: General Surgery

## 2015-06-24 DIAGNOSIS — Z9889 Other specified postprocedural states: Secondary | ICD-10-CM

## 2015-06-24 DIAGNOSIS — J45909 Unspecified asthma, uncomplicated: Secondary | ICD-10-CM | POA: Insufficient documentation

## 2015-06-24 DIAGNOSIS — N401 Enlarged prostate with lower urinary tract symptoms: Secondary | ICD-10-CM | POA: Insufficient documentation

## 2015-06-24 DIAGNOSIS — I10 Essential (primary) hypertension: Secondary | ICD-10-CM | POA: Diagnosis not present

## 2015-06-24 DIAGNOSIS — Z87891 Personal history of nicotine dependence: Secondary | ICD-10-CM | POA: Diagnosis not present

## 2015-06-24 DIAGNOSIS — K4091 Unilateral inguinal hernia, without obstruction or gangrene, recurrent: Principal | ICD-10-CM | POA: Insufficient documentation

## 2015-06-24 DIAGNOSIS — R3914 Feeling of incomplete bladder emptying: Secondary | ICD-10-CM | POA: Diagnosis not present

## 2015-06-24 DIAGNOSIS — R351 Nocturia: Secondary | ICD-10-CM | POA: Diagnosis not present

## 2015-06-24 DIAGNOSIS — Z8719 Personal history of other diseases of the digestive system: Secondary | ICD-10-CM

## 2015-06-24 HISTORY — PX: INSERTION OF MESH: SHX5868

## 2015-06-24 HISTORY — PX: INGUINAL HERNIA REPAIR: SHX194

## 2015-06-24 SURGERY — REPAIR, HERNIA, INGUINAL, LAPAROSCOPIC
Anesthesia: General | Site: Groin | Laterality: Left

## 2015-06-24 MED ORDER — SUCCINYLCHOLINE CHLORIDE 20 MG/ML IJ SOLN
INTRAMUSCULAR | Status: AC
  Filled 2015-06-24: qty 1

## 2015-06-24 MED ORDER — NEOSTIGMINE METHYLSULFATE 10 MG/10ML IV SOLN
INTRAVENOUS | Status: DC | PRN
  Administered 2015-06-24: 3 mg via INTRAVENOUS

## 2015-06-24 MED ORDER — DOCUSATE SODIUM 100 MG PO CAPS
100.0000 mg | ORAL_CAPSULE | Freq: Two times a day (BID) | ORAL | Status: DC
  Administered 2015-06-24 – 2015-06-25 (×2): 100 mg via ORAL
  Filled 2015-06-24 (×3): qty 1

## 2015-06-24 MED ORDER — LACTATED RINGERS IV SOLN
INTRAVENOUS | Status: DC | PRN
  Administered 2015-06-24: 07:00:00 via INTRAVENOUS

## 2015-06-24 MED ORDER — GLYCOPYRROLATE 0.2 MG/ML IJ SOLN
INTRAMUSCULAR | Status: AC
  Filled 2015-06-24: qty 1

## 2015-06-24 MED ORDER — ONDANSETRON HCL 4 MG/2ML IJ SOLN
INTRAMUSCULAR | Status: DC | PRN
  Administered 2015-06-24: 4 mg via INTRAVENOUS

## 2015-06-24 MED ORDER — BUPIVACAINE LIPOSOME 1.3 % IJ SUSP
20.0000 mL | INTRAMUSCULAR | Status: AC
  Administered 2015-06-24: 20 mL
  Filled 2015-06-24: qty 20

## 2015-06-24 MED ORDER — ENOXAPARIN SODIUM 30 MG/0.3ML ~~LOC~~ SOLN
30.0000 mg | SUBCUTANEOUS | Status: DC
  Administered 2015-06-25: 30 mg via SUBCUTANEOUS
  Filled 2015-06-24: qty 0.3

## 2015-06-24 MED ORDER — AMLODIPINE BESYLATE 2.5 MG PO TABS
2.5000 mg | ORAL_TABLET | Freq: Every day | ORAL | Status: DC
  Administered 2015-06-25: 2.5 mg via ORAL
  Filled 2015-06-24: qty 1

## 2015-06-24 MED ORDER — SIMETHICONE 80 MG PO CHEW: 40.0000 mg | CHEWABLE_TABLET | Freq: Four times a day (QID) | ORAL | Status: DC | PRN

## 2015-06-24 MED ORDER — TAMSULOSIN HCL 0.4 MG PO CAPS
0.4000 mg | ORAL_CAPSULE | Freq: Every day | ORAL | Status: DC
  Administered 2015-06-24 – 2015-06-25 (×2): 0.4 mg via ORAL
  Filled 2015-06-24 (×2): qty 1

## 2015-06-24 MED ORDER — ACETAMINOPHEN 325 MG PO TABS
650.0000 mg | ORAL_TABLET | Freq: Four times a day (QID) | ORAL | Status: DC
  Filled 2015-06-24 (×2): qty 2

## 2015-06-24 MED ORDER — POTASSIUM CHLORIDE IN NACL 20-0.9 MEQ/L-% IV SOLN
INTRAVENOUS | Status: DC
  Administered 2015-06-24: 15:00:00 via INTRAVENOUS
  Filled 2015-06-24: qty 1000

## 2015-06-24 MED ORDER — DIPHENHYDRAMINE HCL 12.5 MG/5ML PO ELIX: 12.5000 mg | ORAL_SOLUTION | Freq: Four times a day (QID) | ORAL | Status: DC | PRN

## 2015-06-24 MED ORDER — BUPIVACAINE-EPINEPHRINE (PF) 0.25% -1:200000 IJ SOLN
INTRAMUSCULAR | Status: DC | PRN
  Administered 2015-06-24: 8 mL

## 2015-06-24 MED ORDER — FENTANYL CITRATE (PF) 100 MCG/2ML IJ SOLN: 25.0000 ug | INTRAMUSCULAR | Status: DC | PRN

## 2015-06-24 MED ORDER — PROMETHAZINE HCL 25 MG/ML IJ SOLN: 12.5000 mg | Freq: Four times a day (QID) | INTRAMUSCULAR | Status: DC | PRN

## 2015-06-24 MED ORDER — BUPIVACAINE-EPINEPHRINE (PF) 0.25% -1:200000 IJ SOLN
INTRAMUSCULAR | Status: AC
  Filled 2015-06-24: qty 30

## 2015-06-24 MED ORDER — ROCURONIUM BROMIDE 100 MG/10ML IV SOLN
INTRAVENOUS | Status: DC | PRN
Start: 2015-06-24 — End: 2015-06-24
  Administered 2015-06-24: 40 mg via INTRAVENOUS
  Administered 2015-06-24: 10 mg via INTRAVENOUS

## 2015-06-24 MED ORDER — OXYCODONE HCL 5 MG PO TABS: 5.0000 mg | ORAL_TABLET | ORAL | Status: DC | PRN

## 2015-06-24 MED ORDER — MORPHINE SULFATE (PF) 2 MG/ML IV SOLN: 1.0000 mg | INTRAVENOUS | Status: DC | PRN

## 2015-06-24 MED ORDER — LIDOCAINE HCL (CARDIAC) 20 MG/ML IV SOLN
INTRAVENOUS | Status: DC | PRN
  Administered 2015-06-24: 100 mg via INTRAVENOUS

## 2015-06-24 MED ORDER — FENTANYL CITRATE (PF) 250 MCG/5ML IJ SOLN
INTRAMUSCULAR | Status: AC
  Filled 2015-06-24: qty 5

## 2015-06-24 MED ORDER — PROPOFOL 10 MG/ML IV BOLUS
INTRAVENOUS | Status: DC | PRN
  Administered 2015-06-24: 100 mg via INTRAVENOUS

## 2015-06-24 MED ORDER — ONDANSETRON HCL 4 MG/2ML IJ SOLN: 4.0000 mg | Freq: Four times a day (QID) | INTRAMUSCULAR | Status: DC | PRN

## 2015-06-24 MED ORDER — GLYCOPYRROLATE 0.2 MG/ML IJ SOLN
INTRAMUSCULAR | Status: DC | PRN
  Administered 2015-06-24: 0.4 mg via INTRAVENOUS

## 2015-06-24 MED ORDER — PHENYLEPHRINE HCL 10 MG/ML IJ SOLN
INTRAMUSCULAR | Status: AC
  Filled 2015-06-24: qty 1

## 2015-06-24 MED ORDER — LIDOCAINE HCL (CARDIAC) 20 MG/ML IV SOLN
INTRAVENOUS | Status: AC
  Filled 2015-06-24: qty 5

## 2015-06-24 MED ORDER — 0.9 % SODIUM CHLORIDE (POUR BTL) OPTIME
TOPICAL | Status: DC | PRN
  Administered 2015-06-24: 1000 mL

## 2015-06-24 MED ORDER — PANTOPRAZOLE SODIUM 40 MG IV SOLR
40.0000 mg | Freq: Every day | INTRAVENOUS | Status: DC
  Administered 2015-06-24: 40 mg via INTRAVENOUS
  Filled 2015-06-24: qty 40

## 2015-06-24 MED ORDER — DIPHENHYDRAMINE HCL 50 MG/ML IJ SOLN: 12.5000 mg | Freq: Four times a day (QID) | INTRAMUSCULAR | Status: DC | PRN

## 2015-06-24 MED ORDER — MEPERIDINE HCL 25 MG/ML IJ SOLN: 6.2500 mg | INTRAMUSCULAR | Status: DC | PRN

## 2015-06-24 MED ORDER — PHENYLEPHRINE HCL 10 MG/ML IJ SOLN
INTRAMUSCULAR | Status: DC | PRN
  Administered 2015-06-24 (×4): 80 ug via INTRAVENOUS

## 2015-06-24 MED ORDER — NEOSTIGMINE METHYLSULFATE 10 MG/10ML IV SOLN
INTRAVENOUS | Status: AC
  Filled 2015-06-24: qty 1

## 2015-06-24 MED ORDER — ONDANSETRON HCL 4 MG/2ML IJ SOLN: 4.0000 mg | Freq: Once | INTRAMUSCULAR | Status: DC | PRN

## 2015-06-24 MED ORDER — PROPOFOL 10 MG/ML IV BOLUS
INTRAVENOUS | Status: AC
  Filled 2015-06-24: qty 20

## 2015-06-24 MED ORDER — ROCURONIUM BROMIDE 50 MG/5ML IV SOLN
INTRAVENOUS | Status: AC
  Filled 2015-06-24: qty 1

## 2015-06-24 MED ORDER — ACETAMINOPHEN 160 MG/5ML PO SOLN
650.0000 mg | Freq: Once | ORAL | Status: DC
  Filled 2015-06-24: qty 20.3

## 2015-06-24 MED ORDER — ONDANSETRON 4 MG PO TBDP: 4.0000 mg | ORAL_TABLET | Freq: Four times a day (QID) | ORAL | Status: DC | PRN

## 2015-06-24 MED ORDER — EPHEDRINE SULFATE 50 MG/ML IJ SOLN
INTRAMUSCULAR | Status: DC | PRN
Start: 2015-06-24 — End: 2015-06-24
  Administered 2015-06-24: 10 mg via INTRAVENOUS
  Administered 2015-06-24: 5 mg via INTRAVENOUS
  Administered 2015-06-24: 10 mg via INTRAVENOUS
  Administered 2015-06-24: 5 mg via INTRAVENOUS

## 2015-06-24 MED ORDER — FENTANYL CITRATE (PF) 100 MCG/2ML IJ SOLN
INTRAMUSCULAR | Status: DC | PRN
  Administered 2015-06-24: 100 ug via INTRAVENOUS
  Administered 2015-06-24 (×2): 50 ug via INTRAVENOUS
  Administered 2015-06-24: 100 ug via INTRAVENOUS

## 2015-06-24 SURGICAL SUPPLY — 43 items
APPLIER CLIP 5 13 M/L LIGAMAX5 (MISCELLANEOUS)
APPLIER CLIP ROT 10 11.4 M/L (STAPLE)
BENZOIN TINCTURE PRP APPL 2/3 (GAUZE/BANDAGES/DRESSINGS) IMPLANT
BLADE SURG ROTATE 9660 (MISCELLANEOUS) IMPLANT
CANISTER SUCTION 2500CC (MISCELLANEOUS) IMPLANT
CHLORAPREP W/TINT 26ML (MISCELLANEOUS) ×3 IMPLANT
CLIP APPLIE 5 13 M/L LIGAMAX5 (MISCELLANEOUS) IMPLANT
CLIP APPLIE ROT 10 11.4 M/L (STAPLE) IMPLANT
COVER SURGICAL LIGHT HANDLE (MISCELLANEOUS) ×3 IMPLANT
DERMABOND ADVANCED (GAUZE/BANDAGES/DRESSINGS) ×2
DERMABOND ADVANCED .7 DNX12 (GAUZE/BANDAGES/DRESSINGS) ×1 IMPLANT
DEVICE SECURE STRAP 25 ABSORB (INSTRUMENTS) ×3 IMPLANT
DRSG TEGADERM 4X4.75 (GAUZE/BANDAGES/DRESSINGS) IMPLANT
ELECT REM PT RETURN 9FT ADLT (ELECTROSURGICAL) ×3
ELECTRODE REM PT RTRN 9FT ADLT (ELECTROSURGICAL) ×1 IMPLANT
GAUZE SPONGE 2X2 8PLY STRL LF (GAUZE/BANDAGES/DRESSINGS) IMPLANT
GLOVE BIOGEL M STRL SZ7.5 (GLOVE) ×3 IMPLANT
GLOVE BIOGEL PI IND STRL 7.0 (GLOVE) ×1 IMPLANT
GLOVE BIOGEL PI IND STRL 8 (GLOVE) ×1 IMPLANT
GLOVE BIOGEL PI INDICATOR 7.0 (GLOVE) ×2
GLOVE BIOGEL PI INDICATOR 8 (GLOVE) ×2
GLOVE SURG SS PI 6.5 STRL IVOR (GLOVE) ×3 IMPLANT
GOWN STRL REUS W/ TWL LRG LVL3 (GOWN DISPOSABLE) ×2 IMPLANT
GOWN STRL REUS W/ TWL XL LVL3 (GOWN DISPOSABLE) ×1 IMPLANT
GOWN STRL REUS W/TWL LRG LVL3 (GOWN DISPOSABLE) ×4
GOWN STRL REUS W/TWL XL LVL3 (GOWN DISPOSABLE) ×2
KIT BASIN OR (CUSTOM PROCEDURE TRAY) ×3 IMPLANT
KIT ROOM TURNOVER OR (KITS) ×3 IMPLANT
LIQUID BAND (GAUZE/BANDAGES/DRESSINGS) IMPLANT
MESH 3DMAX 4X6 LT LRG (Mesh General) ×3 IMPLANT
NEEDLE HYPO 25GX1X1/2 BEV (NEEDLE) ×3 IMPLANT
NS IRRIG 1000ML POUR BTL (IV SOLUTION) ×3 IMPLANT
PAD ARMBOARD 7.5X6 YLW CONV (MISCELLANEOUS) ×6 IMPLANT
SCISSORS LAP 5X35 DISP (ENDOMECHANICALS) ×3 IMPLANT
SET IRRIG TUBING LAPAROSCOPIC (IRRIGATION / IRRIGATOR) IMPLANT
SPONGE GAUZE 2X2 STER 10/PKG (GAUZE/BANDAGES/DRESSINGS)
SUT MNCRL AB 4-0 PS2 18 (SUTURE) ×3 IMPLANT
TOWEL OR 17X24 6PK STRL BLUE (TOWEL DISPOSABLE) IMPLANT
TOWEL OR 17X26 10 PK STRL BLUE (TOWEL DISPOSABLE) ×3 IMPLANT
TRAY LAPAROSCOPIC MC (CUSTOM PROCEDURE TRAY) ×3 IMPLANT
TROCAR XCEL BLADELESS 5X75MML (TROCAR) ×6 IMPLANT
TROCAR XCEL BLUNT TIP 100MML (ENDOMECHANICALS) ×3 IMPLANT
TUBING INSUFFLATION (TUBING) ×3 IMPLANT

## 2015-06-24 NOTE — Progress Notes (Signed)
Pt having trouble voiding, flomax and cranberry juice administered. Pt also encouraged to drink plenty of water. Will monitor situation

## 2015-06-24 NOTE — Interval H&P Note (Signed)
History and Physical Interval Note:  06/24/2015 7:13 AM  Jason Lane  has presented today for surgery, with the diagnosis of recurrent left inguinal hernia  The various methods of treatment have been discussed with the patient and family. After consideration of risks, benefits and other options for treatment, the patient has consented to  Procedure(s): LAPAROSCOPIC REPAIR RECURRENT LEFT INGUINAL HERNIA (Left) INSERTION OF MESH (Left) as a surgical intervention .  The patient's history has been reviewed, patient examined, no change in status, stable for surgery.  I have reviewed the patient's chart and labs.  Questions were answered to the patient's satisfaction.    Leighton Ruff. Redmond Pulling, MD, [place], Bariatric, & Minimally Invasive Surgery [facility], [place]  [facility] M

## 2015-06-24 NOTE — Progress Notes (Signed)
Pt was able to void in dribbles but no complete voiding reported. In and out catheter utilized as ordered with 700cc removed. Pt reports relief

## 2015-06-24 NOTE — Transfer of Care (Signed)
Immediate Anesthesia Transfer of Care Note  Patient: Jason Lane  Procedure(s) Performed: Procedure(s): LAPAROSCOPIC REPAIR RECURRENT LEFT INGUINAL HERNIA (Left) INSERTION OF MESH (Left)  Patient Location: PACU  Anesthesia Type:General  Level of Consciousness: awake, alert , oriented and patient cooperative  Airway & Oxygen Therapy: Patient Spontanous Breathing  Post-op Assessment: Report given to RN, Post -op Vital signs reviewed and stable and Patient moving all extremities  Post vital signs: Reviewed and stable  Last Vitals:  Filed Vitals:   06/24/15 0649  BP: 115/53  Pulse: 56  Temp: 36.1 C  Resp: 20    Complications: No apparent anesthesia complications

## 2015-06-24 NOTE — Anesthesia Procedure Notes (Signed)
Procedure Name: Intubation Date/Time: 06/24/2015 7:40 AM Performed by: Rebekah Chesterfield L Pre-anesthesia Checklist: Patient identified, Emergency Drugs available, Suction available, Patient being monitored and Timeout performed Patient Re-evaluated:Patient Re-evaluated prior to inductionOxygen Delivery Method: Circle system utilized Preoxygenation: Pre-oxygenation with 100% oxygen Intubation Type: IV induction Ventilation: Mask ventilation without difficulty Laryngoscope Size: Mac and 3 Grade View: Grade I Tube type: Oral Tube size: 7.5 mm Number of attempts: 1 Airway Equipment and Method: Stylet Placement Confirmation: ETT inserted through vocal cords under direct vision,  breath sounds checked- equal and bilateral and positive ETCO2 Secured at: 21 cm Tube secured with: Tape Dental Injury: Teeth and Oropharynx as per pre-operative assessment

## 2015-06-24 NOTE — Op Note (Signed)
06/24/2015  Jason Lane [birth year]   PREOPERATIVE DIAGNOSIS: recurrent left inguinal hernia.   POSTOPERATIVE DIAGNOSIS: recurrent left direct inguinal hernia.   PROCEDURE: Laparoscopic repair of recurrent left direct inguinal hernia with  mesh (TAPP).   SURGEON: Leighton Ruff. Redmond Pulling, MD   ASSISTANT SURGEON: None.   ANESTHESIA: General plus local consisting of 0.25% Marcaine with epi. And 40cc exparel  ESTIMATED BLOOD LOSS: Minimal.   FINDINGS: The patient had a recurrent Left direct inguinal hernia.  It was repaired using a LARGE 3DMax Bard mesh  SPECIMEN: none  INDICATIONS FOR PROCEDURE: 82 yo h/o open left inguinal hernia repair without mesh who has had a recurrence which is causing discomfort and desires repair. Please see h&p for additional information. The risks and benefits including but not limited to bleeding, infection, chronic inguinal pain, nerve entrapment, hernia recurrence, mesh complications, hematoma formation, urinary retention, injury to the testicles, numbness in the groin, blood clots, injury to the surrounding structures, and anesthesia risk was discussed with the patient.  DESCRIPTION OF PROCEDURE: After obtaining verbal consent and marking  the left groin in the holding area with the patient confirming the  operative site, the patient was then taken back to the operating room, placed  supine on the operating room table. General endotracheal anesthesia was  established. The patient had emptied their bladder prior to going back to  the operating room. Sequential compression devices were placed. The  abdomen and groin were prepped and draped in the usual standard surgical  fashion with ChloraPrep. The patient received  IV  antibiotics prior to the incision. A surgical time-out was performed.  Local was infiltrated at the base of the umbilicus.   Next, a 1-cm vertical infraumbilical incision was made with a #11 blade. The fascia  was grasped and lifted  anteriorly. Next, the fascia was incised, and  the abdominal cavity was entered. Pursestring suture was placed around  the fascial edges using a 0 Vicryl. A 12-mm Hasson trocar was placed.  Pneumoperitoneum was smoothly established up to a patient pressure of 15  mmHg. Laparoscope was advanced. There was no evidence of a  contralateral hernia. The patient had a defect medial to  the inferior epigastric vessel, consistent with an recurrent left direct  hernia. Two 5-mm trocars were placed, one on the right, one on the left  in the midclavicular line slightly above the level of the umbilicus all  under direct visualization. After local had been infiltrated, I then  made incision along the peritoneum on the left, starting 2 inches above  the anterior superior iliac spine and caring it medial  toward the median umbilical ligament in a lazy S configuration using  Endo Shears with electrocautery. The peritoneal flap was then gently  dissected downward from the anterior abdominal wall taking care not to  injure the inferior epigastric vessels. The pubic bone was identified.  The testicular vessels were identified.  Using  traction and counter traction with short graspers, I reduced the direct hernia/fat in  its entirety.  The testicular vessels had been identified and preserved. The vas deferens was identified and preserved, and the peritoneum was stripped from those to  surrounding structures. I then went about creating a large pocket by  lifting the peritoneum of the pelvic floor. I took great care not to  injure the iliac vessels.   Exparel anesthetic was injected 2 finger breadths below and medial to the anterior superior iliac spine as well as along the left groin prior to  placing the mesh. I then obtained a piece of LARGE Bard 3DMax mesh, placed it through the Hasson trocar, half of it covered medial  to the inferior epigastric vessels and half of it lateral to the  inferior epigastric  vessels. The defect was well  covered with the mesh. I then secured the mesh to the abdominal wall  using an Ethicon secure strap tack. Tacks were placed through  the Cooper's ligament, one tack on each side of the inferior epigastric  vessel and 1 tacks superior-medially. No tacks were placed below the  shelving edge of the inguinal ligament. Pneumoperitoneum was reduced  to 8 mmHg. I then brought the peritoneal flap back up to the abdominal  wall and tacked it to the abdominal wall using 4 tacks. There was no  defect in the peritoneum, and the mesh was well covered. I removed the  Hasson trocar and tied down the previously placed pursestring suture.  The closure was viewed laparoscopically. There was no evidence of  fascial defect. There was no air leak at the umbilicus. There was no  evidence of injury to surrounding structures. Pneumoperitoneum was  released, and the remaining trocars were removed. All skin incisions  were closed with a 4-0 Monocryl in a subcuticular fashion followed by  application of Dermabond. All needle, instrument, and sponge counts  were correct x2. There are no immediate complications. The patient  tolerated the procedure well. The patient was extubated and taken to the  recovery room in stable condition.  Leighton Ruff. Redmond Pulling, MD, FACS General, Bariatric, & Minimally Invasive Surgery [facility], [place]

## 2015-06-24 NOTE — H&P (View-Only) (Signed)
Jason Lane 06/10/2015 2:09 PM Location: [facility] Patient #: Jason Lane DOB: [birth year] Single / Language: Jason Lane / Race: White Male   History of Present Illness Jason Lane [name] MD; 06/10/2015 5:11 PM) The patient is a 82 year old male who presents with an inguinal hernia. He is referred by Dr. Cathlean Lane for evaluation of a recurrent left inguinal hernia. He states that he had a hernia repaired many years ago. He states that mesh was not used. He states that he was clearing some brush and lifting up some heavy logs and immediately felt some groin pain on the left side. This is about 2 months ago. He limited his physical activity since then and as result of the pain has significantly improved and really doesn't bother him that much. There'll be an occasional ache. There is no sharp, shooting, stabbing pain. He does notice a bulge. It comes and goes. He denies any nausea, vomiting, diarrhea or constipation. He does have an enlarged prostate and has nocturia, sensation of not completely emptying his bladder, and trouble starting his stream. He lives by himself. He denies any chest pain, chest pressure, shortness of breath, distal exertion, orthopnea, paroxysmal nocturnal dyspnea, edema, TIAs or amaurosis fugax. He does not smoke. He does not take any blood thinners.   Problem List/Past Medical Jason Lane Jason Derby, MD; 06/10/2015 5:14 PM) UNILATERAL RECURRENT INGUINAL HERNIA WITHOUT OBSTRUCTION OR GANGRENE (K40.91)  Other Problems Jason Curry, MD; 06/10/2015 5:14 PM) Enlarged Prostate High blood pressure Inguinal Hernia Melanoma  Past Surgical History Jason Lane, Jason Lane; 06/10/2015 2:09 PM) Cataract Surgery Left. Colon Polyp Removal - Colonoscopy Colon Polyp Removal - Open Open Inguinal Hernia Surgery Left. Sentinel Lymph Node Biopsy  Diagnostic Studies History Jason Lane, Jason Lane; 06/10/2015 2:09 PM) Colonoscopy 1-5 years ago  Allergies Jason Lane, Jason Lane;  06/10/2015 2:09 PM) No Known Drug Allergies02/11/2015  Medication History Jason Curry, MD; 06/10/2015 5:14 PM) AmLODIPine Besylate (5MG  Tablet, Oral) Active. Medications Reconciled Flomax (0.4MG  Capsule, 1 (one) Capsule Oral daily, Taken starting 06/10/2015) Active. (start taking 2 weeks before hernia surgery)  Social History Jason Lane, Jason Lane; 06/10/2015 2:09 PM) Alcohol use Occasional alcohol use. Caffeine use Coffee. No drug use Tobacco use Former smoker.  Family History Jason Lane, Jason Lane; 06/10/2015 2:09 PM) Colon Cancer Father.    Review of Systems Jason Lane Jason Lane; 06/10/2015 2:09 PM) General Not Present- Appetite Loss, Chills, Fatigue, Fever, Night Sweats, Weight Gain and Weight Loss. Skin Present- Change in Wart/Mole. Not Present- Dryness, Hives, Jaundice, New Lesions, Non-Healing Wounds, Rash and Ulcer. HEENT Not Present- Earache, Hearing Loss, Hoarseness, Nose Bleed, Oral Ulcers, Ringing in the Ears, Seasonal Allergies, Sinus Pain, Sore Throat, Visual Disturbances, Wears glasses/contact lenses and Yellow Eyes. Respiratory Present- Snoring. Not Present- Bloody sputum, Chronic Cough, Difficulty Breathing and Wheezing. Breast Not Present- Breast Mass, Breast Pain, Nipple Discharge and Skin Changes. Cardiovascular Not Present- Chest Pain, Difficulty Breathing Lying Down, Leg Cramps, Palpitations, Rapid Heart Rate, Shortness of Breath and Swelling of Extremities. Gastrointestinal Not Present- Abdominal Pain, Bloating, Bloody Stool, Change in Bowel Habits, Chronic diarrhea, Constipation, Difficulty Swallowing, Excessive gas, Gets full quickly at meals, Hemorrhoids, Indigestion, Nausea, Rectal Pain and Vomiting. Male Genitourinary Present- Change in Urinary Stream, Frequency and Urgency. Not Present- Blood in Urine, Impotence, Nocturia, Painful Urination and Urine Leakage. Musculoskeletal Present- Muscle Weakness. Not Present- Back Pain, Joint Pain, Joint Stiffness, Muscle Pain and  Swelling of Extremities. Neurological Present- Decreased Memory. Not Present- Fainting, Headaches, Numbness, Seizures, Tingling, Tremor, Trouble walking and Weakness.  Psychiatric Not Present- Anxiety, Bipolar, Change in Sleep Pattern, Depression, Fearful and Frequent crying. Endocrine Not Present- Cold Intolerance, Excessive Hunger, Hair Changes, Heat Intolerance, Hot flashes and New Diabetes. Hematology Not Present- Easy Bruising, Excessive bleeding, Gland problems, HIV and Persistent Infections.  Vitals Jason Lane Jason Lane; 06/10/2015 2:10 PM) 06/10/2015 2:10 PM Weight: 144 lb Height: 66in Body Surface Area: 1.74 m Body Mass Index: 23.24 kg/m  Temp.: 97.69F(Temporal)  Pulse: 56 (Regular)  BP: 122/64 (Sitting, Left Arm, Standard)       Physical Exam Jason Lane M. Jason Corrales MD; 06/10/2015 5:12 PM) General Mental Status-Alert. General Appearance-Consistent with stated age. Hydration-Well hydrated. Voice-Normal.  Head and Neck Head-normocephalic, atraumatic with no lesions or palpable masses. Trachea-midline. Thyroid Gland Characteristics - normal size and consistency.  Eye Eyeball - Bilateral-Extraocular movements intact. Sclera/Conjunctiva - Bilateral-No scleral icterus.  Chest and Lung Exam Chest and lung exam reveals -quiet, even and easy respiratory effort with no use of accessory muscles and on auscultation, normal breath sounds, no adventitious sounds and normal vocal resonance. Inspection Chest Wall - Normal. Back - normal.  Breast - Did not examine.  Cardiovascular Cardiovascular examination reveals -normal heart sounds, regular rate and rhythm with no murmurs and normal pedal pulses bilaterally.  Abdomen Inspection Inspection of the abdomen reveals - No Hernias. Skin - Scar - no surgical scars. Palpation/Percussion Palpation and Percussion of the abdomen reveal - Soft, Non Tender, No Rebound tenderness, No Rigidity (guarding) and No  hepatosplenomegaly. Auscultation Auscultation of the abdomen reveals - Bowel sounds normal.  Male Genitourinary Note: Mildly atrophic testicles bilaterally. Old left inguinal scar. Patient examined supine and standing. Obvious small left groin bulge, easily reducible. No evidence of right inguinal hernia. No enlarged ring.   Peripheral Vascular Upper Extremity Palpation - Pulses bilaterally normal.  Neurologic Neurologic evaluation reveals -alert and oriented x 3 with no impairment of recent or remote memory. Mental Status-Normal.  Neuropsychiatric The patient's mood and affect are described as -normal. Judgment and Insight-insight is appropriate concerning matters relevant to self.  Musculoskeletal Normal Exam - Left-Upper Extremity Strength Normal and Lower Extremity Strength Normal. Normal Exam - Right-Upper Extremity Strength Normal and Lower Extremity Strength Normal.  Lymphatic Head & Neck  General Head & Neck Lymphatics: Bilateral - Description - Normal. Axillary - Did not examine. Femoral & Inguinal - Did not examine.    Assessment & Plan Jason Lane M. Latasha Puskas MD; 06/10/2015 5:14 PM) UNILATERAL RECURRENT INGUINAL HERNIA WITHOUT OBSTRUCTION OR GANGRENE (K40.91) Impression: We did discuss nonsurgical versus surgical management. We discussed using a hernia belt. With respect to surgery I recommended a laparoscopic approach since this is a recurrent inguinal hernia.  We discussed the etiology of inguinal hernias. We discussed the signs & symptoms of incarceration & strangulation.  The patient has elected to proceed with laparoscopic repair of left recurrent inguinal hernia with mesh  I described the procedure in detail. The patient was given educational material. We discussed the risks and benefits including but not limited to bleeding, infection, chronic inguinal pain, nerve entrapment, hernia recurrence, mesh complications, hematoma formation, urinary retention,  injury to the testicle, numbness in the groin, blood clots, injury to the surrounding structures, and anesthesia risk. We also discussed the typical post operative recovery course, including no heavy lifting for 4-6 weeks. I explained that the likelihood of improvement of their symptoms is good.  Because of his BPH issues this does make him at higher risk for postoperative urinary retention. I will place him on perioperative Flomax to try to  decrease his risk Current Plans You are being scheduled for surgery - Our schedulers will call you.  You should hear from our office's scheduling department within 5 working days about the location, date, and time of surgery. We try to make accommodations for patient's preferences in scheduling surgery, but sometimes the OR schedule or the surgeon's schedule prevents [place] from making those accommodations.  If you have not heard from our office [phone]) in 5 working days, call the office and ask for your surgeon's nurse.  If you have other questions about your diagnosis, plan, or surgery, call the office and ask for your surgeon's nurse.  Pickup prescription for Flomax and start taking at least 2 weeks before surgery including the morning of  Pt Education - Pamphlet Given - Laparoscopic Hernia Repair: discussed with patient and provided information. Started Flomax 0.4MG , 1 (one) Capsule daily, #30, 06/10/2015, No Refill. Local Order: start taking 2 weeks before hernia surgery  Leighton Ruff. Redmond Pulling, MD, FACS General, Bariatric, & Minimally Invasive Surgery [facility], [place]

## 2015-06-24 NOTE — Anesthesia Postprocedure Evaluation (Signed)
Anesthesia Post Note  Patient: Jason Lane  Procedure(s) Performed: Procedure(s) (LRB): LAPAROSCOPIC REPAIR RECURRENT LEFT INGUINAL HERNIA (Left) INSERTION OF MESH (Left)  Patient location during evaluation: PACU Anesthesia Type: General Level of consciousness: sedated Pain management: satisfactory to patient Vital Signs Assessment: post-procedure vital signs reviewed and stable Respiratory status: spontaneous breathing Cardiovascular status: stable Anesthetic complications: no    Last Vitals:  Filed Vitals:   06/24/15 1030 06/24/15 1100  BP: 99/54 95/49  Pulse: 53 54  Temp:    Resp: 19 16    Last Pain: There were no vitals filed for this visit.               Riccardo Dubin

## 2015-06-25 ENCOUNTER — Encounter (HOSPITAL_COMMUNITY): Payer: Self-pay | Admitting: General Surgery

## 2015-06-25 DIAGNOSIS — Z87891 Personal history of nicotine dependence: Secondary | ICD-10-CM | POA: Diagnosis not present

## 2015-06-25 DIAGNOSIS — R351 Nocturia: Secondary | ICD-10-CM | POA: Diagnosis not present

## 2015-06-25 DIAGNOSIS — K4091 Unilateral inguinal hernia, without obstruction or gangrene, recurrent: Secondary | ICD-10-CM | POA: Diagnosis not present

## 2015-06-25 DIAGNOSIS — J45909 Unspecified asthma, uncomplicated: Secondary | ICD-10-CM | POA: Diagnosis not present

## 2015-06-25 DIAGNOSIS — N401 Enlarged prostate with lower urinary tract symptoms: Secondary | ICD-10-CM | POA: Diagnosis not present

## 2015-06-25 DIAGNOSIS — R3914 Feeling of incomplete bladder emptying: Secondary | ICD-10-CM | POA: Diagnosis not present

## 2015-06-25 LAB — BASIC METABOLIC PANEL
ANION GAP: 7 (ref 5–15)
BUN: 10 mg/dL (ref 6–20)
CO2: 27 mmol/L (ref 22–32)
Calcium: 9.2 mg/dL (ref 8.9–10.3)
Chloride: 103 mmol/L (ref 101–111)
Creatinine, Ser: 0.81 mg/dL (ref 0.61–1.24)
Glucose, Bld: 108 mg/dL — ABNORMAL HIGH (ref 65–99)
POTASSIUM: 4.4 mmol/L (ref 3.5–5.1)
SODIUM: 137 mmol/L (ref 135–145)

## 2015-06-25 LAB — CBC
HEMATOCRIT: 40.3 % (ref 39.0–52.0)
HEMOGLOBIN: 13.5 g/dL (ref 13.0–17.0)
MCH: 31.8 pg (ref 26.0–34.0)
MCHC: 33.5 g/dL (ref 30.0–36.0)
MCV: 95 fL (ref 78.0–100.0)
Platelets: 367 10*3/uL (ref 150–400)
RBC: 4.24 MIL/uL (ref 4.22–5.81)
RDW: 15 % (ref 11.5–15.5)
WBC: 9.8 10*3/uL (ref 4.0–10.5)

## 2015-06-25 MED ORDER — TAMSULOSIN HCL 0.4 MG PO CAPS: 0.4000 mg | ORAL_CAPSULE | Freq: Every day | ORAL | Status: DC

## 2015-06-25 MED ORDER — OXYCODONE HCL 5 MG PO TABS: 5.0000 mg | ORAL_TABLET | ORAL | Status: DC | PRN

## 2015-06-25 NOTE — Progress Notes (Signed)
Pt discharged home with his friend in stable condition. Discharge instructions given with no concerns voiced. Pt discharged with foley d/t urinary retention

## 2015-06-25 NOTE — Care Management Obs Status (Signed)
[place] NOTIFICATION   Patient Details  Name: Jason Lane MRN: [id number] Date of Birth: 10/31/1927   Medicare Observation Status Notification Given:  Yes    Marilu Favre, RN 06/25/2015, 9:36 AM

## 2015-06-25 NOTE — Progress Notes (Signed)
PT still unable to void on his own. In and out cath done 2x at 6p and 12 midnight. Pt fells miserable and hurting. Refused pain med. Bladder scan done at 0325 shows 735ml in the bladder. Foley cath inserted and obtained 556ml of urine. Pt fells much better.

## 2015-06-25 NOTE — Discharge Instructions (Signed)
[facility]  UMBILICAL OR INGUINAL HERNIA REPAIR: POST OP INSTRUCTIONS  Always review your discharge instruction sheet given to you by the facility where your surgery was performed. IF YOU HAVE DISABILITY OR FAMILY LEAVE FORMS, YOU MUST BRING THEM TO THE OFFICE FOR PROCESSING.   DO NOT GIVE THEM TO YOUR DOCTOR.  1. A  prescription for pain medication may be given to you upon discharge.  Take your pain medication as prescribed, if needed.  If narcotic pain medicine is not needed, then you may take acetaminophen (Tylenol) or ibuprofen (Advil) as needed. 2. Take your usually prescribed medications unless otherwise directed. 3. If you need a refill on your pain medication, please contact your pharmacy.  They will contact our office to request authorization. Prescriptions will not be filled after 5 pm or on week-ends. 4. You should follow a light diet the first 24 hours after arrival home, such as soup and crackers, etc.  Be sure to include lots of fluids daily.  Resume your normal diet the day after surgery. 5. Most patients will experience some swelling and bruising around the umbilicus or in the groin and scrotum.  Ice packs and reclining will help.  Swelling and bruising can take several days to resolve.  6. It is common to experience some constipation if taking pain medication after surgery.  Increasing fluid intake and taking a stool softener (such as Colace) will usually help or prevent this problem from occurring.  A mild laxative (Milk of Magnesia or Miralax) should be taken according to package directions if there are no bowel movements after 48 hours. 7.  If your surgeon used skin glue on the incision, you may shower in 24 hours.  The glue will flake off over the next 2-3 weeks.  Any sutures or staples will be removed at the office during your follow-up visit. 8. ACTIVITIES:  You may resume regular (light) daily activities beginning the next day--such as daily self-care,  walking, climbing stairs--gradually increasing activities as tolerated.  You may have sexual intercourse when it is comfortable.  Refrain from any heavy lifting or straining until approved by your doctor. a. You may drive when you are no longer taking prescription pain medication, you can comfortably wear a seatbelt, and you can safely maneuver your car and apply brakes. b. RETURN TO WORK: 2 weeks WITH RESTRICTIONS 9. You should see your doctor in the office for a follow-up appointment approximately 2-3 weeks after your surgery.  Make sure that you call for this appointment within a day or two after you arrive home to insure a convenient appointment time. 10. OTHER INSTRUCTIONS: DO NOT LIFT, PUSH, OR PULL ANYTHING GREATER THAN 10 POUNDS FOR 6 WEEKS    WHEN TO CALL YOUR DOCTOR: 1. Fever over 101.0 2. Inability to urinate 3. Nausea and/or vomiting 4. Extreme swelling or bruising 5. Continued bleeding from incision. 6. Increased pain, redness, or drainage from the incision  The clinic staff is available to answer your questions during regular business hours.  Please dont hesitate to call and ask to speak to one of the nurses for clinical concerns.  If you have a medical emergency, go to the nearest emergency room or call 911.  A surgeon from [facility] is always on call at the hospital   [address], [place], [place], [place]  [address] ?  [address], [place], [place]   [address] [phone] ? [phone] ? FAX [phone] Web site: [email or web address]  Foley Catheter Care, Adult A Foley catheter is a soft, flexible tube that is placed into the bladder to drain urine. A Foley catheter may be inserted if:  You leak urine or are not able to control when you urinate (urinary incontinence).  You are not able to urinate when you need to (urinary retention).  You had prostate surgery or surgery on the genitals.  You have certain medical conditions,  such as multiple sclerosis, dementia, or a spinal cord injury. If you are going home with a Foley catheter in place, follow the instructions below. TAKING CARE OF THE CATHETER 1. Wash your hands with soap and water. 2. Using mild soap and warm water on a clean washcloth:  Clean the area on your body closest to the catheter insertion site using a circular motion, moving away from the catheter. Never wipe toward the catheter because this could sweep bacteria up into the urethra and cause infection.  Remove all traces of soap. Pat the area dry with a clean towel. For males, reposition the foreskin. 3. Attach the catheter to your leg so there is no tension on the catheter. Use adhesive tape or a leg strap. If you are using adhesive tape, remove any sticky residue left behind by the previous tape you used. 4. Keep the drainage bag below the level of the bladder, but keep it off the floor. 5. Check throughout the day to be sure the catheter is working and urine is draining freely. Make sure the tubing does not become kinked. 6. Do not pull on the catheter or try to remove it. Pulling could damage internal tissues. TAKING CARE OF THE DRAINAGE BAGS You will be given two drainage bags to take home. One is a large overnight drainage bag, and the other is a smaller leg bag that fits underneath clothing. You may wear the overnight bag at any time, but you should never wear the smaller leg bag at night. Follow the instructions below for how to empty, change, and clean your drainage bags. Emptying the Drainage Bag You must empty your drainage bag when it is  - full or at least 2-3 times a day. 1. Wash your hands with soap and water. 2. Keep the drainage bag below your hips, below the level of your bladder. This stops urine from going back into the tubing and into your bladder. 3. Hold the dirty bag over the toilet or a clean container. 4. Open the pour spout at the bottom of the bag and empty the urine into  the toilet or container. Do not let the pour spout touch the toilet, container, or any other surface. Doing so can place bacteria on the bag, which can cause an infection. 5. Clean the pour spout with a gauze pad or cotton ball that has rubbing alcohol on it. 6. Close the pour spout. 7. Attach the bag to your leg with adhesive tape or a leg strap. 8. Wash your hands well. Changing the Drainage Bag Change your drainage bag once a month or sooner if it starts to smell bad or look dirty. Below are steps to follow when changing the drainage bag. 1. Wash your hands with soap and water. 2. Pinch off the rubber catheter so that urine does not spill out. 3. Disconnect the catheter tube from the drainage tube at the connection valve. Do not let the tubes touch any surface. 4. Clean the end of the catheter tube with an alcohol wipe. Use a different alcohol wipe to  clean the end of the drainage tube. 5. Connect the catheter tube to the drainage tube of the clean drainage bag. 6. Attach the new bag to the leg with adhesive tape or a leg strap. Avoid attaching the new bag too tightly. 7. Wash your hands well. Cleaning the Drainage Bag 1. Wash your hands with soap and water. 2. Wash the bag in warm, soapy water. 3. Rinse the bag thoroughly with warm water. 4. Fill the bag with a solution of white vinegar and water (1 cup vinegar to 1 qt warm water [.2 L vinegar to 1 L warm water]). Close the bag and soak it for 30 minutes in the solution. 5. Rinse the bag with warm water. 6. Hang the bag to dry with the pour spout open and hanging downward. 7. Store the clean bag (once it is dry) in a clean plastic bag. 8. Wash your hands well. PREVENTING INFECTION  Wash your hands before and after handling your catheter.  Take showers daily and wash the area where the catheter enters your body. Do not take baths. Replace wet leg straps with dry ones, if this applies.  Do not use powders, sprays, or lotions on the  genital area. Only use creams, lotions, or ointments as directed by your caregiver.  For females, wipe from front to back after each bowel movement.  Drink enough fluids to keep your urine clear or pale yellow unless you have a fluid restriction.  Do not let the drainage bag or tubing touch or lie on the floor.  Wear cotton underwear to absorb moisture and to keep your skin drier. SEEK MEDICAL CARE IF:   Your urine is cloudy or smells unusually bad.  Your catheter becomes clogged.  You are not draining urine into the bag or your bladder feels full.  Your catheter starts to leak. SEEK IMMEDIATE MEDICAL CARE IF:   You have pain, swelling, redness, or pus where the catheter enters the body.  You have pain in the abdomen, legs, lower back, or bladder.  You have a fever.  You see blood fill the catheter, or your urine is pink or red.  You have nausea, vomiting, or chills.  Your catheter gets pulled out. MAKE SURE YOU:   Understand these instructions.  Will watch your condition.  Will get help right away if you are not doing well or get worse.   This information is not intended to replace advice given to you by your health care provider. Make sure you discuss any questions you have with your health care provider.   Document Released: 04/19/2005 Document Revised: 09/03/2013 Document Reviewed: 04/10/2012 Elsevier Interactive Patient Education [employer].

## 2015-06-25 NOTE — Discharge Summary (Signed)
Physician Discharge Summary  Jason Lane [id number] DOB: 27-Aug-1927 DOA: 06/24/2015  PCP: Cathlean Cower, MD  Admit date: 06/24/2015 Discharge date: 06/25/2015  Recommendations for Outpatient Follow-up:    Follow-up Information    Follow up with [name], MD. Schedule an appointment as soon as possible for a visit on 07/17/2015.   Specialty:  General Surgery   Why:  10:15 AM; arrive at 10 AM   Contact information:   [place] [address] [phone]       Follow up with [facility]   Why:  They will call you with appt for foley removal   Contact information:   [address]  [address] [place] [address] [phone]      Discharge Diagnoses:  Left direct inguinal hernia s/p repair Postoperative urinary retention HTN BPH  Surgical Procedure: LAPAROSCOPIC REPAIR OF LEFT DIRECT INGUINAL HERNIA WITH MESH  Discharge Condition: good Disposition: home with foley leg bag  Diet recommendation: cardiac  Filed Weights   06/24/15 0649  Weight: 66.906 kg (147 lb 8 oz)   Hospital Course:  He presented for planned repair of his left inguinal hernia.  Please see operative note for further details.  He lives alone and was kept overnight for observation. Despite preoperative Flomax and minimizing IV fluids he went into urinary retention. He underwent in and out catheterization twice  On the third time the Foley was left in.  On postoperative day 1 he was doing well.  He had minimal to no pain.  He was tolerating a diet.  He was ambulating without difficulty.  Nursing staff had instructed him on Foley leg bag care.  We discussed discharge instructions. We made arrangements for him to follow up with [facility].  They are going to contact the patient with his follow-up appointment.  BP 109/61 mmHg  Pulse 51  Temp(Src) 97.6 F (36.4 C) (Oral)  Resp 19  Ht 5\' 8"  (1.727 m)  Wt 66.906 kg (147 lb 8 oz)  BMI 22.43 kg/m2  SpO2  97%  Gen: alert, NAD, non-toxic appearing Pupils: equal, no scleral icterus Pulm: Lungs clear to auscultation, symmetric chest rise CV: regular rate and rhythm Abd: soft, nontender, nondistended. Some bruising around trocar sites. No hematoma. No cellulitis. No incisional hernia Ext: no edema, no calf tenderness Skin: no rash, no jaundice   Discharge Instructions  Discharge Instructions    Call MD for:  hives    Complete by:  As directed      Call MD for:  persistant dizziness or light-headedness    Complete by:  As directed      Call MD for:  persistant nausea and vomiting    Complete by:  As directed      Call MD for:  redness, tenderness, or signs of infection (pain, swelling, redness, odor or green/yellow discharge around incision site)    Complete by:  As directed      Call MD for:  severe uncontrolled pain    Complete by:  As directed      Call MD for:    Complete by:  As directed   Temperature >101     Diet - low sodium heart healthy    Complete by:  As directed      Discharge instructions    Complete by:  As directed   See [facility] discharge instructions [facility] will call you with follow up for foley     Increase  activity slowly    Complete by:  As directed             Medication List    TAKE these medications        amLODipine 5 MG tablet  Commonly known as:  NORVASC  TAKE 1/2 TABLET DAILY     B COMPLEX 100 PO  Take by mouth daily.     Cinnamon 500 MG capsule  Take 500 mg by mouth daily.     co-enzyme Q-10 30 MG capsule  Take 30 mg by mouth 3 (three) times daily.     multivitamin capsule  Take 1 capsule by mouth daily.     NETTLE-PYGEUM AFRICANUM PO  Take 1 capsule by mouth daily.     oxyCODONE 5 MG immediate release tablet  Commonly known as:  Oxy IR/ROXICODONE  Take 1 tablet (5 mg total) by mouth every 4 (four) hours as needed for moderate pain.     saw palmetto 500 MG capsule  Take 500 mg by mouth daily.     tamsulosin 0.4 MG Caps  capsule  Commonly known as:  FLOMAX  Take 1 capsule (0.4 mg total) by mouth daily. To be taken times 10 days     VITAMIN D-400 400 units Tabs tablet  Generic drug:  cholecalciferol  Take 400 Units by mouth daily.           Follow-up Information    Follow up with [name], MD. Schedule an appointment as soon as possible for a visit on 07/17/2015.   Specialty:  General Surgery   Why:  10:15 AM; arrive at 10 AM   Contact information:   [place] [address] [phone]       Follow up with [facility]   Why:  They will call you with appt for foley removal   Contact information:   [address]  [address]  [place] [address] [phone]        The results of significant diagnostics from this hospitalization (including imaging, microbiology, ancillary and laboratory) are listed below for reference.    Significant Diagnostic Studies: No results found.  Microbiology: No results found for this or any previous visit (from the past 240 hour(s)).   Labs: Basic Metabolic Panel:  Recent Labs Lab 06/18/15 1423 06/25/15 0619  NA 142 137  K 4.5 4.4  CL 104 103  CO2 28 27  GLUCOSE 112* 108*  BUN 15 10  CREATININE 0.80 0.81  CALCIUM 9.9 9.2   Liver Function Tests:  Recent Labs Lab 06/18/15 1423  AST 30  ALT 25  ALKPHOS 99  BILITOT 0.8  PROT 7.2  ALBUMIN 4.0   No results for input(s): LIPASE, AMYLASE in the last 168 hours. No results for input(s): AMMONIA in the last 168 hours. CBC:  Recent Labs Lab 06/18/15 1423 06/25/15 0619  WBC 10.6* 9.8  HGB 15.9 13.5  HCT 47.8 40.3  MCV 96.2 95.0  PLT 386 367   Cardiac Enzymes: No results for input(s): CKTOTAL, CKMB, CKMBINDEX, TROPONINI in the last 168 hours. BNP: BNP (last 3 results) No results for input(s): BNP in the last 8760 hours.  ProBNP (last 3 results) No results for input(s): PROBNP in the last 8760 hours.  CBG: No results for input(s): GLUCAP in  the last 168 hours.  Active Problems:   S/P laparoscopic hernia repair   Time coordinating discharge: 20 minutes  Signed:  [name], MD  [facility], [place] [phone] 06/25/2015, 10:56 AM

## 2015-06-26 ENCOUNTER — Telehealth: Payer: Self-pay | Admitting: *Deleted

## 2015-06-26 ENCOUNTER — Ambulatory Visit: Payer: Medicare Other | Admitting: Internal Medicine

## 2015-06-26 NOTE — Telephone Encounter (Signed)
Pt was on TCM list admitted for Laparoscopic repair (L) direct Inguinal hernia mesh. D/C 06/25/15 will be f/u w/surgeon Dr. Redmond Pulling 07/17/15....Jason Lane

## 2015-07-02 DIAGNOSIS — Z0000 Encounter for general adult medical examination without abnormal findings: Secondary | ICD-10-CM | POA: Diagnosis not present

## 2015-07-02 DIAGNOSIS — R338 Other retention of urine: Secondary | ICD-10-CM | POA: Diagnosis not present

## 2015-07-02 DIAGNOSIS — R351 Nocturia: Secondary | ICD-10-CM | POA: Diagnosis not present

## 2015-07-02 DIAGNOSIS — N401 Enlarged prostate with lower urinary tract symptoms: Secondary | ICD-10-CM | POA: Diagnosis not present

## 2015-07-09 DIAGNOSIS — R338 Other retention of urine: Secondary | ICD-10-CM | POA: Diagnosis not present

## 2015-07-09 DIAGNOSIS — Z0000 Encounter for general adult medical examination without abnormal findings: Secondary | ICD-10-CM | POA: Diagnosis not present

## 2015-07-09 DIAGNOSIS — N401 Enlarged prostate with lower urinary tract symptoms: Secondary | ICD-10-CM | POA: Diagnosis not present

## 2015-07-09 DIAGNOSIS — R351 Nocturia: Secondary | ICD-10-CM | POA: Diagnosis not present

## 2015-07-25 DIAGNOSIS — L821 Other seborrheic keratosis: Secondary | ICD-10-CM | POA: Diagnosis not present

## 2015-07-25 DIAGNOSIS — D485 Neoplasm of uncertain behavior of skin: Secondary | ICD-10-CM | POA: Diagnosis not present

## 2015-07-25 DIAGNOSIS — D225 Melanocytic nevi of trunk: Secondary | ICD-10-CM | POA: Diagnosis not present

## 2015-07-25 DIAGNOSIS — L820 Inflamed seborrheic keratosis: Secondary | ICD-10-CM | POA: Diagnosis not present

## 2015-07-28 DIAGNOSIS — C4442 Squamous cell carcinoma of skin of scalp and neck: Secondary | ICD-10-CM | POA: Diagnosis not present

## 2015-08-04 DIAGNOSIS — C4442 Squamous cell carcinoma of skin of scalp and neck: Secondary | ICD-10-CM | POA: Diagnosis not present

## 2015-08-12 ENCOUNTER — Ambulatory Visit: Payer: Medicare Other | Admitting: Internal Medicine

## 2015-11-28 ENCOUNTER — Other Ambulatory Visit: Payer: Self-pay | Admitting: Internal Medicine

## 2015-11-28 ENCOUNTER — Telehealth: Payer: Self-pay | Admitting: Emergency Medicine

## 2015-11-28 NOTE — Telephone Encounter (Signed)
Pharmacy is requesting to switch the prescription  amLODipine (NORVASC) 5 MG tablet to amLODipine (NORVASC) 2.5 MG tablet  So the patient doesn't have to cut them in half. Please follow up thanks.

## 2015-12-01 MED ORDER — AMLODIPINE BESYLATE 2.5 MG PO TABS: 2.5000 mg | ORAL_TABLET | Freq: Every day | ORAL | 3 refills | Status: DC

## 2015-12-01 NOTE — Telephone Encounter (Signed)
Done erx 

## 2015-12-31 ENCOUNTER — Encounter: Payer: Self-pay | Admitting: Internal Medicine

## 2015-12-31 ENCOUNTER — Ambulatory Visit (INDEPENDENT_AMBULATORY_CARE_PROVIDER_SITE_OTHER): Payer: Medicare Other | Admitting: Internal Medicine

## 2015-12-31 ENCOUNTER — Telehealth: Payer: Self-pay | Admitting: Emergency Medicine

## 2015-12-31 VITALS — BP 124/70 | HR 58 | Temp 98.2°F | Resp 20 | Wt 140.0 lb

## 2015-12-31 DIAGNOSIS — E785 Hyperlipidemia, unspecified: Secondary | ICD-10-CM

## 2015-12-31 DIAGNOSIS — R079 Chest pain, unspecified: Secondary | ICD-10-CM

## 2015-12-31 DIAGNOSIS — J4520 Mild intermittent asthma, uncomplicated: Secondary | ICD-10-CM

## 2015-12-31 DIAGNOSIS — I10 Essential (primary) hypertension: Secondary | ICD-10-CM | POA: Diagnosis not present

## 2015-12-31 MED ORDER — AMLODIPINE BESYLATE 2.5 MG PO TABS: 2.5000 mg | ORAL_TABLET | Freq: Every day | ORAL | 3 refills | Status: DC

## 2015-12-31 MED ORDER — TAMSULOSIN HCL 0.4 MG PO CAPS: 0.4000 mg | ORAL_CAPSULE | Freq: Every day | ORAL | 3 refills | Status: DC

## 2015-12-31 NOTE — Progress Notes (Signed)
Pre visit review using our clinic review tool, if applicable. No additional management support is needed unless otherwise documented below in the visit note. 

## 2015-12-31 NOTE — Patient Instructions (Signed)
Please continue all other medications as before, and refills have been done if requested.  Please have the pharmacy call with any other refills you may need.  Please continue your efforts at being more active, low cholesterol diet, and weight control.  You are otherwise up to date with prevention measures today.  Please keep your appointments with your specialists as you may have planned  Please call if you change your mind about having the streess test and/or referral to cardiology  Please return in 6 months, or sooner if needed

## 2015-12-31 NOTE — Telephone Encounter (Signed)
Pharmacy called and needs clarification on the directions for tamsulosin (FLOMAX) 0.4 MG CAPS capsule. Please follow up and call them back thanks.

## 2015-12-31 NOTE — Progress Notes (Signed)
Subjective:    Patient ID: Jason Lane, male    DOB: [birth year], 82 y.o.   MRN: [id number]  HPI  Here for yearly f/u;  Overall doing ok;  Pt denies Chest  worsening SOB, DOE, wheezing, orthopnea, PND, worsening LE edema, palpitations, dizziness or syncope.. But has had unusual 2 mo intermittent dull mid sscp asso with curious numbness to left medial hand and left jaw.  No n/v, palps, dizziness or diaphoresis   Pt denies neurological change such as new headache, facial or extremity weakness .  Pt denies polydipsia, polyuria, or low sugar symptoms. Pt states overall good compliance with treatment and medications, good tolerability, and has been trying to follow appropriate diet.  Pt denies worsening depressive symptoms, suicidal ideation or panic. No fever, night sweats, wt loss, loss of appetite, or other constitutional symptoms.  Pt states good ability with ADL's, has low fall risk, home safety reviewed and adequate, no other significant changes in hearing or vision, and only occasionally active with exercise due to recent surguries. S/p scalp surguryfor skin cancer and hernia repair.  Past Medical History:  Diagnosis Date  . ANEMIA-IRON DEFICIENCY 07/08/2010  . ASTHMA 12/03/2009   States no history to asthma  . BENIGN PROSTATIC HYPERTROPHY 12/03/2009  . Cancer ([facility])    skin cancer  . DIVERTICULOSIS, COLON 12/03/2009  . FATIGUE 12/03/2009  . GERD 12/03/2009  . HYPERLIPIDEMIA 12/03/2009  . HYPERTENSION 02/01/2010  . HYPERTENSION NEC 12/03/2009  . MALIGNANT MELANOMA, HX OF 12/03/2009  . SKIN LESION 12/03/2009  . TOBACCO USE, QUIT 12/03/2009  . TREMOR 12/03/2009   Past Surgical History:  Procedure Laterality Date  . COLONOSCOPY    . HERNIA REPAIR    . INGUINAL HERNIA REPAIR Left 06/24/2015   Procedure: LAPAROSCOPIC REPAIR RECURRENT LEFT INGUINAL HERNIA;  Surgeon: Greer Pickerel, MD;  Location: [facility];  Service: General;  Laterality: Left;  . inguinal herniorrhapy  1970   right  . INSERTION OF MESH Left  06/24/2015   Procedure: INSERTION OF MESH;  Surgeon: Greer Pickerel, MD;  Location: [facility];  Service: General;  Laterality: Left;  . s/p left leg melanoma      reports that he has quit smoking. He has never used smokeless tobacco. He reports that he drinks about 4.2 oz of alcohol per week . He reports that he does not use drugs. family history includes Colon cancer in his father. No Known Allergies Current Outpatient Prescriptions on File Prior to Visit  Medication Sig Dispense Refill  . B Complex Vitamins (B COMPLEX 100 PO) Take by mouth daily.      . cholecalciferol (VITAMIN D-400) 400 UNITS TABS Take 400 Units by mouth daily.     . Cinnamon 500 MG capsule Take 500 mg by mouth daily.      [name] co-enzyme Q-10 30 MG capsule Take 30 mg by mouth 3 (three) times daily.    . Multiple Vitamin (MULTIVITAMIN) capsule Take 1 capsule by mouth daily.      [name] NETTLE-PYGEUM AFRICANUM PO Take 1 capsule by mouth daily.    [name] oxyCODONE (OXY IR/ROXICODONE) 5 MG immediate release tablet Take 1 tablet (5 mg total) by mouth every 4 (four) hours as needed for moderate pain. 30 tablet 0  . saw palmetto 500 MG capsule Take 500 mg by mouth daily.     No current facility-administered medications on file prior to visit.     Review of Systems  Constitutional: Negative for unusual diaphoresis or night sweats HENT: Negative  for ear swelling or discharge Eyes: Negative for worsening visual haziness  Respiratory: Negative for choking and stridor.   Gastrointestinal: Negative for distension or worsening eructation Genitourinary: Negative for retention or change in urine volume.  Musculoskeletal: Negative for other MSK pain or swelling Skin: Negative for color change and worsening wound Neurological: Negative for tremors and numbness other than noted  Psychiatric/Behavioral: Negative for decreased concentration or agitation other than above       Objective:   Physical Exam BP 124/70   Pulse (!) 58   Temp 98.2 F (36.8  C) (Oral)   Resp 20   Wt 140 lb (63.5 kg)   SpO2 96%   BMI 21.29 kg/m  VS noted,  Constitutional: Pt is oriented to person, place, and time. Appears well-developed and well-nourished, in no significant distress Head: Normocephalic and atraumatic  Eyes: Conjunctivae and EOM are normal. Pupils are equal, round, and reactive to light Right Ear: External ear normal.  Left Ear: External ear normal Nose: Nose normal.  Mouth/Throat: Oropharynx is clear and moist  Neck: Normal range of motion. Neck supple. No JVD present. No tracheal deviation present or significant neck LA or mass Cardiovascular: Normal rate, regular rhythm, normal heart sounds and intact distal pulses.   Pulmonary/Chest: Effort normal and breath sounds without rales or wheezing  Abdominal: Soft. Bowel sounds are normal. NT. No HSM  Musculoskeletal: Normal range of motion. Exhibits no edema Lymphadenopathy: Has no cervical adenopathy.  Neurological: Pt is alert and oriented to person, place, and time. Pt has normal reflexes. No cranial nerve deficit. Motor grossly intact Skin: Skin is warm and dry. No rash noted or new ulcers Psychiatric:  Has milnervous mood and affect. Behavior is normal.     Lab Results  Component Value Date   WBC 9.8 06/25/2015   HGB 13.5 06/25/2015   HCT 40.3 06/25/2015   PLT 367 06/25/2015   GLUCOSE 108 (H) 06/25/2015   CHOL 126 12/24/2014   TRIG 98.0 12/24/2014   HDL 31.30 (L) 12/24/2014   LDLCALC 76 12/24/2014   ALT 25 06/18/2015   AST 30 06/18/2015   NA 137 06/25/2015   K 4.4 06/25/2015   CL 103 06/25/2015   CREATININE 0.81 06/25/2015   BUN 10 06/25/2015   CO2 27 06/25/2015   TSH 2.47 12/24/2014   PSA 3.85 12/15/2011   INR 1.2 (H) 12/15/2011    Declines ecg today

## 2016-01-04 NOTE — Assessment & Plan Note (Signed)
stable overall by history and exam, recent data reviewed with pt, and pt to continue medical treatment as before,  to f/u any worsening symptoms or concerns @LASTSAO2(3)@  

## 2016-01-04 NOTE — Assessment & Plan Note (Signed)
Hx quite concerning for symptomatic CAD, pt declines ecg or referral for stress testing or cardiology evaluation, though I strongly encouraged this

## 2016-01-04 NOTE — Assessment & Plan Note (Signed)
Lab Results  Component Value Date   LDLCALC 76 12/24/2014   stable overall by history and exam, recent data reviewed with pt, and pt to continue medical treatment as before,  to f/u any worsening symptoms or concerns

## 2016-01-04 NOTE — Assessment & Plan Note (Signed)
stable overall by history and exam, recent data reviewed with pt, and pt to continue medical treatment as before,  to f/u any worsening symptoms or concerns BP Readings from Last 3 Encounters:  12/31/15 124/70  06/25/15 109/61  06/18/15 (!) 147/55

## 2016-05-27 ENCOUNTER — Other Ambulatory Visit: Payer: Self-pay | Admitting: Internal Medicine

## 2016-06-18 ENCOUNTER — Telehealth: Payer: Self-pay | Admitting: Internal Medicine

## 2016-06-18 NOTE — Telephone Encounter (Signed)
Attempted to call patient to schedule awv. Patient did not answer. Will attempt to call patient at a later time.

## 2016-06-26 ENCOUNTER — Other Ambulatory Visit: Payer: Self-pay | Admitting: Internal Medicine

## 2016-06-29 ENCOUNTER — Encounter: Payer: Self-pay | Admitting: Internal Medicine

## 2016-06-29 ENCOUNTER — Ambulatory Visit (INDEPENDENT_AMBULATORY_CARE_PROVIDER_SITE_OTHER): Payer: Medicare Other | Admitting: Internal Medicine

## 2016-06-29 ENCOUNTER — Other Ambulatory Visit (INDEPENDENT_AMBULATORY_CARE_PROVIDER_SITE_OTHER): Payer: Medicare Other

## 2016-06-29 VITALS — BP 142/80 | HR 76 | Temp 97.5°F | Ht 68.0 in | Wt 147.0 lb

## 2016-06-29 DIAGNOSIS — R739 Hyperglycemia, unspecified: Secondary | ICD-10-CM

## 2016-06-29 DIAGNOSIS — I10 Essential (primary) hypertension: Secondary | ICD-10-CM

## 2016-06-29 DIAGNOSIS — E785 Hyperlipidemia, unspecified: Secondary | ICD-10-CM

## 2016-06-29 LAB — URINALYSIS, ROUTINE W REFLEX MICROSCOPIC
Bilirubin Urine: NEGATIVE
Hgb urine dipstick: NEGATIVE
Ketones, ur: NEGATIVE
LEUKOCYTES UA: NEGATIVE
NITRITE: NEGATIVE
PH: 5.5 (ref 5.0–8.0)
RBC / HPF: NONE SEEN (ref 0–?)
Specific Gravity, Urine: >=1.03 — AB (ref 1.000–1.030)
Total Protein, Urine: NEGATIVE
UROBILINOGEN UA: 0.2 (ref 0.0–1.0)
Urine Glucose: NEGATIVE

## 2016-06-29 LAB — HEPATIC FUNCTION PANEL
ALBUMIN: 4.5 g/dL (ref 3.5–5.2)
ALT: 22 U/L (ref 0–53)
AST: 25 U/L (ref 0–37)
Alkaline Phosphatase: 100 U/L (ref 39–117)
Bilirubin, Direct: 0.1 mg/dL (ref 0.0–0.3)
TOTAL PROTEIN: 6.9 g/dL (ref 6.0–8.3)
Total Bilirubin: 0.5 mg/dL (ref 0.2–1.2)

## 2016-06-29 LAB — LIPID PANEL
CHOLESTEROL: 135 mg/dL (ref 0–200)
HDL: 33.5 mg/dL — ABNORMAL LOW (ref >39.00)
LDL Cholesterol: 71 mg/dL (ref 0–99)
NonHDL: 101.02
TRIGLYCERIDES: 150 mg/dL — AB (ref 0.0–149.0)
Total CHOL/HDL Ratio: 4
VLDL: 30 mg/dL (ref 0.0–40.0)

## 2016-06-29 LAB — HEMOGLOBIN A1C: Hgb A1c MFr Bld: 5.6 % (ref 4.6–6.5)

## 2016-06-29 LAB — CBC WITH DIFFERENTIAL/PLATELET
Basophils Absolute: 0.2 10*3/uL — ABNORMAL HIGH (ref 0.0–0.1)
Basophils Relative: 1.3 % (ref 0.0–3.0)
EOS ABS: 0.4 10*3/uL (ref 0.0–0.7)
Eosinophils Relative: 3.5 % (ref 0.0–5.0)
HCT: 41.3 % (ref 39.0–52.0)
HEMOGLOBIN: 13.6 g/dL (ref 13.0–17.0)
Lymphocytes Relative: 6.2 % — ABNORMAL LOW (ref 12.0–46.0)
Lymphs Abs: 0.8 10*3/uL (ref 0.7–4.0)
MCHC: 32.9 g/dL (ref 30.0–36.0)
MCV: 95 fl (ref 78.0–100.0)
MONO ABS: 0.3 10*3/uL (ref 0.1–1.0)
Monocytes Relative: 2.5 % — ABNORMAL LOW (ref 3.0–12.0)
Neutro Abs: 10.9 10*3/uL — ABNORMAL HIGH (ref 1.4–7.7)
Neutrophils Relative %: 86.5 % — ABNORMAL HIGH (ref 43.0–77.0)
Platelets: 344 10*3/uL (ref 150.0–400.0)
RBC: 4.35 Mil/uL (ref 4.22–5.81)
RDW: 16.2 % — AB (ref 11.5–15.5)

## 2016-06-29 LAB — BASIC METABOLIC PANEL
BUN: 29 mg/dL — AB (ref 6–23)
CHLORIDE: 103 meq/L (ref 96–112)
CO2: 29 mEq/L (ref 19–32)
Calcium: 9.7 mg/dL (ref 8.4–10.5)
Creatinine, Ser: 0.8 mg/dL (ref 0.40–1.50)
GFR: 97.18 mL/min (ref >60.00)
GLUCOSE: 112 mg/dL — AB (ref 70–99)
POTASSIUM: 4.9 meq/L (ref 3.5–5.1)
Sodium: 138 mEq/L (ref 135–145)

## 2016-06-29 LAB — TSH: TSH: 2.13 u[IU]/mL (ref 0.35–4.50)

## 2016-06-29 MED ORDER — ASPIRIN EC 81 MG PO TBEC: 81.0000 mg | DELAYED_RELEASE_TABLET | Freq: Every day | ORAL | 11 refills | Status: DC

## 2016-06-29 MED ORDER — AMLODIPINE BESYLATE 2.5 MG PO TABS
2.5000 mg | ORAL_TABLET | Freq: Every day | ORAL | 3 refills | Status: DC
Start: 2016-06-29 — End: 2017-01-04

## 2016-06-29 MED ORDER — TAMSULOSIN HCL 0.4 MG PO CAPS: 0.4000 mg | ORAL_CAPSULE | Freq: Every day | ORAL | 1 refills | Status: DC

## 2016-06-29 NOTE — Patient Instructions (Addendum)
Please take the Aspirin 81 mg  - 1 per day  Please continue all other medications as before, and refills have been done if requested.  Please have the pharmacy call with any other refills you may need.  Please continue your efforts at being more active, low cholesterol diet, and weight control.  You are otherwise up to date with prevention measures today.  Please keep your appointments with your specialists as you may have planned  Please go to the LAB in the Basement (turn left off the elevator) for the tests to be done today  You will be contacted by phone if any changes need to be made immediately.  Otherwise, you will receive a letter about your results with an explanation, but please check with MyChart first.  Please remember to sign up for MyChart if you have not done so, as this will be important to you in the future with finding out test results, communicating by private email, and scheduling acute appointments online when needed.  Please return in 6 months, or sooner if needed 

## 2016-06-29 NOTE — Progress Notes (Signed)
Subjective:    Patient ID: Jason Lane, male    DOB: May 11, 1927, 83 y.o.   MRN: [id number]  HPI  Here for yearly f/u; "I'm still alive!"  Overall doing ok;  Pt denies Chest pain, worsening SOB, DOE, wheezing, orthopnea, PND, worsening LE edema, palpitations, dizziness or syncope.  Pt denies neurological change such as new headache, facial or extremity weakness.  Pt denies polydipsia, polyuria, or low sugar symptoms. Pt states overall good compliance with treatment and medications, good tolerability, and has been trying to follow appropriate diet.  Pt denies worsening depressive symptoms, suicidal ideation or panic. No fever, night sweats, wt loss, loss of appetite, or other constitutional symptoms.  Pt states good ability with ADL's, has low fall risk, home safety reviewed and adequate, no other significant changes in hearing or vision, and only occasionally active with exercise.  Ran out ot BP meds, usually BP < 140/90 at home.  Only takes asa 81 prn Past Medical History:  Diagnosis Date  . ANEMIA-IRON DEFICIENCY 07/08/2010  . ASTHMA 12/03/2009   States no history to asthma  . BENIGN PROSTATIC HYPERTROPHY 12/03/2009  . Cancer ([place])    skin cancer  . DIVERTICULOSIS, COLON 12/03/2009  . FATIGUE 12/03/2009  . GERD 12/03/2009  . HYPERLIPIDEMIA 12/03/2009  . HYPERTENSION 02/01/2010  . HYPERTENSION NEC 12/03/2009  . MALIGNANT MELANOMA, HX OF 12/03/2009  . SKIN LESION 12/03/2009  . TOBACCO USE, QUIT 12/03/2009  . TREMOR 12/03/2009   Past Surgical History:  Procedure Laterality Date  . COLONOSCOPY    . HERNIA REPAIR    . INGUINAL HERNIA REPAIR Left 06/24/2015   Procedure: LAPAROSCOPIC REPAIR RECURRENT LEFT INGUINAL HERNIA;  Surgeon: Greer Pickerel, MD;  Location: [facility];  Service: General;  Laterality: Left;  . inguinal herniorrhapy  1970   right  . INSERTION OF MESH Left 06/24/2015   Procedure: INSERTION OF MESH;  Surgeon: Greer Pickerel, MD;  Location: [facility];  Service: General;  Laterality: Left;  . s/p left leg  melanoma      reports that he has quit smoking. He has never used smokeless tobacco. He reports that he drinks about 4.2 oz of alcohol per week . He reports that he does not use drugs. family history includes Colon cancer in his father. No Known Allergies Current Outpatient Prescriptions on File Prior to Visit  Medication Sig Dispense Refill  . B Complex Vitamins (B COMPLEX 100 PO) Take by mouth daily.      . cholecalciferol (VITAMIN D-400) 400 UNITS TABS Take 400 Units by mouth daily.     . Cinnamon 500 MG capsule Take 500 mg by mouth daily.      [name] co-enzyme Q-10 30 MG capsule Take 30 mg by mouth 3 (three) times daily.    . Multiple Vitamin (MULTIVITAMIN) capsule Take 1 capsule by mouth daily.       No current facility-administered medications on file prior to visit.     Review of Systems Constitutional: Negative for increased diaphoresis, or other activity, appetite or siginficant weight change other than noted HENT: Negative for worsening hearing loss, ear pain, facial swelling, mouth sores and neck stiffness.   Eyes: Negative for other worsening pain, redness or visual disturbance.  Respiratory: Negative for choking or stridor Cardiovascular: Negative for other chest pain and palpitations.  Gastrointestinal: Negative for worsening diarrhea, blood in stool, or abdominal distention Genitourinary: Negative for hematuria, flank pain or change in urine volume.  Musculoskeletal: Negative for myalgias or other joint complaints.  Skin: Negative for other color change and wound or drainage.  Neurological: Negative for syncope and numbness. other than noted Hematological: Negative for adenopathy. or other swelling Psychiatric/Behavioral: Negative for hallucinations, SI, self-injury, decreased concentration or other worsening agitation.  All other system neg per pt    Objective:   Physical Exam BP (!) 142/80   Pulse 76   Temp 97.5 F (36.4 C)   Ht 5\' 8"  (1.727 m)   Wt 147 lb (66.7 kg)    SpO2 98%   BMI 22.35 kg/m  VS noted, elderly somewhat frail Constitutional: Pt is oriented to person, place, and time. Appears well-developed and well-nourished, in no significant distress Head: Normocephalic and atraumatic  Eyes: Conjunctivae and EOM are normal. Pupils are equal, round, and reactive to light Right Ear: External ear normal.  Left Ear: External ear normal Nose: Nose normal.  Mouth/Throat: Oropharynx is clear and moist  Neck: Normal range of motion. Neck supple. No JVD present. No tracheal deviation present or significant neck LA or mass Cardiovascular: Normal rate, regular rhythm, normal heart sounds and intact distal pulses.   Pulmonary/Chest: Effort normal and breath sounds without rales or wheezing  Abdominal: Soft. Bowel sounds are normal. NT. No HSM  Musculoskeletal: Normal range of motion. Exhibits no edema Lymphadenopathy: Has no cervical adenopathy.  Neurological: Pt is alert and oriented to person, place, and time. Pt has normal reflexes. No cranial nerve deficit. Motor grossly intact Skin: Skin is warm and dry. No rash noted or new ulcers Psychiatric:  Has normal mood and affect. Behavior is normal.  No other new exam findings  Lab Results  Component Value Date   WBC 9.8 06/25/2015   HGB 13.5 06/25/2015   HCT 40.3 06/25/2015   PLT 367 06/25/2015   GLUCOSE 108 (H) 06/25/2015   CHOL 126 12/24/2014   TRIG 98.0 12/24/2014   HDL 31.30 (L) 12/24/2014   LDLCALC 76 12/24/2014   ALT 25 06/18/2015   AST 30 06/18/2015   NA 137 06/25/2015   K 4.4 06/25/2015   CL 103 06/25/2015   CREATININE 0.81 06/25/2015   BUN 10 06/25/2015   CO2 27 06/25/2015   TSH 2.47 12/24/2014   PSA 3.85 12/15/2011   INR 1.2 (H) 12/15/2011       Assessment & Plan:

## 2016-06-30 NOTE — Assessment & Plan Note (Signed)
Lab Results  Component Value Date   HGBA1C 5.6 06/29/2016   stable overall by history and exam, recent data reviewed with pt, and pt to continue medical treatment as before,  to f/u any worsening symptoms or concerns

## 2016-06-30 NOTE — Assessment & Plan Note (Signed)
stable overall by history and exam, recent data reviewed with pt, and pt to continue medical treatment as before,  to f/u any worsening symptoms or concerns BP Readings from Last 3 Encounters:  06/29/16 (!) 142/80  12/31/15 124/70  06/25/15 109/61

## 2016-06-30 NOTE — Assessment & Plan Note (Signed)
stable overall by history and exam, recent data reviewed with pt, and pt to continue medical treatment as before,  to f/u any worsening symptoms or concerns Lab Results  Component Value Date   LDLCALC 71 06/29/2016   

## 2016-07-29 ENCOUNTER — Other Ambulatory Visit: Payer: Self-pay | Admitting: Internal Medicine

## 2016-08-03 DIAGNOSIS — N3000 Acute cystitis without hematuria: Secondary | ICD-10-CM | POA: Diagnosis not present

## 2016-08-03 DIAGNOSIS — N453 Epididymo-orchitis: Secondary | ICD-10-CM | POA: Diagnosis not present

## 2016-10-22 ENCOUNTER — Other Ambulatory Visit: Payer: Self-pay | Admitting: Internal Medicine

## 2016-11-30 ENCOUNTER — Encounter: Payer: Self-pay | Admitting: Family Medicine

## 2016-11-30 ENCOUNTER — Ambulatory Visit (INDEPENDENT_AMBULATORY_CARE_PROVIDER_SITE_OTHER): Payer: Medicare Other | Admitting: Family Medicine

## 2016-11-30 ENCOUNTER — Other Ambulatory Visit: Payer: Self-pay

## 2016-11-30 ENCOUNTER — Ambulatory Visit: Payer: Self-pay

## 2016-11-30 VITALS — BP 122/64 | HR 54 | Ht 67.0 in | Wt 145.0 lb

## 2016-11-30 DIAGNOSIS — G8929 Other chronic pain: Secondary | ICD-10-CM | POA: Diagnosis not present

## 2016-11-30 DIAGNOSIS — M25511 Pain in right shoulder: Secondary | ICD-10-CM | POA: Diagnosis not present

## 2016-11-30 DIAGNOSIS — M25512 Pain in left shoulder: Principal | ICD-10-CM

## 2016-11-30 DIAGNOSIS — M12811 Other specific arthropathies, not elsewhere classified, right shoulder: Secondary | ICD-10-CM | POA: Diagnosis not present

## 2016-11-30 DIAGNOSIS — M12812 Other specific arthropathies, not elsewhere classified, left shoulder: Secondary | ICD-10-CM

## 2016-11-30 DIAGNOSIS — M75102 Unspecified rotator cuff tear or rupture of left shoulder, not specified as traumatic: Secondary | ICD-10-CM

## 2016-11-30 DIAGNOSIS — M75101 Unspecified rotator cuff tear or rupture of right shoulder, not specified as traumatic: Secondary | ICD-10-CM | POA: Insufficient documentation

## 2016-11-30 MED ORDER — GABAPENTIN 100 MG PO CAPS: 100.0000 mg | ORAL_CAPSULE | Freq: Every day | ORAL | 3 refills | Status: DC

## 2016-11-30 NOTE — Progress Notes (Signed)
[name] [address], [place] [address] Phone: [phone] Subjective:    I'm seeing this patient by the request  of:    CC: Bilateral shoulder pain  [id number]  Jason Lane is a 83 y.o. male coming in with complaint of bilateral shoulder pain. Patient has had this pain for multiple months but seems to be worsening. Patient has been working out and did add weights recently. Since then seems to be worsening. Range of motion with mild crepitus. States that it causes pain even with the elbow popping. Can wake him up at night. Denies any radiation significant laying down the arm but mild. Mild neck pain associated with it. Mild weakness he would state 2. Rates the severity of pain is more of 4 out of 10. Just concerned because he feels like he is going in the wrong direction.      Past Medical History:  Diagnosis Date  . ANEMIA-IRON DEFICIENCY 07/08/2010  . ASTHMA 12/03/2009   States no history to asthma  . BENIGN PROSTATIC HYPERTROPHY 12/03/2009  . Cancer ([place])    skin cancer  . DIVERTICULOSIS, COLON 12/03/2009  . FATIGUE 12/03/2009  . GERD 12/03/2009  . HYPERLIPIDEMIA 12/03/2009  . HYPERTENSION 02/01/2010  . HYPERTENSION NEC 12/03/2009  . MALIGNANT MELANOMA, HX OF 12/03/2009  . SKIN LESION 12/03/2009  . TOBACCO USE, QUIT 12/03/2009  . TREMOR 12/03/2009   Past Surgical History:  Procedure Laterality Date  . COLONOSCOPY    . HERNIA REPAIR    . INGUINAL HERNIA REPAIR Left 06/24/2015   Procedure: LAPAROSCOPIC REPAIR RECURRENT LEFT INGUINAL HERNIA;  Surgeon: Greer Pickerel, MD;  Location: [facility];  Service: General;  Laterality: Left;  . inguinal herniorrhapy  1970   right  . INSERTION OF MESH Left 06/24/2015   Procedure: INSERTION OF MESH;  Surgeon: Greer Pickerel, MD;  Location: [facility];  Service: General;  Laterality: Left;  . s/p left leg melanoma     Social History   Social History  . Marital status: Single    Spouse name: N/A  . Number of children: N/A    . Years of education: N/A   Occupational History  . 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 Smoking status: Former Research scientist (life sciences)  . Smokeless tobacco: Never Used     Comment: none since approx 1985  . Alcohol use 4.2 oz/week    7 Glasses of wine per week     Comment: 1 shot per day  . Drug use: No  . Sexual activity: Not Asked   Other Topics Concern  . None   Social History Narrative   Just moved to [place]   Originally from [place] in [place] in [employer] in [place] with gardening/has a living will   No children/never married   No Known Allergies Family History  Problem Relation Age of Onset  . Colon cancer Father      Past medical history, social, surgical and family history all reviewed in electronic medical record.  No pertanent information unless stated regarding to the chief complaint.   Review of Systems:Review of systems updated and as accurate as of 11/30/16  No headache, visual changes, nausea, vomiting, diarrhea, constipation, dizziness, abdominal pain, skin rash, fevers, chills, night sweats, weight loss, swollen lymph nodes, body aches, joint swelling, chest pain, shortness of breath, mood changes. Positive muscle aches  Objective  Blood pressure 122/64, pulse (!) 54, height  5\' 7"  (1.702 m), weight 145 lb (65.8 kg), SpO2 98 %. Systems examined below as of 11/30/16   General: No apparent distress alert and oriented x3 mood and affect normal, dressed appropriately.  HEENT: Pupils equal, extraocular movements intact  Respiratory: Patient's speak in full sentences and does not appear short of breath  Cardiovascular: No lower extremity edema, non tender, no erythema  Skin: Warm dry intact with no signs of infection or rash on extremities or on axial skeleton.  Abdomen: Soft nontender  Neuro: Cranial nerves II through XII are intact, neurovascularly intact in all extremities with 2+ DTRs and 2+ pulses.  Lymph: No lymphadenopathy of  posterior or anterior cervical chain or axillae bilaterally.  Gait normal with good balance and coordination.  MSK:  Non tender with full range of motion and good stability and symmetric strength and tone of , elbows, wrist, hip, knee and ankles bilaterally.  Severe arthritic changes of multiple joints Shoulder: Bilateral  Atrophy noted with significant arthritic changes of the acromioclavicular joint.. Diffuse discomfort bilaterally ROM is full in all planes passively but does have significant crepitus Rotator cuff strength 4-5 and symmetric Positive impingement Positive painful arc  MSK US performed of: Bilateral shoulder This study was ordered, performed, and interpreted by Charlann Boxer D.O.  Shoulder:   Patient is severe underlying osteophytic changes of the glenohumeral in the acromioclavicular joint. Patient's rotator cuff seems to be intact but has significant atrophy. Impression: Severe rotator cuff arthropathy      Impression and Recommendations:     This case required medical decision making of moderate complexity.      Note: This dictation was prepared with Dragon dictation along with smaller phrase technology. Any transcriptional errors that result from this process are unintentional.

## 2016-11-30 NOTE — Patient Instructions (Signed)
Good to see you.  Ice 20 minutes 2 times daily. Usually after activity and before bed. Exercises 3 times a week.  pennsaid pinkie amount topically 2 times daily as needed.  One prescription gabapentin 100mg  at night  Over the counter get  Vitamin D 2000 IU daily  Tart cherry extract any dose at night  Stay active but keep hands within peripheral vison  See me again in 4 weeks.

## 2016-11-30 NOTE — Assessment & Plan Note (Signed)
Severe arthritis of the shoulders bilaterally. Encourage patient to increase vitamin D for muscle strength and endurance. We discussed avoiding certain activities such as heavy lifting or over the head activities. We discussed topical anti-inflammatories and given trial. We discussed icing regimen. Patient will try these different treatment options and will return in 4 weeks for further evaluation.

## 2016-12-09 ENCOUNTER — Other Ambulatory Visit: Payer: Self-pay | Admitting: *Deleted

## 2016-12-09 MED ORDER — GABAPENTIN 100 MG PO CAPS: 100.0000 mg | ORAL_CAPSULE | Freq: Every day | ORAL | 1 refills | Status: DC

## 2016-12-09 NOTE — Telephone Encounter (Signed)
Refill done.  

## 2016-12-20 DIAGNOSIS — Z85828 Personal history of other malignant neoplasm of skin: Secondary | ICD-10-CM | POA: Diagnosis not present

## 2016-12-20 DIAGNOSIS — D485 Neoplasm of uncertain behavior of skin: Secondary | ICD-10-CM | POA: Diagnosis not present

## 2016-12-20 DIAGNOSIS — L821 Other seborrheic keratosis: Secondary | ICD-10-CM | POA: Diagnosis not present

## 2016-12-20 DIAGNOSIS — L814 Other melanin hyperpigmentation: Secondary | ICD-10-CM | POA: Diagnosis not present

## 2016-12-20 DIAGNOSIS — L820 Inflamed seborrheic keratosis: Secondary | ICD-10-CM | POA: Diagnosis not present

## 2016-12-28 ENCOUNTER — Telehealth: Payer: Self-pay | Admitting: Internal Medicine

## 2016-12-28 ENCOUNTER — Ambulatory Visit (INDEPENDENT_AMBULATORY_CARE_PROVIDER_SITE_OTHER): Payer: Medicare Other | Admitting: Family Medicine

## 2016-12-28 ENCOUNTER — Encounter: Payer: Self-pay | Admitting: Family Medicine

## 2016-12-28 DIAGNOSIS — M75102 Unspecified rotator cuff tear or rupture of left shoulder, not specified as traumatic: Principal | ICD-10-CM

## 2016-12-28 DIAGNOSIS — M12812 Other specific arthropathies, not elsewhere classified, left shoulder: Secondary | ICD-10-CM

## 2016-12-28 DIAGNOSIS — M75101 Unspecified rotator cuff tear or rupture of right shoulder, not specified as traumatic: Principal | ICD-10-CM

## 2016-12-28 DIAGNOSIS — M12811 Other specific arthropathies, not elsewhere classified, right shoulder: Secondary | ICD-10-CM

## 2016-12-28 NOTE — Progress Notes (Signed)
[name] [address], [place] [address] Phone: [phone] Subjective:    I'm seeing this patient by the request  of:    CC: Bilateral shoulder pain f/u  [id number]  Jason Lane is a 83 y.o. male coming in with complaint of bilateral shoulder pain. Patient was seen previously and was found to have arthritic changes. Patient elected try conservative therapy including topical anti-inflammatories, icing regimen, and over-the-counter natural supplementations. Patient was also changed some of his daily activities. Has made significant improvement. Patient states that he is feeling 80-90% better. Very minimal pain unless he moves too quickly. Happy with the results.      Past Medical History:  Diagnosis Date  . ANEMIA-IRON DEFICIENCY 07/08/2010  . ASTHMA 12/03/2009   States no history to asthma  . BENIGN PROSTATIC HYPERTROPHY 12/03/2009  . Cancer ([place])    skin cancer  . DIVERTICULOSIS, COLON 12/03/2009  . FATIGUE 12/03/2009  . GERD 12/03/2009  . HYPERLIPIDEMIA 12/03/2009  . HYPERTENSION 02/01/2010  . HYPERTENSION NEC 12/03/2009  . MALIGNANT MELANOMA, HX OF 12/03/2009  . SKIN LESION 12/03/2009  . TOBACCO USE, QUIT 12/03/2009  . TREMOR 12/03/2009   Past Surgical History:  Procedure Laterality Date  . COLONOSCOPY    . HERNIA REPAIR    . INGUINAL HERNIA REPAIR Left 06/24/2015   Procedure: LAPAROSCOPIC REPAIR RECURRENT LEFT INGUINAL HERNIA;  Surgeon: Greer Pickerel, MD;  Location: [facility];  Service: General;  Laterality: Left;  . inguinal herniorrhapy  1970   right  . INSERTION OF MESH Left 06/24/2015   Procedure: INSERTION OF MESH;  Surgeon: Greer Pickerel, MD;  Location: [facility];  Service: General;  Laterality: Left;  . s/p left leg melanoma     Social History   Social History  . Marital status: Single    Spouse name: N/A  . Number of children: N/A  . Years of education: N/A   Occupational History  . 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 Smoking status: Former Research scientist (life sciences)  . Smokeless tobacco: Never Used     Comment: none since approx 1985  . Alcohol use 4.2 oz/week    7 Glasses of wine per week     Comment: 1 shot per day  . Drug use: No  . Sexual activity: Not Asked   Other Topics Concern  . None   Social History Narrative   Just moved to [place]   Originally from [place] in [place] in [employer] in [place] with gardening/has a living will   No children/never married   No Known Allergies Family History  Problem Relation Age of Onset  . Colon cancer Father      Past medical history, social, surgical and family history all reviewed in electronic medical record.  No pertanent information unless stated regarding to the chief complaint.   Review of Systems: No headache, visual changes, nausea, vomiting, diarrhea, constipation, dizziness, abdominal pain, skin rash, fevers, chills, night sweats, weight loss, swollen lymph nodes, body aches, joint swelling, muscle aches, chest pain, shortness of breath, mood changes.    Objective  Blood pressure (!) 150/70, pulse (!) 54.   Systems examined below as of 12/28/16 General: NAD A&O x3 mood, affect normal  HEENT: Pupils equal, extraocular movements intact no nystagmus Respiratory: not short of breath at rest or with speaking Cardiovascular: No lower extremity edema, non tender Skin: Warm dry intact with no signs  of infection or rash on extremities or on axial skeleton. Abdomen: Soft nontender, no masses Neuro: Cranial nerves  intact, neurovascularly intact in all extremities with 2+ DTRs and 2+ pulses. Lymph: No lymphadenopathy appreciated today  Gait normal with good balance and coordination.  MSK: Non tender with full range of motion and good stability and symmetric strength and tone of shoulders, elbows, wrist,  knee hips and ankles bilaterally.   MSK:  Non tender with full range of motion and good stability and symmetric strength and tone of , elbows,  wrist, hip, knee and ankles bilaterally.  Severe arthritic changes of multiple joints Shoulder: Bilateral  Atrophy noted with significant arthritic changes of the acromioclavicular joint.. Minimal discomfort to palpation today which is an improvement ROM is full in all planes passively but does have significant crepitus still noted. Patient does have near full range of motion actively now as well. Rotator cuff strength 4-5 and symmetric Positive impingement still noted. Neurovascular intact distally      Impression and Recommendations:     This case required medical decision making of moderate complexity.      Note: This dictation was prepared with Dragon dictation along with smaller phrase technology. Any transcriptional errors that result from this process are unintentional.

## 2016-12-28 NOTE — Assessment & Plan Note (Signed)
Patient is still significant healing well with conservative therapy. We discussed icing regimen and home exercises still. Gabapentin still at night as needed. Follow-up with me as needed

## 2016-12-28 NOTE — Patient Instructions (Signed)
Good to see you  Jason Lane is your friend.  Use the gabapentin at night as needed Continue the exercises 1-3 times a week.  Continue the vitamins See me when you need me.

## 2016-12-28 NOTE — Telephone Encounter (Signed)
Rec'd from [facility] forwarded 2 pages to Cathlean Cower MD

## 2017-01-04 ENCOUNTER — Ambulatory Visit (INDEPENDENT_AMBULATORY_CARE_PROVIDER_SITE_OTHER): Payer: Medicare Other | Admitting: Internal Medicine

## 2017-01-04 ENCOUNTER — Other Ambulatory Visit (INDEPENDENT_AMBULATORY_CARE_PROVIDER_SITE_OTHER): Payer: Medicare Other

## 2017-01-04 VITALS — BP 118/74 | HR 60 | Temp 98.2°F | Ht 67.0 in | Wt 144.0 lb

## 2017-01-04 DIAGNOSIS — E785 Hyperlipidemia, unspecified: Secondary | ICD-10-CM | POA: Diagnosis not present

## 2017-01-04 DIAGNOSIS — Z23 Encounter for immunization: Secondary | ICD-10-CM | POA: Diagnosis not present

## 2017-01-04 DIAGNOSIS — R739 Hyperglycemia, unspecified: Secondary | ICD-10-CM

## 2017-01-04 DIAGNOSIS — I10 Essential (primary) hypertension: Secondary | ICD-10-CM

## 2017-01-04 LAB — CBC WITH DIFFERENTIAL/PLATELET
BASOS ABS: 0.2 10*3/uL — AB (ref 0.0–0.1)
BASOS PCT: 1.4 % (ref 0.0–3.0)
EOS ABS: 0.4 10*3/uL (ref 0.0–0.7)
Eosinophils Relative: 3.1 % (ref 0.0–5.0)
HEMATOCRIT: 40.2 % (ref 39.0–52.0)
Hemoglobin: 13.1 g/dL (ref 13.0–17.0)
Lymphs Abs: 0.8 10*3/uL (ref 0.7–4.0)
MCHC: 32.7 g/dL (ref 30.0–36.0)
MCV: 93.1 fl (ref 78.0–100.0)
MONO ABS: 0.4 10*3/uL (ref 0.1–1.0)
Monocytes Relative: 2.8 % — ABNORMAL LOW (ref 3.0–12.0)
NEUTROS ABS: 11.2 10*3/uL — AB (ref 1.4–7.7)
PLATELETS: 409 10*3/uL — AB (ref 150.0–400.0)
RBC: 4.32 Mil/uL (ref 4.22–5.81)
RDW: 18.3 % — AB (ref 11.5–15.5)
WBC: 12.9 10*3/uL — ABNORMAL HIGH (ref 4.0–10.5)

## 2017-01-04 LAB — HEPATIC FUNCTION PANEL
ALT: 16 U/L (ref 0–53)
AST: 28 U/L (ref 0–37)
Albumin: 4.4 g/dL (ref 3.5–5.2)
Alkaline Phosphatase: 86 U/L (ref 39–117)
BILIRUBIN DIRECT: 0.1 mg/dL (ref 0.0–0.3)
TOTAL PROTEIN: 6.6 g/dL (ref 6.0–8.3)
Total Bilirubin: 0.7 mg/dL (ref 0.2–1.2)

## 2017-01-04 LAB — BASIC METABOLIC PANEL
BUN: 30 mg/dL — ABNORMAL HIGH (ref 6–23)
CO2: 26 meq/L (ref 19–32)
CREATININE: 0.94 mg/dL (ref 0.40–1.50)
Calcium: 9.6 mg/dL (ref 8.4–10.5)
Chloride: 105 mEq/L (ref 96–112)
GFR: 80.58 mL/min (ref >60.00)
Glucose, Bld: 104 mg/dL — ABNORMAL HIGH (ref 70–99)
Potassium: 5.4 mEq/L — ABNORMAL HIGH (ref 3.5–5.1)
Sodium: 140 mEq/L (ref 135–145)

## 2017-01-04 LAB — LIPID PANEL
CHOL/HDL RATIO: 4
Cholesterol: 121 mg/dL (ref 0–200)
HDL: 29.7 mg/dL — AB (ref >39.00)
LDL CALC: 66 mg/dL (ref 0–99)
NONHDL: 91.18
Triglycerides: 128 mg/dL (ref 0.0–149.0)
VLDL: 25.6 mg/dL (ref 0.0–40.0)

## 2017-01-04 LAB — HEMOGLOBIN A1C: Hgb A1c MFr Bld: 5.6 % (ref 4.6–6.5)

## 2017-01-04 MED ORDER — AMLODIPINE BESYLATE 2.5 MG PO TABS: 2.5000 mg | ORAL_TABLET | Freq: Every day | ORAL | 3 refills | Status: DC

## 2017-01-04 NOTE — Assessment & Plan Note (Signed)
Possible overcontrolled, ok to d/c the amlodipine and cont to montior for worsening dizziness

## 2017-01-04 NOTE — Patient Instructions (Addendum)
You had the flu shot today  OK to stop the amlodipine (norvasc) - the blood pressure pill  Please continue all other medications as before, and refills have been done if requested.  Please have the pharmacy call with any other refills you may need.  Please continue your efforts at being more active, low cholesterol diet, and weight control.  Please keep your appointments with your specialists as you may have planned  Please go to the LAB in the Basement (turn left off the elevator) for the tests to be done today  You will be contacted by phone if any changes need to be made immediately.  Otherwise, you will receive a letter about your results with an explanation, but please check with MyChart first.  Please remember to sign up for MyChart if you have not done so, as this will be important to you in the future with finding out test results, communicating by private email, and scheduling acute appointments online when needed.  Please return in 6 months, or sooner if needed

## 2017-01-04 NOTE — Assessment & Plan Note (Signed)
Asympt, for a1c f/u today,  to f/u any worsening symptoms or concerns

## 2017-01-04 NOTE — Assessment & Plan Note (Signed)
stable overall by history and exam, recent data reviewed with pt, and pt to continue medical treatment as before,  to f/u any worsening symptoms or concerns Lab Results  Component Value Date   LDLCALC 71 06/29/2016

## 2017-01-04 NOTE — Progress Notes (Signed)
Subjective:    Patient ID: Jason Lane, male    DOB: 09/01/1927, 83 y.o.   MRN: [id number]  HPI  Here to f/u; overall doing ok,  Pt denies chest pain, increasing sob or doe, wheezing, orthopnea, PND, increased LE swelling, palpitations, or syncope.  Pt denies new neurological symptoms such as new headache, or facial or extremity weakness or numbness.  Pt denies polydipsia, polyuria, or low sugar episode.  Pt states overall good compliance with meds, mostly trying to follow appropriate diet, with wt overall down only 3 lbs.  Has had less energy for exercise recently.  No falls though has been dizzy occasionally and small off balance so that he holds onto furniture to put on pants.  Has bilat hand tremors mild worse, also mentions arthritic pain ongoing, including the shoulder for which he has seen Dr Smith/sport med Wt Readings from Last 3 Encounters:  01/04/17 144 lb (65.3 kg)  11/30/16 145 lb (65.8 kg)  06/29/16 147 lb (66.7 kg)   BP Readings from Last 3 Encounters:  01/04/17 118/74  12/28/16 (!) 150/70  11/30/16 122/64   Past Medical History:  Diagnosis Date  . ANEMIA-IRON DEFICIENCY 07/08/2010  . ASTHMA 12/03/2009   States no history to asthma  . BENIGN PROSTATIC HYPERTROPHY 12/03/2009  . Cancer ([place])    skin cancer  . DIVERTICULOSIS, COLON 12/03/2009  . FATIGUE 12/03/2009  . GERD 12/03/2009  . HYPERLIPIDEMIA 12/03/2009  . HYPERTENSION 02/01/2010  . HYPERTENSION NEC 12/03/2009  . MALIGNANT MELANOMA, HX OF 12/03/2009  . SKIN LESION 12/03/2009  . TOBACCO USE, QUIT 12/03/2009  . TREMOR 12/03/2009   Past Surgical History:  Procedure Laterality Date  . COLONOSCOPY    . HERNIA REPAIR    . INGUINAL HERNIA REPAIR Left 06/24/2015   Procedure: LAPAROSCOPIC REPAIR RECURRENT LEFT INGUINAL HERNIA;  Surgeon: Greer Pickerel, MD;  Location: [facility];  Service: General;  Laterality: Left;  . inguinal herniorrhapy  1970   right  . INSERTION OF MESH Left 06/24/2015   Procedure: INSERTION OF MESH;  Surgeon: Greer Pickerel, MD;  Location: [facility];  Service: General;  Laterality: Left;  . s/p left leg melanoma      reports that he has quit smoking. He has never used smokeless tobacco. He reports that he drinks about 4.2 oz of alcohol per week . He reports that he does not use drugs. family history includes Colon cancer in his father. No Known Allergies Current Outpatient Prescriptions on File Prior to Visit  Medication Sig Dispense Refill  . amLODipine (NORVASC) 2.5 MG tablet Take 1 tablet (2.5 mg total) by mouth daily. 90 tablet 3  . aspirin EC 81 MG tablet Take 1 tablet (81 mg total) by mouth daily. 90 tablet 11  . B Complex Vitamins (B COMPLEX 100 PO) Take by mouth daily.      . cholecalciferol (VITAMIN D-400) 400 UNITS TABS Take 400 Units by mouth daily.     . Cinnamon 500 MG capsule Take 500 mg by mouth daily.      [name] co-enzyme Q-10 30 MG capsule Take 30 mg by mouth 3 (three) times daily.    [name] gabapentin (NEURONTIN) 100 MG capsule Take 1 capsule (100 mg total) by mouth at bedtime. 90 capsule 1  . Multiple Vitamin (MULTIVITAMIN) capsule Take 1 capsule by mouth daily.      . tamsulosin (FLOMAX) 0.4 MG CAPS capsule Take 1 capsule (0.4 mg total) by mouth daily. 90 capsule 1   No current  facility-administered medications on file prior to visit.     Review of Systems  Constitutional: Negative for other unusual diaphoresis or sweats HENT: Negative for ear discharge or swelling Eyes: Negative for other worsening visual disturbances Respiratory: Negative for stridor or other swelling  Gastrointestinal: Negative for worsening distension or other blood Genitourinary: Negative for retention or other urinary change Musculoskeletal: Negative for other MSK pain or swelling Skin: Negative for color change or other new lesions Neurological: Negative for worsening tremors and other numbness  Psychiatric/Behavioral: Negative for worsening agitation or other fatigue All other system neg per pt    Objective:    Physical Exam BP 118/74   Pulse 60   Temp 98.2 F (36.8 C) (Oral)   Ht 5\' 7"  (1.702 m)   Wt 144 lb (65.3 kg)   SpO2 99%   BMI 22.55 kg/m  VS noted, jovial, good mood today Constitutional: Pt appears in NAD HENT: Head: NCAT.  Right Ear: External ear normal.  Left Ear: External ear normal.  Eyes: . Pupils are equal, round, and reactive to light. Conjunctivae and EOM are normal Nose: without d/c or deformity Neck: Neck supple. Gross normal ROM Cardiovascular: Normal rate and regular rhythm.   Pulmonary/Chest: Effort normal and breath sounds without rales or wheezing.  MSK: mult arthritic changes to hands Abd:  Soft, NT, ND, + BS, no organomegaly Neurological: Pt is alert. At baseline orientation - not demented, motor grossly intact Skin: Skin is warm. No rashes, other new lesions, no LE edema Psychiatric: Pt behavior is normal without agitation  No other exam findings Lab Results  Component Value Date   WBC 12.6 Repeated and verified X2. (H) 06/29/2016   HGB 13.6 06/29/2016   HCT 41.3 06/29/2016   PLT 344.0 06/29/2016   GLUCOSE 112 (H) 06/29/2016   CHOL 135 06/29/2016   TRIG 150.0 (H) 06/29/2016   HDL 33.50 (L) 06/29/2016   LDLCALC 71 06/29/2016   ALT 22 06/29/2016   AST 25 06/29/2016   NA 138 06/29/2016   K 4.9 06/29/2016   CL 103 06/29/2016   CREATININE 0.80 06/29/2016   BUN 29 (H) 06/29/2016   CO2 29 06/29/2016   TSH 2.13 06/29/2016   PSA 3.85 12/15/2011   INR 1.2 (H) 12/15/2011   HGBA1C 5.6 06/29/2016       Assessment & Plan:

## 2017-01-05 ENCOUNTER — Encounter: Payer: Self-pay | Admitting: Internal Medicine

## 2017-01-20 DIAGNOSIS — H52203 Unspecified astigmatism, bilateral: Secondary | ICD-10-CM | POA: Diagnosis not present

## 2017-01-20 DIAGNOSIS — H25011 Cortical age-related cataract, right eye: Secondary | ICD-10-CM | POA: Diagnosis not present

## 2017-01-20 DIAGNOSIS — H5203 Hypermetropia, bilateral: Secondary | ICD-10-CM | POA: Diagnosis not present

## 2017-01-20 DIAGNOSIS — H2511 Age-related nuclear cataract, right eye: Secondary | ICD-10-CM | POA: Diagnosis not present

## 2017-01-20 DIAGNOSIS — H5022 Vertical strabismus, left eye: Secondary | ICD-10-CM | POA: Diagnosis not present

## 2017-01-20 DIAGNOSIS — H524 Presbyopia: Secondary | ICD-10-CM | POA: Diagnosis not present

## 2017-03-30 ENCOUNTER — Encounter: Payer: Self-pay | Admitting: Internal Medicine

## 2017-03-30 ENCOUNTER — Ambulatory Visit (INDEPENDENT_AMBULATORY_CARE_PROVIDER_SITE_OTHER): Payer: Medicare Other | Admitting: Internal Medicine

## 2017-03-30 VITALS — BP 132/84 | HR 67 | Ht 67.0 in | Wt 146.0 lb

## 2017-03-30 DIAGNOSIS — I10 Essential (primary) hypertension: Secondary | ICD-10-CM | POA: Diagnosis not present

## 2017-03-30 DIAGNOSIS — K625 Hemorrhage of anus and rectum: Secondary | ICD-10-CM

## 2017-03-30 DIAGNOSIS — R739 Hyperglycemia, unspecified: Secondary | ICD-10-CM

## 2017-03-30 DIAGNOSIS — R310 Gross hematuria: Secondary | ICD-10-CM | POA: Diagnosis not present

## 2017-03-30 NOTE — Assessment & Plan Note (Signed)
stable overall by history and exam, recent data reviewed with pt, and pt to continue medical treatment as before,  to f/u any worsening symptoms or concerns Lab Results  Component Value Date   HGBA1C 5.6 01/04/2017

## 2017-03-30 NOTE — Assessment & Plan Note (Signed)
Will need urine studies to confirm, but appears to have persistent small gross hematuria daily for about 1 wk; differential includes renal stone, infection, malignancy or no source; pt states will f/u in AM for his testing, and also plans to simply walk across the street to [facility] to self refer for further evaluation that I explained would likely meant CT scan abd/pelvis and Cysto.

## 2017-03-30 NOTE — Assessment & Plan Note (Signed)
stable overall by history and exam, recent data reviewed with pt, and pt to continue medical treatment as before,  to f/u any worsening symptoms or concerns BP Readings from Last 3 Encounters:  03/30/17 132/84  01/04/17 118/74  12/28/16 (!) 150/70

## 2017-03-30 NOTE — Patient Instructions (Addendum)
Please continue all other medications as before, and refills have been done if requested.  Please have the pharmacy call with any other refills you may need.  Please continue your efforts at being more active, low cholesterol diet, and weight control.  Please keep your appointments with your specialists as you may have planned  Please go to the LAB in the Basement (turn left off the elevator) for the tests to be done tomorrow  You will be contacted by phone if any changes need to be made immediately.  Otherwise, you will receive a letter about your results with an explanation, but please check with MyChart first.  Please remember to sign up for MyChart if you have not done so, as this will be important to you in the future with finding out test results, communicating by private email, and scheduling acute appointments online when needed.  Please also go tomorrow to [facility] to make your appointment to be seen

## 2017-03-30 NOTE — Progress Notes (Signed)
Subjective:    Patient ID: Jason Lane, male    DOB: 1927-08-13, 83 y.o.   MRN: [id number]  HPI   Here to fu with 1 wk onset gross hematuria noted as red tinge to urine (normally is yellow) consistently recurrent every day, but Denies urinary symptoms such as dysuria or any other pain, frequency, urgency, flank pain, or n/v, fever, chills.  May have slowed a bit with holding on taking the asa for last 3-4 days, but still had some this am and is wondering about cause and any tx.  Also incidentally has had trace to small amounts of BRBPR on wiping only (clearly not urinary related) twice in last few days as well.  Pt denies chest pain, increased sob or doe, wheezing, orthopnea, PND, increased LE swelling, palpitations, dizziness or syncope.  No other unusual bleeding or bruising or swelling.  No prior hx of either.  Last colonscopy 2015 with diverticulosis, intermal hemorrhoid only - no malignancy, and no f/u planned due to age. Has not had recent imaging renal and does not see urology regularly.  Did incidentally have what he though might have been a erythem itchy spot to the right antecubital area that he thinks may have been a Bed bug bite, though no other lesions and has not seen bed bugs.  He did use an OTC bed bug treatment with "diatomacous earth" and wondering if somehow breathing the fumes from this home self tx may have caused his symptoms.  No anticoagulant use. Past Medical History:  Diagnosis Date  . ANEMIA-IRON DEFICIENCY 07/08/2010  . ASTHMA 12/03/2009   States no history to asthma  . BENIGN PROSTATIC HYPERTROPHY 12/03/2009  . Cancer ([place])    skin cancer  . DIVERTICULOSIS, COLON 12/03/2009  . FATIGUE 12/03/2009  . GERD 12/03/2009  . HYPERLIPIDEMIA 12/03/2009  . HYPERTENSION 02/01/2010  . HYPERTENSION NEC 12/03/2009  . MALIGNANT MELANOMA, HX OF 12/03/2009  . SKIN LESION 12/03/2009  . TOBACCO USE, QUIT 12/03/2009  . TREMOR 12/03/2009   Past Surgical History:  Procedure Laterality Date  . COLONOSCOPY     . HERNIA REPAIR    . INGUINAL HERNIA REPAIR Left 06/24/2015   Procedure: LAPAROSCOPIC REPAIR RECURRENT LEFT INGUINAL HERNIA;  Surgeon: Greer Pickerel, MD;  Location: [facility];  Service: General;  Laterality: Left;  . inguinal herniorrhapy  1970   right  . INSERTION OF MESH Left 06/24/2015   Procedure: INSERTION OF MESH;  Surgeon: Greer Pickerel, MD;  Location: [facility];  Service: General;  Laterality: Left;  . s/p left leg melanoma      reports that he has quit smoking. he has never used smokeless tobacco. He reports that he drinks about 4.2 oz of alcohol per week. He reports that he does not use drugs. family history includes Colon cancer in his father. No Known Allergies Current Outpatient Medications on File Prior to Visit  Medication Sig Dispense Refill  . aspirin EC 81 MG tablet Take 1 tablet (81 mg total) by mouth daily. 90 tablet 11  . B Complex Vitamins (B COMPLEX 100 PO) Take by mouth daily.      . cholecalciferol (VITAMIN D-400) 400 UNITS TABS Take 400 Units by mouth daily.     . Cinnamon 500 MG capsule Take 500 mg by mouth daily.      [name] co-enzyme Q-10 30 MG capsule Take 30 mg by mouth 3 (three) times daily.    [name] gabapentin (NEURONTIN) 100 MG capsule Take 1 capsule (100 mg total) by  mouth at bedtime. 90 capsule 1  . Multiple Vitamin (MULTIVITAMIN) capsule Take 1 capsule by mouth daily.      . tamsulosin (FLOMAX) 0.4 MG CAPS capsule Take 1 capsule (0.4 mg total) by mouth daily. 90 capsule 1   No current facility-administered medications on file prior to visit.    Review of Systems  Constitutional: Negative for other unusual diaphoresis or sweats HENT: Negative for ear discharge or swelling Eyes: Negative for other worsening visual disturbances Respiratory: Negative for stridor or other swelling  Gastrointestinal: Negative for worsening distension or other blood Genitourinary: Negative for retention or other urinary change Musculoskeletal: Negative for other MSK pain or swelling Skin:  Negative for color change or other new lesions Neurological: Negative for worsening tremors and other numbness  Psychiatric/Behavioral: Negative for worsening agitation or other fatigue All other system neg per pt    Objective:   Physical Exam BP 132/84   Pulse 67   Ht 5\' 7"  (1.702 m)   Wt 146 lb (66.2 kg)   SpO2 99%   BMI 22.87 kg/m  VS noted, not ill appaering Constitutional: Pt appears in NAD HENT: Head: NCAT.  Right Ear: External ear normal.  Left Ear: External ear normal.  Eyes: . Pupils are equal, round, and reactive to light. Conjunctivae and EOM are normal Nose: without d/c or deformity Neck: Neck supple. Gross normal ROM Cardiovascular: Normal rate and regular rhythm.   Pulmonary/Chest: Effort normal and breath sounds without rales or wheezing.  Abd:  Soft, NT, ND, + BS, no organomegaly, no flank tender Neurological: Pt is alert. At baseline orientation, motor grossly intact Skin: Skin is warm. No rashes, other new lesions, no LE edema Psychiatric: Pt behavior is normal without agitation  No other exam findings Lab Results  Component Value Date   WBC 12.9 (H) 01/04/2017   HGB 13.1 01/04/2017   HCT 40.2 01/04/2017   PLT 409.0 (H) 01/04/2017   GLUCOSE 104 (H) 01/04/2017   CHOL 121 01/04/2017   TRIG 128.0 01/04/2017   HDL 29.70 (L) 01/04/2017   LDLCALC 66 01/04/2017   ALT 16 01/04/2017   AST 28 01/04/2017   NA 140 01/04/2017   K 5.4 (H) 01/04/2017   CL 105 01/04/2017   CREATININE 0.94 01/04/2017   BUN 30 (H) 01/04/2017   CO2 26 01/04/2017   TSH 2.13 06/29/2016   PSA 3.85 12/15/2011   INR 1.2 (H) 12/15/2011   HGBA1C 5.6 01/04/2017      Assessment & Plan:

## 2017-03-30 NOTE — Assessment & Plan Note (Signed)
Very small volume twice in past few days on wiping only, o/w asymptomatic, most likely due to diverticulosis or internal hemorrhoid; pt will cont to monitor, for cbc in am and plans to f/u with GI with self referral if persists

## 2017-03-31 ENCOUNTER — Encounter: Payer: Self-pay | Admitting: Internal Medicine

## 2017-03-31 ENCOUNTER — Other Ambulatory Visit (INDEPENDENT_AMBULATORY_CARE_PROVIDER_SITE_OTHER): Payer: Medicare Other

## 2017-03-31 DIAGNOSIS — R310 Gross hematuria: Secondary | ICD-10-CM

## 2017-03-31 LAB — BASIC METABOLIC PANEL
BUN: 28 mg/dL — ABNORMAL HIGH (ref 6–23)
CO2: 28 mEq/L (ref 19–32)
Calcium: 9.7 mg/dL (ref 8.4–10.5)
Chloride: 104 mEq/L (ref 96–112)
Creatinine, Ser: 0.94 mg/dL (ref 0.40–1.50)
GFR: 80.54 mL/min (ref >60.00)
Glucose, Bld: 107 mg/dL — ABNORMAL HIGH (ref 70–99)
POTASSIUM: 5.2 meq/L — AB (ref 3.5–5.1)
SODIUM: 139 meq/L (ref 135–145)

## 2017-03-31 LAB — URINALYSIS, ROUTINE W REFLEX MICROSCOPIC
BILIRUBIN URINE: NEGATIVE
Hgb urine dipstick: NEGATIVE
KETONES UR: NEGATIVE
LEUKOCYTES UA: NEGATIVE
NITRITE: NEGATIVE
PH: 5.5 (ref 5.0–8.0)
RBC / HPF: NONE SEEN (ref 0–?)
Specific Gravity, Urine: 1.025 (ref 1.000–1.030)
TOTAL PROTEIN, URINE-UPE24: NEGATIVE
URINE GLUCOSE: NEGATIVE
Urobilinogen, UA: 0.2 (ref 0.0–1.0)
WBC, UA: NONE SEEN (ref 0–?)

## 2017-03-31 LAB — CBC WITH DIFFERENTIAL/PLATELET
BASOS ABS: 0.4 10*3/uL — AB (ref 0.0–0.1)
Basophils Relative: 2.7 % (ref 0.0–3.0)
Eosinophils Absolute: 0.5 10*3/uL (ref 0.0–0.7)
Eosinophils Relative: 3.5 % (ref 0.0–5.0)
HCT: 43.7 % (ref 39.0–52.0)
HEMOGLOBIN: 14.4 g/dL (ref 13.0–17.0)
LYMPHS ABS: 0.9 10*3/uL (ref 0.7–4.0)
Lymphocytes Relative: 6.5 % — ABNORMAL LOW (ref 12.0–46.0)
MCHC: 33.1 g/dL (ref 30.0–36.0)
MCV: 90.3 fl (ref 78.0–100.0)
MONO ABS: 0.4 10*3/uL (ref 0.1–1.0)
Monocytes Relative: 3.2 % (ref 3.0–12.0)
Neutro Abs: 11.7 10*3/uL — ABNORMAL HIGH (ref 1.4–7.7)
Neutrophils Relative %: 84.1 % — ABNORMAL HIGH (ref 43.0–77.0)
PLATELETS: 377 10*3/uL (ref 150.0–400.0)
RBC: 4.83 Mil/uL (ref 4.22–5.81)
RDW: 17.2 % — AB (ref 11.5–15.5)
WBC: 13.9 10*3/uL — ABNORMAL HIGH (ref 4.0–10.5)

## 2017-04-01 DIAGNOSIS — R310 Gross hematuria: Secondary | ICD-10-CM | POA: Diagnosis not present

## 2017-04-02 LAB — URINE CULTURE
MICRO NUMBER: 81342025
RESULT: NO GROWTH
SPECIMEN QUALITY: ADEQUATE

## 2017-04-05 DIAGNOSIS — R310 Gross hematuria: Secondary | ICD-10-CM | POA: Diagnosis not present

## 2017-04-19 DIAGNOSIS — R310 Gross hematuria: Secondary | ICD-10-CM | POA: Diagnosis not present

## 2017-06-06 ENCOUNTER — Other Ambulatory Visit: Payer: Self-pay | Admitting: Internal Medicine

## 2017-07-05 ENCOUNTER — Ambulatory Visit: Payer: 59 | Admitting: Internal Medicine

## 2017-08-01 ENCOUNTER — Ambulatory Visit (INDEPENDENT_AMBULATORY_CARE_PROVIDER_SITE_OTHER): Payer: Medicare Other | Admitting: Internal Medicine

## 2017-08-01 ENCOUNTER — Encounter: Payer: Self-pay | Admitting: Internal Medicine

## 2017-08-01 VITALS — HR 59 | Ht 67.0 in | Wt 144.0 lb

## 2017-08-01 DIAGNOSIS — H8110 Benign paroxysmal vertigo, unspecified ear: Secondary | ICD-10-CM | POA: Insufficient documentation

## 2017-08-01 DIAGNOSIS — G6289 Other specified polyneuropathies: Secondary | ICD-10-CM | POA: Diagnosis not present

## 2017-08-01 DIAGNOSIS — I10 Essential (primary) hypertension: Secondary | ICD-10-CM | POA: Diagnosis not present

## 2017-08-01 DIAGNOSIS — R269 Unspecified abnormalities of gait and mobility: Secondary | ICD-10-CM | POA: Insufficient documentation

## 2017-08-01 MED ORDER — MECLIZINE HCL 12.5 MG PO TABS: 12.5000 mg | ORAL_TABLET | Freq: Three times a day (TID) | ORAL | 1 refills | Status: DC | PRN

## 2017-08-01 NOTE — Assessment & Plan Note (Signed)
Mild, better today, ok for refill antivert prn

## 2017-08-01 NOTE — Progress Notes (Signed)
Subjective:    Patient ID: Jason Lane, male    DOB: 19-Aug-1927, 84 y.o.   MRN: [id number]  HPI  Here with 1 day onset positional dizziness assoc with mild left ear pressure but fever, HA, ST, cough and Pt denies chest pain, increased sob or doe, wheezing, orthopnea, PND, increased LE swelling, palpitations, dizziness or syncope.  Pt denies new neurological symptoms such as new headache, or facial or extremity weakness or numbness  Now back on 81 mg asa.  Had similar episode 6 yrs ago, and took a few 84 yo antivert med yesterday that seemed to help.  Also has known peripheral neuropathy, and recently also in last several wks has been sitting more, and now more unsteady to walk and slower gait.  No falls.  Has sisters cane at home but has not been using.   Pt denies polydipsia, polyuria Past Medical History:  Diagnosis Date  . ANEMIA-IRON DEFICIENCY 07/08/2010  . ASTHMA 12/03/2009   States no history to asthma  . BENIGN PROSTATIC HYPERTROPHY 12/03/2009  . Cancer ([place])    skin cancer  . DIVERTICULOSIS, COLON 12/03/2009  . FATIGUE 12/03/2009  . GERD 12/03/2009  . HYPERLIPIDEMIA 12/03/2009  . HYPERTENSION 02/01/2010  . HYPERTENSION NEC 12/03/2009  . MALIGNANT MELANOMA, HX OF 12/03/2009  . SKIN LESION 12/03/2009  . TOBACCO USE, QUIT 12/03/2009  . TREMOR 12/03/2009   Past Surgical History:  Procedure Laterality Date  . COLONOSCOPY    . HERNIA REPAIR    . INGUINAL HERNIA REPAIR Left 06/24/2015   Procedure: LAPAROSCOPIC REPAIR RECURRENT LEFT INGUINAL HERNIA;  Surgeon: Greer Pickerel, MD;  Location: [facility];  Service: General;  Laterality: Left;  . inguinal herniorrhapy  1970   right  . INSERTION OF MESH Left 06/24/2015   Procedure: INSERTION OF MESH;  Surgeon: Greer Pickerel, MD;  Location: [facility];  Service: General;  Laterality: Left;  . s/p left leg melanoma      reports that he has quit smoking. He has never used smokeless tobacco. He reports that he drinks about 4.2 oz of alcohol per week. He reports that he does not  use drugs. family history includes Colon cancer in his father. No Known Allergies Current Outpatient Medications on File Prior to Visit  Medication Sig Dispense Refill  . aspirin EC 81 MG tablet Take 1 tablet (81 mg total) by mouth daily. 90 tablet 11  . B Complex Vitamins (B COMPLEX 100 PO) Take by mouth daily.      . cholecalciferol (VITAMIN D-400) 400 UNITS TABS Take 400 Units by mouth daily.     . Cinnamon 500 MG capsule Take 500 mg by mouth daily.      [name] co-enzyme Q-10 30 MG capsule Take 30 mg by mouth 3 (three) times daily.    [name] gabapentin (NEURONTIN) 100 MG capsule Take 1 capsule (100 mg total) by mouth at bedtime. 90 capsule 1  . Multiple Vitamin (MULTIVITAMIN) capsule Take 1 capsule by mouth daily.      . tamsulosin (FLOMAX) 0.4 MG CAPS capsule TAKE 1 CAPSULE BY MOUTH EVERY DAY 90 capsule 1   No current facility-administered medications on file prior to visit.    Review of Systems  Constitutional: Negative for other unusual diaphoresis or sweats HENT: Negative for ear discharge or swelling Eyes: Negative for other worsening visual disturbances Respiratory: Negative for stridor or other swelling  Gastrointestinal: Negative for worsening distension or other blood Genitourinary: Negative for retention or other urinary change Musculoskeletal: Negative for  other MSK pain or swelling Skin: Negative for color change or other new lesions Neurological: Negative for worsening tremors and other numbness  Psychiatric/Behavioral: Negative for worsening agitation or other fatigue All other system neg per pt    Objective:   Physical Exam Pulse (!) 59   Ht 5\' 7"  (1.702 m)   Wt 144 lb (65.3 kg)   SpO2 98%   BMI 22.55 kg/m  VS noted, not ill appearing Constitutional: Pt appears in NAD HENT: Head: NCAT.  Right Ear: External ear normal.  Left Ear: External ear normal.  Left tm's with mild erythema.  Max sinus areas non tender.  Pharynx with mild erythema, no exudate Eyes: . Pupils are  equal, round, and reactive to light. Conjunctivae and EOM are normal Nose: without d/c or deformity Neck: Neck supple. Gross normal ROM Cardiovascular: Normal rate and regular rhythm.   Pulmonary/Chest: Effort normal and breath sounds without rales or wheezing.  Abd:  Soft, NT, ND, + BS, no organomegaly Neurological: Pt is alert. At baseline orientation, motor grossly intact but has increased general weakness, unsteady to standing up and walking in the room Skin: Skin is warm. No rashes, other new lesions, no LE edema Psychiatric: Pt behavior is normal without agitation  No other exam findings Lab Results  Component Value Date   WBC 13.9 (H) 03/31/2017   HGB 14.4 03/31/2017   HCT 43.7 03/31/2017   PLT 377.0 03/31/2017   GLUCOSE 107 (H) 03/31/2017   CHOL 121 01/04/2017   TRIG 128.0 01/04/2017   HDL 29.70 (L) 01/04/2017   LDLCALC 66 01/04/2017   ALT 16 01/04/2017   AST 28 01/04/2017   NA 139 03/31/2017   K 5.2 (H) 03/31/2017   CL 104 03/31/2017   CREATININE 0.94 03/31/2017   BUN 28 (H) 03/31/2017   CO2 28 03/31/2017   TSH 2.13 06/29/2016   PSA 3.85 12/15/2011   INR 1.2 (H) 12/15/2011   HGBA1C 5.6 01/04/2017      Assessment & Plan:

## 2017-08-01 NOTE — Assessment & Plan Note (Signed)
Stable, cont same tx 

## 2017-08-01 NOTE — Assessment & Plan Note (Signed)
Also for outpatient PT for eval and tx

## 2017-08-01 NOTE — Assessment & Plan Note (Signed)
stable overall by history and exam, recent data reviewed with pt, and pt to continue medical treatment as before,  to f/u any worsening symptoms or concerns BP Readings from Last 3 Encounters:  03/30/17 132/84  01/04/17 118/74  12/28/16 (!) 150/70

## 2017-08-01 NOTE — Patient Instructions (Signed)
Please use the cane out of the home for now  You will be contacted regarding the referral for: Physical therapy  Please take all new medication as prescribed - the antivert (meclizine)  Please continue all other medications as before, and refills have been done if requested.  Please have the pharmacy call with any other refills you may need.  Please keep your appointments with your specialists as you may have planned

## 2017-08-11 ENCOUNTER — Ambulatory Visit: Payer: Medicare Other | Attending: Internal Medicine | Admitting: Rehabilitation

## 2017-08-11 ENCOUNTER — Other Ambulatory Visit: Payer: Self-pay

## 2017-08-11 ENCOUNTER — Encounter: Payer: Self-pay | Admitting: Rehabilitation

## 2017-08-11 DIAGNOSIS — R293 Abnormal posture: Secondary | ICD-10-CM | POA: Diagnosis not present

## 2017-08-11 DIAGNOSIS — R531 Weakness: Secondary | ICD-10-CM | POA: Diagnosis not present

## 2017-08-11 DIAGNOSIS — R2689 Other abnormalities of gait and mobility: Secondary | ICD-10-CM | POA: Insufficient documentation

## 2017-08-11 DIAGNOSIS — R42 Dizziness and giddiness: Secondary | ICD-10-CM | POA: Diagnosis not present

## 2017-08-11 DIAGNOSIS — R2681 Unsteadiness on feet: Secondary | ICD-10-CM

## 2017-08-11 NOTE — Therapy (Signed)
[facility] [address] [place], [place], [address] Phone: [phone]   Fax:  [phone]  Physical Therapy Evaluation  Patient Details  Name: Jason Lane MRN: [id number] Date of Birth: 1927/08/22 Referring Provider: Cathlean Cower, MD   Encounter Date: 08/11/2017  PT End of Session - 08/11/17 0945    Visit Number  1    Number of Visits  13    Date for PT Re-Evaluation  44/96/75 cert written for 60 days    Authorization Type  MCR    PT Start Time  0845    PT Stop Time  0930    PT Time Calculation (min)  45 min    Activity Tolerance  Patient tolerated treatment well    Behavior During Therapy  [facility] for tasks assessed/performed       Past Medical History:  Diagnosis Date  . ANEMIA-IRON DEFICIENCY 07/08/2010  . ASTHMA 12/03/2009   States no history to asthma  . BENIGN PROSTATIC HYPERTROPHY 12/03/2009  . Cancer ([place])    skin cancer  . DIVERTICULOSIS, COLON 12/03/2009  . FATIGUE 12/03/2009  . GERD 12/03/2009  . HYPERLIPIDEMIA 12/03/2009  . HYPERTENSION 02/01/2010  . HYPERTENSION NEC 12/03/2009  . MALIGNANT MELANOMA, HX OF 12/03/2009  . SKIN LESION 12/03/2009  . TOBACCO USE, QUIT 12/03/2009  . TREMOR 12/03/2009    Past Surgical History:  Procedure Laterality Date  . COLONOSCOPY    . HERNIA REPAIR    . INGUINAL HERNIA REPAIR Left 06/24/2015   Procedure: LAPAROSCOPIC REPAIR RECURRENT LEFT INGUINAL HERNIA;  Surgeon: Greer Pickerel, MD;  Location: [facility];  Service: General;  Laterality: Left;  . inguinal herniorrhapy  1970   right  . INSERTION OF MESH Left 06/24/2015   Procedure: INSERTION OF MESH;  Surgeon: Greer Pickerel, MD;  Location: [place];  Service: General;  Laterality: Left;  . s/p left leg melanoma      There were no vitals filed for this visit.   Subjective Assessment - 08/11/17 0849    Subjective  "I was out of it for about a year.  I had a couple of operations and I was inactive for a few months.  Then I was up on a ladder  cleaning gutters approx 2 months ago and I had a fall into a bush on my right side.  It was black and blue.  I didn't go to the doctor.   So I was off of my feet again for a while, so now I am very weak."     Pertinent History  HTN, peripheral neuropathy, HDL, hx of malignant melanoma    Limitations  House hold activities;Walking;Lifting    Patient Stated Goals  "I want to get advice on how to strengthen my legs."     Currently in Pain?  No/denies         [facility] PT Assessment - 08/11/17 0001      Assessment   Medical Diagnosis  Gait instability, dizziness    Referring Provider  Cathlean Cower, MD    Onset Date/Surgical Date  -- Began approx 2 months ago s/p fall      Precautions   Precautions  Fall      Balance Screen   Has the patient fallen in the past 6 months  Yes    How many times?  1    Has the patient had a decrease in activity level because of a fear of falling?   No    Is the  patient reluctant to leave their home because of a fear of falling?   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      Sensation   Light Touch  Impaired Detail    Light Touch Impaired Details  Impaired RLE;Impaired LLE history of PN, from midfoot to forefoot    Hot/Cold  Appears Intact    Proprioception  Appears Intact      Coordination   Gross Motor Movements are Fluid and Coordinated  Yes    Fine Motor Movements are Fluid and Coordinated  Yes    Heel Shin Test  WFL      ROM / Strength   AROM / PROM / Strength  Strength      Strength   Overall Strength  Deficits    Overall Strength  Comments  overall BLEs 4/5, L hip abd 3+/5 (short lever), R hip abd 3+/5 (long lever)      Transfers   Transfers  Sit to Stand;Stand to Sit    Sit to Stand  7: Independent    Five time sit to stand comments   8.19 secs without UE support did have slight posterior lean initially    Stand to Sit  7: Independent      Ambulation/Gait   Ambulation/Gait  Yes    Ambulation/Gait Assistance  5: Supervision;4: Min guard min/guard with balance challenges    Ambulation/Gait Assistance Details  min/guard to min A when performing head turns, forward gait with eyes closed and backwards gait    Ambulation Distance (Feet)  300 Feet    Assistive device  None    Gait Pattern  Step-through pattern;Decreased stride length;Right flexed knee in stance;Left flexed knee in stance;Trunk flexed;Narrow base of support narrow BOS with balance challenges    Ambulation Surface  Level;Indoor    Gait velocity  3.56 ft/sec     Stairs  Yes    Stairs Assistance  6: Modified independent (Device/Increase time)    Stair Management Technique  No rails;Alternating pattern;Forwards did have mild LOB and needed to touch arm to rail    Number of Stairs  4    Height of Stairs  6      Functional Gait  Assessment   Gait assessed   Yes    Gait Level Surface  Walks 20 ft in less than 5.5 sec, no assistive devices, good speed, no evidence for imbalance, normal gait pattern, deviates no more than 6 in outside of the 12 in walkway width.    Change in Gait Speed  Able to smoothly change walking speed without loss of balance or gait deviation. Deviate no more than 6 in outside of the 12 in walkway width.    Gait with Horizontal Head Turns  Performs head turns with moderate changes in gait velocity, slows down, deviates 10-15 in outside 12 in walkway width but  recovers, can continue to walk.    Gait with Vertical Head Turns  Performs task with slight change in gait velocity (eg, minor disruption to smooth gait path), deviates 6 - 10 in  outside 12 in walkway width or uses assistive device    Gait and Pivot Turn  Pivot turns safely in greater than 3 sec and stops with no loss of balance, or pivot turns safely within 3 sec and stops with mild imbalance, requires small steps to catch balance.    Step Over Obstacle  Is able to step over one shoe box (4.5 in total height) without changing gait speed. No evidence of imbalance.    Gait with Narrow Base of Support  Ambulates 4-7 steps.    Gait with Eyes Closed  Walks 20 ft, uses assistive device, slower speed, mild gait deviations, deviates 6-10 in outside 12 in walkway width. Ambulates 20 ft in less than 9 sec but greater than 7 sec.    Ambulating Backwards  Walks 20 ft, slow speed, abnormal gait pattern, evidence for imbalance, deviates 10-15 in outside 12 in walkway width.    Steps  Alternating feet, no rail.    Total Score  20    FGA comment:  19-24 = medium risk fall           Vestibular Assessment - 08/11/17 0001      Vestibular Assessment   General Observation  normal       Symptom Behavior   Type of Dizziness  Spinning    Frequency of Dizziness  short, getting better    Duration of Dizziness  short    Aggravating Factors  Supine to sit    Relieving Factors  Rest      Occulomotor Exam   Occulomotor Alignment  Normal    Spontaneous  Absent      Positional Testing   Dix-Hallpike  Dix-Hallpike Right;Dix-Hallpike Left    Sidelying Test  Sidelying Right;Sidelying Left      Dix-Hallpike Right   Dix-Hallpike Right Duration  none he does feel dizziness with sitting back up, R/L dix hallpik    Dix-Hallpike Right Symptoms  No nystagmus      Dix-Hallpike Left   Dix-Hallpike Left Duration  none    Dix-Hallpike Left Symptoms  No nystagmus      Sidelying Right   Sidelying Right Duration  none    Sidelying Right Symptoms  No nystagmus      Sidelying Left   Sidelying Left Duration  none    Sidelying Left Symptoms  No nystagmus          Objective measurements  completed on examination: See above findings.    Therex:  Provided and went over SL habituation exercises.  See pt instruction for details.           PT Education - 08/11/17 0940    Education provided  Yes    Education Details  POC, goals, vestibular exercises    Person(s) Educated  Patient    Methods  Explanation    Comprehension  Verbalized understanding       PT Short Term Goals - 08/11/17 0951      PT SHORT TERM GOAL #1   Title  Pt will initiate HEP in order to indicate improved functional strength and decreased fall risk.  (Target Date: 08/25/17)    Time  2    Period  Weeks    Status  New    Target Date  08/25/17  PT SHORT TERM GOAL #2   Title  Pt will report zero dizziness with functional tasks in order to indicate resolution of any positional vertigo.      Time  2    Period  Weeks    Status  New      PT SHORT TERM GOAL #3   Title  Will assess 6MWT and write appropriate LTG to address endurance deficits.     Time  2    Period  Weeks    Status  New        PT Long Term Goals - 08/11/17 0953      PT LONG TERM GOAL #1   Title  Pt will be independent with HEP in order to indicate improved functional mobility and decreased fall risk.  (Target Date: 09/25/17)    Time  6    Status  New    Target Date  09/25/17      PT LONG TERM GOAL #2   Title  Pt will improve FGA to >/=23/30 in order to indicate decreased fall risk.      Time  6    Period  Weeks    Status  New      PT LONG TERM GOAL #3   Title  Pt will verbalize plans to begin community fitness program in order to maintain gains made in therapy.      Time  6    Period  Weeks    Status  New      PT LONG TERM GOAL #4   Title  Pt will ambulate 1000' at independent level over varying indoor and outdoor surfaces while scanning environment without overt LOB in order to indicate safe return to community mobility.      Time  6    Period  Weeks    Status  New      PT LONG TERM GOAL #5   Title  Pt will  be able to perform floor to stand transfer without external support in order to return to daily activities safely.      Time  6    Period  Weeks    Status  New             Plan - 08/11/17 0946    Clinical Impression Statement  Pt presents with declining BLE strength, decreased balance and new onset of dizziness in past 1-2 months following fall from ladder.  Pt with no reported injuries other than bruising, however did not go to urgent care to have injuries assessed.  Pt with history of HTN, borderline DM with peripheral neuropathy, HDL, and remote history of melanoma.  Upon PT evaluation, note 5TSS WFL, however did note slightly posterior LOB with first 2 reps, gait speed is 3.56 ft/sec which is above fall risk, however FGA was 20/30 placing pt at moderate fall risk.  Pt will benefit from skilled OP neuro in order to address deficits.      History and Personal Factors relevant to plan of care:  see above    Clinical Presentation  Evolving    Clinical Decision Making  Moderate    Rehab Potential  Good    PT Frequency  2x / week    PT Duration  6 weeks    PT Treatment/Interventions  ADLs/Self Care Home Management;Canalith Repostioning;Gait training;Stair training;Functional mobility training;Therapeutic activities;Therapeutic exercise;Balance training;Neuromuscular re-education;Patient/family education;Manual techniques;Passive range of motion;Energy conservation;Vestibular    PT Next Visit Plan  6MWT, continue to assess for BPPV (  would also like to check for hypofunction)-give exercises as needed, initiate HEP for BLE strength, flexibility (esp hamstrings), and balance.     PT Home Exercise Plan  Provided with SL vestibular habituation exercises 08/11/17    Consulted and Agree with Plan of Care  Patient       Patient will benefit from skilled therapeutic intervention in order to improve the following deficits and impairments:  Decreased activity tolerance, Decreased balance, Decreased  endurance, Decreased mobility, Decreased range of motion, Impaired flexibility, Dizziness, Decreased strength, Impaired sensation, Postural dysfunction  Visit Diagnosis: Unsteadiness on feet  Dizziness and giddiness  Other abnormalities of gait and mobility  Weakness generalized  Abnormal posture     Problem List Patient Active Problem List   Diagnosis Date Noted  . Gait disorder 08/01/2017  . BPPV (benign paroxysmal positional vertigo) 08/01/2017  . Gross hematuria 03/30/2017  . BRBPR (bright red blood per rectum) 03/30/2017  . Rotator cuff tear arthropathy of both shoulders 11/30/2016  . Hyperglycemia 06/29/2016  . S/P laparoscopic hernia repair 06/24/2015  . Inguinal hernia, left 05/27/2015  . Skin lesion 05/27/2015  . Bradycardia 12/24/2014  . Chest pain 06/23/2012  . Peripheral neuropathy 12/19/2011  . Hematochezia 12/15/2011  . Leg cramps 12/15/2011  . Bladder neck obstruction 12/15/2011  . Vertigo 09/11/2011  . Preventative health care 09/09/2010  . ANEMIA-IRON DEFICIENCY 07/08/2010  . Essential hypertension 02/01/2010  . Hyperlipidemia 12/03/2009  . Asthma 12/03/2009  . GERD 12/03/2009  . DIVERTICULOSIS, COLON 12/03/2009  . BPH associated with nocturia 12/03/2009  . FATIGUE 12/03/2009  . TREMOR 12/03/2009  . MALIGNANT MELANOMA, HX OF 12/03/2009  . TOBACCO USE, QUIT 12/03/2009    Cameron Sprang, PT, MPT [facility] [address] [place], [place], [address] Phone: [phone]   Fax:  [phone] 08/11/17, 9:59 AM  Name: Jason Lane MRN: [id number] Date of Birth: Oct 16, 1927

## 2017-08-11 NOTE — Patient Instructions (Signed)
Tip Card 1.The goal of habituation training is to assist in decreasing symptoms of vertigo, dizziness, or nausea provoked by specific head and body motions. 2.These exercises may initially increase symptoms; however, be persistent and work through symptoms. With repetition and time, the exercises will assist in reducing or eliminating symptoms. 3.Exercises should be stopped and discussed with the therapist if you experience any of the following: - Sudden change or fluctuation in hearing - New onset of ringing in the ears, or increase in current intensity - Any fluid discharge from the ear - Severe pain in neck or back - Extreme nausea   Copyright  VHI. All rights reserved.  Sit to Side-Lying   Sit on edge of bed. Lie down onto the right side and hold until dizziness stops, plus 20 seconds.  Return to sitting and wait until dizziness stops, plus 20 seconds.  Repeat to the left side. Repeat sequence 5 times per session. Do 2 sessions per day.

## 2017-08-15 ENCOUNTER — Other Ambulatory Visit: Payer: Self-pay | Admitting: Internal Medicine

## 2017-08-16 ENCOUNTER — Encounter: Payer: Self-pay | Admitting: Physical Therapy

## 2017-08-16 ENCOUNTER — Ambulatory Visit: Payer: Medicare Other | Admitting: Physical Therapy

## 2017-08-16 VITALS — BP 182/74

## 2017-08-16 DIAGNOSIS — R531 Weakness: Secondary | ICD-10-CM

## 2017-08-16 DIAGNOSIS — R2689 Other abnormalities of gait and mobility: Secondary | ICD-10-CM

## 2017-08-16 DIAGNOSIS — R2681 Unsteadiness on feet: Secondary | ICD-10-CM | POA: Diagnosis not present

## 2017-08-16 DIAGNOSIS — R42 Dizziness and giddiness: Secondary | ICD-10-CM

## 2017-08-16 DIAGNOSIS — R293 Abnormal posture: Secondary | ICD-10-CM | POA: Diagnosis not present

## 2017-08-16 NOTE — Patient Instructions (Addendum)
Perform these in the corner with a chair in front for safety: Feet Apart (Compliant Surface) Varied Arm Positions - Eyes Closed    Stand on compliant surface: pillow/s with feet shoulder width apart and arms at sides. Close eyes and visualize upright position. Hold_30_ seconds. Repeat _3_ times per session. Do _1-2_ sessions per day.  Copyright  VHI. All rights reserved.    Feet Together (Compliant Surface) Head Motion - Eyes Open    With eyes open, standing on compliant surface: _pillow/s_, feet together, move head slowly:  1. Up and down  X 10 reps 2. Left and right x 10 reps  Do _1-2_ sessions per day.  Copyright  VHI. All rights reserved.  With support on chair back as needed in corner:   Single Leg - Eyes Open    Holding support: Stand on left leg by lifting right foot up as shown. Hold for 10 seconds. Then, stand on right leg by lifting up left foot as shown. Hold for 10 seconds. Perform 3 reps each leg. 1-2 times a day.  Copyright  VHI. All rights reserved.        TANDEM STANCE WITH SUPPORT  Hold for support: Then place the heel of one foot so that it is touching the toes of the other foot. Maintain your balance in this position for 30 seconds. Then switch feet around. Hold for 30 seconds.  Perform 2 reps with each foot forward. 1-2 times a day.

## 2017-08-16 NOTE — Therapy (Signed)
[facility] [address] [place], [place], [address] Phone: [phone]   Fax:  [phone]  Physical Therapy Treatment  Patient Details  Name: Jason Lane MRN: [id number] Date of Birth: 1928/04/27 Referring Provider: Cathlean Cower, MD   Encounter Date: 08/16/2017  PT End of Session - 08/16/17 1321    Visit Number  2    Number of Visits  13    Date for PT Re-Evaluation  20/25/42 cert written for 60 days    Authorization Type  MCR    PT Start Time  1319    PT Stop Time  1400    PT Time Calculation (min)  41 min    Equipment Utilized During Treatment  Gait belt    Activity Tolerance  Patient tolerated treatment well    Behavior During Therapy  [facility] for tasks assessed/performed       Past Medical History:  Diagnosis Date  . ANEMIA-IRON DEFICIENCY 07/08/2010  . ASTHMA 12/03/2009   States no history to asthma  . BENIGN PROSTATIC HYPERTROPHY 12/03/2009  . Cancer ([place])    skin cancer  . DIVERTICULOSIS, COLON 12/03/2009  . FATIGUE 12/03/2009  . GERD 12/03/2009  . HYPERLIPIDEMIA 12/03/2009  . HYPERTENSION 02/01/2010  . HYPERTENSION NEC 12/03/2009  . MALIGNANT MELANOMA, HX OF 12/03/2009  . SKIN LESION 12/03/2009  . TOBACCO USE, QUIT 12/03/2009  . TREMOR 12/03/2009    Past Surgical History:  Procedure Laterality Date  . COLONOSCOPY    . HERNIA REPAIR    . INGUINAL HERNIA REPAIR Left 06/24/2015   Procedure: LAPAROSCOPIC REPAIR RECURRENT LEFT INGUINAL HERNIA;  Surgeon: Greer Pickerel, MD;  Location: [facility];  Service: General;  Laterality: Left;  . inguinal herniorrhapy  1970   right  . INSERTION OF MESH Left 06/24/2015   Procedure: INSERTION OF MESH;  Surgeon: Greer Pickerel, MD;  Location: [facility];  Service: General;  Laterality: Left;  . s/p left leg melanoma      Vitals:   08/16/17 1341  BP: (!) 182/74    Subjective Assessment - 08/16/17 1321    Subjective  No new complaitns. No falls or pain to report.     Pertinent History  HTN,  peripheral neuropathy, HDL, hx of malignant melanoma    Limitations  House hold activities;Walking;Lifting    Patient Stated Goals  "I want to get advice on how to strengthen my legs."     Currently in Pain?  No/denies         [facility] PT Assessment - 08/16/17 1322      6 Minute Walk- Baseline   6 Minute Walk- Baseline  yes    BP (mmHg)  187/80    HR (bpm)  85    02 Sat (%RA)  97 %    Modified Borg Scale for Dyspnea  0- Nothing at all    Perceived Rate of Exertion (Borg)  6-      6 Minute walk- Post Test   6 Minute Walk Post Test  yes    BP (mmHg)  199/89    HR (bpm)  62    02 Sat (%RA)  99 %    Modified Borg Scale for Dyspnea  0.5- Very, very slight shortness of breath    Perceived Rate of Exertion (Borg)  7- Very, very light      6 minute walk test results    Aerobic Endurance Distance Walked  [phone]    Endurance additional comments  no AD, no breaks  taken        Issued the following to pt's HEP today with min guard assist for balance. Cues on ex form and technique.  Perform these in the corner with a chair in front for safety: Feet Apart (Compliant Surface) Varied Arm Positions - Eyes Closed    Stand on compliant surface: pillow/s with feet shoulder width apart and arms at sides. Close eyes and visualize upright position. Hold_30_ seconds. Repeat _3_ times per session. Do _1-2_ sessions per day.  Copyright  VHI. All rights reserved.    Feet Together (Compliant Surface) Head Motion - Eyes Open    With eyes open, standing on compliant surface: _pillow/s_, feet together, move head slowly:  1. Up and down  X 10 reps 2. Left and right x 10 reps  Do _1-2_ sessions per day.  Copyright  VHI. All rights reserved.  With support on chair back as needed in corner:   Single Leg - Eyes Open    Holding support: Stand on left leg by lifting right foot up as shown. Hold for 10 seconds. Then, stand on right leg by lifting up left foot as shown. Hold for 10  seconds. Perform 3 reps each leg. 1-2 times a day.  Copyright  VHI. All rights reserved.        TANDEM STANCE WITH SUPPORT  Hold for support: Then place the heel of one foot so that it is touching the toes of the other foot. Maintain your balance in this position for 30 seconds. Then switch feet around. Hold for 30 seconds.  Perform 2 reps with each foot forward. 1-2 times a day.      PT Education - 08/16/17 1357    Education provided  Yes    Education Details  results of 6 minute walk test; HEP for balance    Person(s) Educated  Patient    Methods  Explanation;Demonstration;Verbal cues;Handout    Comprehension  Verbalized understanding;Returned demonstration;Verbal cues required;Need further instruction       PT Short Term Goals - 08/11/17 0951      PT SHORT TERM GOAL #1   Title  Pt will initiate HEP in order to indicate improved functional strength and decreased fall risk.  (Target Date: 08/25/17)    Time  2    Period  Weeks    Status  New    Target Date  08/25/17      PT SHORT TERM GOAL #2   Title  Pt will report zero dizziness with functional tasks in order to indicate resolution of any positional vertigo.      Time  2    Period  Weeks    Status  New      PT SHORT TERM GOAL #3   Title  Will assess 6MWT and write appropriate LTG to address endurance deficits.     Time  2    Period  Weeks    Status  New        PT Long Term Goals - 08/11/17 0953      PT LONG TERM GOAL #1   Title  Pt will be independent with HEP in order to indicate improved functional mobility and decreased fall risk.  (Target Date: 09/25/17)    Time  6    Status  New    Target Date  09/25/17      PT LONG TERM GOAL #2   Title  Pt will improve FGA to >/=23/30 in order to indicate decreased fall risk.  Time  6    Period  Weeks    Status  New      PT LONG TERM GOAL #3   Title  Pt will verbalize plans to begin community fitness program in order to maintain gains made in therapy.       Time  6    Period  Weeks    Status  New      PT LONG TERM GOAL #4   Title  Pt will ambulate 1000' at independent level over varying indoor and outdoor surfaces while scanning environment without overt LOB in order to indicate safe return to community mobility.      Time  6    Period  Weeks    Status  New      PT LONG TERM GOAL #5   Title  Pt will be able to perform floor to stand transfer without external support in order to return to daily activities safely.      Time  6    Period  Weeks    Status  New            Plan - 08/16/17 1322    Clinical Impression Statement  Today's skilled session initially focused on establishing baseline values for the 6 minute walk test. Remainder of today's session focused on establishment of an HEP to address balance. Pt is progressing toward goals. Challenged by complaint surfaces and with vision removed. Pt should benefit from continued PT to progress toward unmet goals.    Rehab Potential  Good    PT Frequency  2x / week    PT Duration  6 weeks    PT Treatment/Interventions  ADLs/Self Care Home Management;Canalith Repostioning;Gait training;Stair training;Functional mobility training;Therapeutic activities;Therapeutic exercise;Balance training;Neuromuscular re-education;Patient/family education;Manual techniques;Passive range of motion;Energy conservation;Vestibular    PT Next Visit Plan  continue to work on ex's for BLE strength, flexibility (esp hamstrings), and balance- emphasis on compliant surfaces and vision removed    PT Home Exercise Plan  Provided with SL vestibular habituation exercises 08/11/17    Consulted and Agree with Plan of Care  Patient       Patient will benefit from skilled therapeutic intervention in order to improve the following deficits and impairments:  Decreased activity tolerance, Decreased balance, Decreased endurance, Decreased mobility, Decreased range of motion, Impaired flexibility, Dizziness, Decreased strength,  Impaired sensation, Postural dysfunction  Visit Diagnosis: Unsteadiness on feet  Dizziness and giddiness  Other abnormalities of gait and mobility  Weakness generalized     Problem List Patient Active Problem List   Diagnosis Date Noted  . Gait disorder 08/01/2017  . BPPV (benign paroxysmal positional vertigo) 08/01/2017  . Gross hematuria 03/30/2017  . BRBPR (bright red blood per rectum) 03/30/2017  . Rotator cuff tear arthropathy of both shoulders 11/30/2016  . Hyperglycemia 06/29/2016  . S/P laparoscopic hernia repair 06/24/2015  . Inguinal hernia, left 05/27/2015  . Skin lesion 05/27/2015  . Bradycardia 12/24/2014  . Chest pain 06/23/2012  . Peripheral neuropathy 12/19/2011  . Hematochezia 12/15/2011  . Leg cramps 12/15/2011  . Bladder neck obstruction 12/15/2011  . Vertigo 09/11/2011  . Preventative health care 09/09/2010  . ANEMIA-IRON DEFICIENCY 07/08/2010  . Essential hypertension 02/01/2010  . Hyperlipidemia 12/03/2009  . Asthma 12/03/2009  . GERD 12/03/2009  . DIVERTICULOSIS, COLON 12/03/2009  . BPH associated with nocturia 12/03/2009  . FATIGUE 12/03/2009  . TREMOR 12/03/2009  . MALIGNANT MELANOMA, HX OF 12/03/2009  . TOBACCO USE, QUIT 12/03/2009    Juliann Pulse  Johnsie Cancel, [facility] [address] [place], [place] [address] [phone] 08/16/17, 9:23 PM   Name: Dory Verdun MRN: [id number] Date of Birth: [birth year]

## 2017-08-17 ENCOUNTER — Encounter: Payer: Self-pay | Admitting: Physical Therapy

## 2017-08-17 ENCOUNTER — Ambulatory Visit: Payer: Medicare Other | Admitting: Physical Therapy

## 2017-08-17 DIAGNOSIS — R2689 Other abnormalities of gait and mobility: Secondary | ICD-10-CM

## 2017-08-17 DIAGNOSIS — R2681 Unsteadiness on feet: Secondary | ICD-10-CM

## 2017-08-17 DIAGNOSIS — R42 Dizziness and giddiness: Secondary | ICD-10-CM | POA: Diagnosis not present

## 2017-08-17 DIAGNOSIS — R293 Abnormal posture: Secondary | ICD-10-CM | POA: Diagnosis not present

## 2017-08-17 DIAGNOSIS — R531 Weakness: Secondary | ICD-10-CM

## 2017-08-17 NOTE — Therapy (Signed)
[facility] [address] [place], [place], [address] Phone: [phone]   Fax:  [phone]  Physical Therapy Treatment  Patient Details  Name: Jason Lane MRN: [id number] Date of Birth: 1927-12-03 Referring Provider: Cathlean Cower, MD   Encounter Date: 08/17/2017  PT End of Session - 08/17/17 0940    Visit Number  3    Number of Visits  13    Date for PT Re-Evaluation  24/26/83 cert written for 60 days    Authorization Type  MCR    PT Start Time  0935    PT Stop Time  1015    PT Time Calculation (min)  40 min    Equipment Utilized During Treatment  Gait belt    Activity Tolerance  Patient tolerated treatment well    Behavior During Therapy  [facility] for tasks assessed/performed       Past Medical History:  Diagnosis Date  . ANEMIA-IRON DEFICIENCY 07/08/2010  . ASTHMA 12/03/2009   States no history to asthma  . BENIGN PROSTATIC HYPERTROPHY 12/03/2009  . Cancer ([place])    skin cancer  . DIVERTICULOSIS, COLON 12/03/2009  . FATIGUE 12/03/2009  . GERD 12/03/2009  . HYPERLIPIDEMIA 12/03/2009  . HYPERTENSION 02/01/2010  . HYPERTENSION NEC 12/03/2009  . MALIGNANT MELANOMA, HX OF 12/03/2009  . SKIN LESION 12/03/2009  . TOBACCO USE, QUIT 12/03/2009  . TREMOR 12/03/2009    Past Surgical History:  Procedure Laterality Date  . COLONOSCOPY    . HERNIA REPAIR    . INGUINAL HERNIA REPAIR Left 06/24/2015   Procedure: LAPAROSCOPIC REPAIR RECURRENT LEFT INGUINAL HERNIA;  Surgeon: Greer Pickerel, MD;  Location: [facility];  Service: General;  Laterality: Left;  . inguinal herniorrhapy  1970   right  . INSERTION OF MESH Left 06/24/2015   Procedure: INSERTION OF MESH;  Surgeon: Greer Pickerel, MD;  Location: [facility];  Service: General;  Laterality: Left;  . s/p left leg melanoma      There were no vitals filed for this visit.  Subjective Assessment - 08/17/17 0939    Subjective  No new complaitns. NO falls or pain to report.     Pertinent History  HTN,  peripheral neuropathy, HDL, hx of malignant melanoma    Limitations  House hold activities;Walking;Lifting    Patient Stated Goals  "I want to get advice on how to strengthen my legs."     Currently in Pain?  No/denies          [facility] Adult PT Treatment/Exercise - 08/17/17 0940      High Level Balance   High Level Balance Activities  Tandem walking;Marching forwards;Marching backwards tandem/toe/heel walking fwd/bwd    High Level Balance Comments  on both red mats next to counter top: 3 laps each with light UE support on counter. cues on posture and ex form/technique with min guard to min assist for balance.           Balance Exercises - 08/17/17 1004      Balance Exercises: Standing   SLS with Vectors  Solid surface;Limitations;Foam/compliant surface;Other reps (comment)    Rockerboard  Anterior/posterior;Lateral;Head turns;EO;EC;30 seconds;10 reps      Balance Exercises: Standing   SLS with Vectors Limitations  on floor, then on airex with 2 tall cones in front: alternating fwd toe taps, then alternating cross toe taps. 10 reps each on each surface. min assist for balance with cues to slow down, for stance position and for weight shifitng.    Rebounder  Limitations  performed both ways on balance board with no UE support- EO rocking board with emphasis on tall posture, progressing to holding board steady for EC no head movements, then EC head movements left<>right, up<>down and diagonals both ways. min guard to min assist with cues on posture, base of support and weight shifting to assist with balance recovery.           PT Short Term Goals - 08/11/17 0951      PT SHORT TERM GOAL #1   Title  Pt will initiate HEP in order to indicate improved functional strength and decreased fall risk.  (Target Date: 08/25/17)    Time  2    Period  Weeks    Status  New    Target Date  08/25/17      PT SHORT TERM GOAL #2   Title  Pt will report zero dizziness with functional tasks in order  to indicate resolution of any positional vertigo.      Time  2    Period  Weeks    Status  New      PT SHORT TERM GOAL #3   Title  Will assess 6MWT and write appropriate LTG to address endurance deficits.     Time  2    Period  Weeks    Status  New        PT Long Term Goals - 08/11/17 0953      PT LONG TERM GOAL #1   Title  Pt will be independent with HEP in order to indicate improved functional mobility and decreased fall risk.  (Target Date: 09/25/17)    Time  6    Status  New    Target Date  09/25/17      PT LONG TERM GOAL #2   Title  Pt will improve FGA to >/=23/30 in order to indicate decreased fall risk.      Time  6    Period  Weeks    Status  New      PT LONG TERM GOAL #3   Title  Pt will verbalize plans to begin community fitness program in order to maintain gains made in therapy.      Time  6    Period  Weeks    Status  New      PT LONG TERM GOAL #4   Title  Pt will ambulate 1000' at independent level over varying indoor and outdoor surfaces while scanning environment without overt LOB in order to indicate safe return to community mobility.      Time  6    Period  Weeks    Status  New      PT LONG TERM GOAL #5   Title  Pt will be able to perform floor to stand transfer without external support in order to return to daily activities safely.      Time  6    Period  Weeks    Status  New            Plan - 08/17/17 0940    Clinical Impression Statement  Today's skilled session focused on balance reactions on complaint surfaces and/or with vision removed. Pt continues to demo decreased reactions when vision is removed, especially on complaint surfaces. Did demo improved posture when cued with ex's. Pt is making progress toward goals and should benefit from continued PT to progress toward unmet goals.     Rehab Potential  Good  PT Frequency  2x / week    PT Duration  6 weeks    PT Treatment/Interventions  ADLs/Self Care Home Management;Canalith  Repostioning;Gait training;Stair training;Functional mobility training;Therapeutic activities;Therapeutic exercise;Balance training;Neuromuscular re-education;Patient/family education;Manual techniques;Passive range of motion;Energy conservation;Vestibular    PT Next Visit Plan  continue to work on ex's for BLE strength, flexibility (esp hamstrings), and balance- emphasis on compliant surfaces and vision removed    PT Home Exercise Plan  Provided with SL vestibular habituation exercises 08/11/17    Consulted and Agree with Plan of Care  Patient       Patient will benefit from skilled therapeutic intervention in order to improve the following deficits and impairments:  Decreased activity tolerance, Decreased balance, Decreased endurance, Decreased mobility, Decreased range of motion, Impaired flexibility, Dizziness, Decreased strength, Impaired sensation, Postural dysfunction  Visit Diagnosis: Unsteadiness on feet  Dizziness and giddiness  Other abnormalities of gait and mobility  Weakness generalized  Abnormal posture     Problem List Patient Active Problem List   Diagnosis Date Noted  . Gait disorder 08/01/2017  . BPPV (benign paroxysmal positional vertigo) 08/01/2017  . Gross hematuria 03/30/2017  . BRBPR (bright red blood per rectum) 03/30/2017  . Rotator cuff tear arthropathy of both shoulders 11/30/2016  . Hyperglycemia 06/29/2016  . S/P laparoscopic hernia repair 06/24/2015  . Inguinal hernia, left 05/27/2015  . Skin lesion 05/27/2015  . Bradycardia 12/24/2014  . Chest pain 06/23/2012  . Peripheral neuropathy 12/19/2011  . Hematochezia 12/15/2011  . Leg cramps 12/15/2011  . Bladder neck obstruction 12/15/2011  . Vertigo 09/11/2011  . Preventative health care 09/09/2010  . ANEMIA-IRON DEFICIENCY 07/08/2010  . Essential hypertension 02/01/2010  . Hyperlipidemia 12/03/2009  . Asthma 12/03/2009  . GERD 12/03/2009  . DIVERTICULOSIS, COLON 12/03/2009  . BPH associated  with nocturia 12/03/2009  . FATIGUE 12/03/2009  . TREMOR 12/03/2009  . MALIGNANT MELANOMA, HX OF 12/03/2009  . TOBACCO USE, QUIT 12/03/2009    Willow Ora, PTA, [facility] [address], [place], [place] [address] [phone] 08/17/17, 11:47 AM   Name: Jason Lane MRN: [id number] Date of Birth: 05/20/1927

## 2017-08-22 ENCOUNTER — Ambulatory Visit: Payer: Medicare Other | Admitting: Physical Therapy

## 2017-08-26 ENCOUNTER — Ambulatory Visit: Payer: Medicare Other | Admitting: Rehabilitation

## 2017-08-30 ENCOUNTER — Ambulatory Visit: Payer: Medicare Other | Admitting: Rehabilitation

## 2017-09-01 ENCOUNTER — Ambulatory Visit: Payer: Medicare Other | Admitting: Physical Therapy

## 2017-09-06 ENCOUNTER — Ambulatory Visit: Payer: Medicare Other | Admitting: Rehabilitation

## 2017-09-09 ENCOUNTER — Ambulatory Visit: Payer: Medicare Other | Admitting: Rehabilitation

## 2017-09-12 ENCOUNTER — Ambulatory Visit: Payer: Medicare Other | Admitting: Rehabilitation

## 2017-09-15 ENCOUNTER — Ambulatory Visit: Payer: Medicare Other | Admitting: Rehabilitation

## 2017-09-19 ENCOUNTER — Ambulatory Visit: Payer: Medicare Other | Admitting: Rehabilitation

## 2017-09-22 ENCOUNTER — Ambulatory Visit: Payer: Medicare Other | Admitting: Rehabilitation

## 2018-02-16 ENCOUNTER — Other Ambulatory Visit: Payer: Self-pay

## 2018-02-16 ENCOUNTER — Emergency Department (HOSPITAL_COMMUNITY): Payer: Medicare Other

## 2018-02-16 ENCOUNTER — Encounter (HOSPITAL_COMMUNITY): Payer: Self-pay | Admitting: Emergency Medicine

## 2018-02-16 ENCOUNTER — Inpatient Hospital Stay (HOSPITAL_COMMUNITY)
Admission: EM | Admit: 2018-02-16 | Discharge: 2018-02-22 | DRG: 835 | Disposition: A | Discharge status: Skilled Nursing Facility | Payer: Medicare Other | Attending: Internal Medicine | Admitting: Internal Medicine

## 2018-02-16 DIAGNOSIS — J810 Acute pulmonary edema: Secondary | ICD-10-CM | POA: Diagnosis not present

## 2018-02-16 DIAGNOSIS — R531 Weakness: Secondary | ICD-10-CM

## 2018-02-16 DIAGNOSIS — M6281 Muscle weakness (generalized): Secondary | ICD-10-CM | POA: Diagnosis present

## 2018-02-16 DIAGNOSIS — R001 Bradycardia, unspecified: Secondary | ICD-10-CM | POA: Diagnosis present

## 2018-02-16 DIAGNOSIS — D709 Neutropenia, unspecified: Secondary | ICD-10-CM | POA: Diagnosis not present

## 2018-02-16 DIAGNOSIS — M6282 Rhabdomyolysis: Secondary | ICD-10-CM | POA: Diagnosis present

## 2018-02-16 DIAGNOSIS — E785 Hyperlipidemia, unspecified: Secondary | ICD-10-CM | POA: Diagnosis present

## 2018-02-16 DIAGNOSIS — Z800 Family history of malignant neoplasm of digestive organs: Secondary | ICD-10-CM | POA: Diagnosis not present

## 2018-02-16 DIAGNOSIS — R296 Repeated falls: Secondary | ICD-10-CM | POA: Diagnosis present

## 2018-02-16 DIAGNOSIS — M2550 Pain in unspecified joint: Secondary | ICD-10-CM | POA: Diagnosis not present

## 2018-02-16 DIAGNOSIS — D61818 Other pancytopenia: Secondary | ICD-10-CM | POA: Diagnosis present

## 2018-02-16 DIAGNOSIS — Z23 Encounter for immunization: Secondary | ICD-10-CM | POA: Diagnosis not present

## 2018-02-16 DIAGNOSIS — Z9181 History of falling: Secondary | ICD-10-CM | POA: Diagnosis not present

## 2018-02-16 DIAGNOSIS — Z85820 Personal history of malignant melanoma of skin: Secondary | ICD-10-CM

## 2018-02-16 DIAGNOSIS — E1149 Type 2 diabetes mellitus with other diabetic neurological complication: Secondary | ICD-10-CM | POA: Diagnosis present

## 2018-02-16 DIAGNOSIS — Z515 Encounter for palliative care: Secondary | ICD-10-CM | POA: Diagnosis not present

## 2018-02-16 DIAGNOSIS — D500 Iron deficiency anemia secondary to blood loss (chronic): Secondary | ICD-10-CM | POA: Diagnosis present

## 2018-02-16 DIAGNOSIS — T796XXA Traumatic ischemia of muscle, initial encounter: Secondary | ICD-10-CM

## 2018-02-16 DIAGNOSIS — G629 Polyneuropathy, unspecified: Secondary | ICD-10-CM | POA: Diagnosis present

## 2018-02-16 DIAGNOSIS — N400 Enlarged prostate without lower urinary tract symptoms: Secondary | ICD-10-CM | POA: Diagnosis present

## 2018-02-16 DIAGNOSIS — Z79899 Other long term (current) drug therapy: Secondary | ICD-10-CM | POA: Diagnosis not present

## 2018-02-16 DIAGNOSIS — R55 Syncope and collapse: Secondary | ICD-10-CM | POA: Diagnosis present

## 2018-02-16 DIAGNOSIS — L89154 Pressure ulcer of sacral region, stage 4: Secondary | ICD-10-CM | POA: Diagnosis present

## 2018-02-16 DIAGNOSIS — G9009 Other idiopathic peripheral autonomic neuropathy: Secondary | ICD-10-CM | POA: Diagnosis present

## 2018-02-16 DIAGNOSIS — J811 Chronic pulmonary edema: Secondary | ICD-10-CM | POA: Insufficient documentation

## 2018-02-16 DIAGNOSIS — C9500 Acute leukemia of unspecified cell type not having achieved remission: Principal | ICD-10-CM | POA: Diagnosis present

## 2018-02-16 DIAGNOSIS — I10 Essential (primary) hypertension: Secondary | ICD-10-CM | POA: Diagnosis present

## 2018-02-16 DIAGNOSIS — E870 Hyperosmolality and hypernatremia: Secondary | ICD-10-CM | POA: Diagnosis present

## 2018-02-16 DIAGNOSIS — Z7189 Other specified counseling: Secondary | ICD-10-CM

## 2018-02-16 DIAGNOSIS — D649 Anemia, unspecified: Secondary | ICD-10-CM

## 2018-02-16 DIAGNOSIS — S299XXA Unspecified injury of thorax, initial encounter: Secondary | ICD-10-CM | POA: Diagnosis not present

## 2018-02-16 DIAGNOSIS — Z66 Do not resuscitate: Secondary | ICD-10-CM | POA: Diagnosis not present

## 2018-02-16 DIAGNOSIS — R2689 Other abnormalities of gait and mobility: Secondary | ICD-10-CM | POA: Diagnosis present

## 2018-02-16 DIAGNOSIS — I5030 Unspecified diastolic (congestive) heart failure: Secondary | ICD-10-CM | POA: Diagnosis not present

## 2018-02-16 DIAGNOSIS — Z7401 Bed confinement status: Secondary | ICD-10-CM | POA: Diagnosis not present

## 2018-02-16 DIAGNOSIS — Z87891 Personal history of nicotine dependence: Secondary | ICD-10-CM

## 2018-02-16 DIAGNOSIS — I509 Heart failure, unspecified: Secondary | ICD-10-CM

## 2018-02-16 DIAGNOSIS — R5382 Chronic fatigue, unspecified: Secondary | ICD-10-CM | POA: Diagnosis present

## 2018-02-16 DIAGNOSIS — Z806 Family history of leukemia: Secondary | ICD-10-CM | POA: Diagnosis not present

## 2018-02-16 DIAGNOSIS — I959 Hypotension, unspecified: Secondary | ICD-10-CM | POA: Diagnosis not present

## 2018-02-16 DIAGNOSIS — G9341 Metabolic encephalopathy: Secondary | ICD-10-CM | POA: Diagnosis present

## 2018-02-16 DIAGNOSIS — R42 Dizziness and giddiness: Secondary | ICD-10-CM | POA: Diagnosis not present

## 2018-02-16 DIAGNOSIS — R351 Nocturia: Secondary | ICD-10-CM | POA: Diagnosis present

## 2018-02-16 DIAGNOSIS — R41841 Cognitive communication deficit: Secondary | ICD-10-CM | POA: Diagnosis present

## 2018-02-16 DIAGNOSIS — R7989 Other specified abnormal findings of blood chemistry: Secondary | ICD-10-CM

## 2018-02-16 DIAGNOSIS — E440 Moderate protein-calorie malnutrition: Secondary | ICD-10-CM | POA: Diagnosis present

## 2018-02-16 DIAGNOSIS — I2699 Other pulmonary embolism without acute cor pulmonale: Secondary | ICD-10-CM | POA: Diagnosis present

## 2018-02-16 DIAGNOSIS — R778 Other specified abnormalities of plasma proteins: Secondary | ICD-10-CM

## 2018-02-16 LAB — I-STAT TROPONIN, ED: Troponin i, poc: 0.09 ng/mL (ref 0.00–0.08)

## 2018-02-16 LAB — COMPREHENSIVE METABOLIC PANEL
ALK PHOS: 75 U/L (ref 38–126)
ALT: 33 U/L (ref 0–44)
AST: 101 U/L — ABNORMAL HIGH (ref 15–41)
Albumin: 3.4 g/dL — ABNORMAL LOW (ref 3.5–5.0)
Anion gap: 10 (ref 5–15)
BILIRUBIN TOTAL: 1.8 mg/dL — AB (ref 0.3–1.2)
BUN: 42 mg/dL — ABNORMAL HIGH (ref 8–23)
CALCIUM: 8.2 mg/dL — AB (ref 8.9–10.3)
CO2: 19 mmol/L — AB (ref 22–32)
CREATININE: 0.99 mg/dL (ref 0.61–1.24)
Chloride: 113 mmol/L — ABNORMAL HIGH (ref 98–111)
GFR calc Af Amer: >60 mL/min (ref >60)
GFR calc non Af Amer: >60 mL/min (ref >60)
Glucose, Bld: 113 mg/dL — ABNORMAL HIGH (ref 70–99)
Potassium: 4 mmol/L (ref 3.5–5.1)
SODIUM: 142 mmol/L (ref 135–145)
TOTAL PROTEIN: 5.7 g/dL — AB (ref 6.5–8.1)

## 2018-02-16 LAB — URINALYSIS, ROUTINE W REFLEX MICROSCOPIC
Bilirubin Urine: NEGATIVE
GLUCOSE, UA: NEGATIVE mg/dL
Ketones, ur: 5 mg/dL — AB
Leukocytes, UA: NEGATIVE
NITRITE: NEGATIVE
PH: 5 (ref 5.0–8.0)
Protein, ur: NEGATIVE mg/dL
SPECIFIC GRAVITY, URINE: 1.013 (ref 1.005–1.030)

## 2018-02-16 LAB — BRAIN NATRIURETIC PEPTIDE: B NATRIURETIC PEPTIDE 5: 388.8 pg/mL — AB (ref 0.0–100.0)

## 2018-02-16 LAB — VITAMIN B12: VITAMIN B 12: 518 pg/mL (ref 180–914)

## 2018-02-16 LAB — IRON AND TIBC
Iron: 238 ug/dL — ABNORMAL HIGH (ref 45–182)
SATURATION RATIOS: 93 % — AB (ref 17.9–39.5)
TIBC: 257 ug/dL (ref 250–450)
UIBC: 19 ug/dL

## 2018-02-16 LAB — PROTIME-INR
INR: 1.34
Prothrombin Time: 16.5 seconds — ABNORMAL HIGH (ref 11.4–15.2)

## 2018-02-16 LAB — CK: CK TOTAL: 2394 U/L — AB (ref 49–397)

## 2018-02-16 LAB — MRSA PCR SCREENING: MRSA by PCR: NEGATIVE

## 2018-02-16 LAB — ETHANOL

## 2018-02-16 LAB — POC OCCULT BLOOD, ED: FECAL OCCULT BLD: NEGATIVE

## 2018-02-16 LAB — FERRITIN: FERRITIN: 583 ng/mL — AB (ref 24–336)

## 2018-02-16 LAB — ACETAMINOPHEN LEVEL

## 2018-02-16 LAB — FOLATE: FOLATE: 13.4 ng/mL (ref >5.9)

## 2018-02-16 LAB — SALICYLATE LEVEL: Salicylate Lvl: <7 mg/dL (ref 2.8–30.0)

## 2018-02-16 LAB — PREPARE RBC (CROSSMATCH)

## 2018-02-16 LAB — LACTATE DEHYDROGENASE: LDH: 417 U/L — ABNORMAL HIGH (ref 98–192)

## 2018-02-16 MED ORDER — ACETAMINOPHEN 325 MG PO TABS
650.0000 mg | ORAL_TABLET | Freq: Four times a day (QID) | ORAL | Status: DC | PRN
  Filled 2018-02-16: qty 2

## 2018-02-16 MED ORDER — SODIUM CHLORIDE 0.9% IV SOLUTION: Freq: Once | INTRAVENOUS | Status: DC

## 2018-02-16 MED ORDER — FUROSEMIDE 10 MG/ML IJ SOLN
20.0000 mg | Freq: Once | INTRAMUSCULAR | Status: AC
  Administered 2018-02-16: 20 mg via INTRAVENOUS
  Filled 2018-02-16: qty 2

## 2018-02-16 MED ORDER — ONDANSETRON HCL 4 MG PO TABS: 4.0000 mg | ORAL_TABLET | Freq: Four times a day (QID) | ORAL | Status: DC | PRN

## 2018-02-16 MED ORDER — ACETAMINOPHEN 650 MG RE SUPP: 650.0000 mg | Freq: Four times a day (QID) | RECTAL | Status: DC | PRN

## 2018-02-16 MED ORDER — ONDANSETRON HCL 4 MG/2ML IJ SOLN: 4.0000 mg | Freq: Four times a day (QID) | INTRAMUSCULAR | Status: DC | PRN

## 2018-02-16 MED ORDER — AZITHROMYCIN 250 MG PO TABS
500.0000 mg | ORAL_TABLET | Freq: Once | ORAL | Status: AC
  Administered 2018-02-16: 500 mg via ORAL
  Filled 2018-02-16: qty 2

## 2018-02-16 MED ORDER — POLYETHYLENE GLYCOL 3350 17 G PO PACK: 17.0000 g | PACK | Freq: Every day | ORAL | Status: DC | PRN

## 2018-02-16 MED ORDER — SODIUM CHLORIDE 0.9 % IV SOLN
1.0000 g | Freq: Once | INTRAVENOUS | Status: AC
  Administered 2018-02-16: 1 g via INTRAVENOUS
  Filled 2018-02-16: qty 10

## 2018-02-16 MED ORDER — INFLUENZA VAC SPLIT HIGH-DOSE 0.5 ML IM SUSY
0.5000 mL | PREFILLED_SYRINGE | INTRAMUSCULAR | Status: AC
  Administered 2018-02-18: 0.5 mL via INTRAMUSCULAR
  Filled 2018-02-16: qty 0.5

## 2018-02-16 MED ORDER — SODIUM CHLORIDE 0.9 % IV SOLN
Freq: Once | INTRAVENOUS | Status: AC
  Administered 2018-02-16: 18:00:00 via INTRAVENOUS

## 2018-02-16 NOTE — H&P (Signed)
History and Physical    Jason Lane VFI:[id number] DOB: May 12, 1927 DOA: 02/28/2018  PCP: Biagio Borg, MD   Patient coming from: Home  I have personally briefly reviewed patient's old medical records in [place]  Chief Complaint: weakness  HPI: Jason Lane is a 84 y.o. male with medical history significant of  neuropathy presents with weakness and falls.  Over the past several weeks he has had increasing weakness.  He was in the floor almost all night.  He does have some anorexia.  He denies any medications intake.  Patient lives alone and has no family remaining.  States he has some shortness of breath denies any fever or cough.  He denies any hematochezia or hematuria.  He did have an office visit back in November of last year for some gross hematuria.  Patient was found to be pancytopenic.  Stool was Hemoccult negative.  Chest x-ray shows interstitial edema.  He denies any history of heart failure.  Head CT not acute.  ED Course: Patient was given IV Rocephin and Zithromax and IV fluids.  He was ordered 2 units of packed red blood cells for his hemoglobin of 4.  Review of Systems: admits to weakness, falls.  Denies abd pain nausea vomiting , hematochezia  positive for shortness of breath, he denies SI All others reviewed with patient  and are  negative unless otherwise stated   Past Medical History:  Diagnosis Date  . ANEMIA-IRON DEFICIENCY 07/08/2010  . ASTHMA 12/03/2009   States no history to asthma  . BENIGN PROSTATIC HYPERTROPHY 12/03/2009  . Cancer ([place])    skin cancer  . DIVERTICULOSIS, COLON 12/03/2009  . FATIGUE 12/03/2009  . GERD 12/03/2009  . HYPERLIPIDEMIA 12/03/2009  . HYPERTENSION 02/01/2010  . HYPERTENSION NEC 12/03/2009  . MALIGNANT MELANOMA, HX OF 12/03/2009  . SKIN LESION 12/03/2009  . TOBACCO USE, QUIT 12/03/2009  . TREMOR 12/03/2009    Past Surgical History:  Procedure Laterality Date  . COLONOSCOPY    . HERNIA REPAIR    . INGUINAL HERNIA REPAIR Left 06/24/2015     Procedure: LAPAROSCOPIC REPAIR RECURRENT LEFT INGUINAL HERNIA;  Surgeon: Greer Pickerel, MD;  Location: [facility];  Service: General;  Laterality: Left;  . inguinal herniorrhapy  1970   right  . INSERTION OF MESH Left 06/24/2015   Procedure: INSERTION OF MESH;  Surgeon: Greer Pickerel, MD;  Location: [facility];  Service: General;  Laterality: Left;  . s/p left leg melanoma       reports that he has quit smoking. He has never used smokeless tobacco. He reports that he drinks alcohol. He reports that he does not use drugs.  No Known Allergies  Family History  Problem Relation Age of Onset  . Colon cancer Father      Prior to Admission medications   Medication Sig Start Date End Date Taking? Authorizing Provider  cholecalciferol (VITAMIN D-400) 400 UNITS TABS Take 400 Units by mouth daily.    Yes [provider]  Cinnamon 500 MG capsule Take 500 mg by mouth daily.     Yes [provider]  co-enzyme Q-10 30 MG capsule Take 30 mg by mouth 3 (three) times daily.   Yes [provider]  gabapentin (NEURONTIN) 100 MG capsule Take 1 capsule (100 mg total) by mouth at bedtime. 12/09/16  Yes Lyndal Pulley, DO  meclizine (ANTIVERT) 12.5 MG tablet Take 1 tablet (12.5 mg total) by mouth 3 (three) times daily as needed for dizziness. 08/15/17  08/15/18 Yes Biagio Borg, MD  Multiple Vitamin (MULTIVITAMIN) capsule Take 1 capsule by mouth daily.     Yes [provider]  tamsulosin (FLOMAX) 0.4 MG CAPS capsule TAKE 1 CAPSULE BY MOUTH EVERY DAY Patient not taking: Reported on 03/02/2018 06/06/17   Biagio Borg, MD    Physical Exam: Vitals:   02/26/2018 2045 02/01/2018 2048 02/23/2018 2059 02/01/2018 2103  BP:  (!) 144/56  (!) 124/44  Pulse:  (!) 56 (!) 58 (!) 53  Resp:  19 19 14   Temp: 97.9 F (36.6 C) 97.9 F (36.6 C)  97.6 F (36.4 C)  TempSrc: Oral Oral  Oral  SpO2:  100% 100% 100%  Weight:   57 kg   Height:   5\' 8"  (1.727 m)     Constitutional: NAD, calm, comfortable,  pale, frail Vitals:   02/17/2018 2045 02/26/2018 2048 02/11/2018 2059 02/18/2018 2103  BP:  (!) 144/56  (!) 124/44  Pulse:  (!) 56 (!) 58 (!) 53  Resp:  19 19 14   Temp: 97.9 F (36.6 C) 97.9 F (36.6 C)  97.6 F (36.4 C)  TempSrc: Oral Oral  Oral  SpO2:  100% 100% 100%  Weight:   57 kg   Height:   5\' 8"  (1.727 m)    Eyes: PERRL, lids and pale conjunctivae normal.  Right ptosis patient states is chronic ENMT: Mucous membranes are dry . Posterior pharynx clear of any exudate or lesions. Neck: normal, supple, no masses,  Respiratory: clear to auscultation bilaterally,  Cardiovascular: Regular rate and rhythm, no murmurs / rubs / gallops. No extremity edema. 2+ pedal pulses. .  Abdomen: no tenderness, no masses palpated. No hepatosplenomegaly. Bowel sounds positive.  Musculoskeletal: no clubbing / cyanosis.  no contractures.  Decreased  muscle tone.  Skin: no rashes, lesions, ulcers. No induration Neurologic: Generally quite weak.  Lower extremities 3+ out of 5 strength upper extremity.   Psychiatric: Normal judgment and insight. Alert and oriented x 3. Normal mood.     Labs on Admission: I have personally reviewed following labs and imaging studies  CBC: Recent Labs  Lab 02/14/2018 1718  WBC 3.4*  NEUTROABS 0.3*  HGB 4.1*  HCT 13.1*  MCV 119.1*  PLT 42*   Basic Metabolic Panel: Recent Labs  Lab 02/15/2018 1637  NA 142  K 4.0  CL 113*  CO2 19*  GLUCOSE 113*  BUN 42*  CREATININE 0.99  CALCIUM 8.2*   GFR: Estimated Creatinine Clearance: 41.6 mL/min (by C-G formula based on SCr of 0.99 mg/dL). Liver Function Tests: Recent Labs  Lab 02/26/2018 1637  AST 101*  ALT 33  ALKPHOS 75  BILITOT 1.8*  PROT 5.7*  ALBUMIN 3.4*   No results for input(s): LIPASE, AMYLASE in the last 168 hours. No results for input(s): AMMONIA in the last 168 hours. Coagulation Profile: Recent Labs  Lab 02/06/2018 1637  INR 1.34   Cardiac Enzymes: Recent Labs  Lab 02/27/2018 1637  CKTOTAL  2,394*   BNP (last 3 results) No results for input(s): PROBNP in the last 8760 hours. HbA1C: No results for input(s): HGBA1C in the last 72 hours. CBG: No results for input(s): GLUCAP in the last 168 hours. Lipid Profile: No results for input(s): CHOL, HDL, LDLCALC, TRIG, CHOLHDL, LDLDIRECT in the last 72 hours. Thyroid Function Tests: No results for input(s): TSH, T4TOTAL, FREET4, T3FREE, THYROIDAB in the last 72 hours. Anemia Panel: No results for input(s): VITAMINB12, FOLATE, FERRITIN, TIBC, IRON, RETICCTPCT in the last 72  hours. Urine analysis:    Component Value Date/Time   COLORURINE YELLOW 02/27/2018 1754   APPEARANCEUR CLOUDY (A) 02/25/2018 1754   LABSPEC 1.013 02/22/2018 1754   PHURINE 5.0 02/08/2018 1754   GLUCOSEU NEGATIVE 02/04/2018 1754   GLUCOSEU NEGATIVE 03/31/2017 1000   HGBUR LARGE (A) 02/26/2018 1754   BILIRUBINUR NEGATIVE 02/15/2018 1754   KETONESUR 5 (A) 02/02/2018 1754   PROTEINUR NEGATIVE 02/18/2018 1754   UROBILINOGEN 0.2 03/31/2017 1000   NITRITE NEGATIVE 02/06/2018 1754   LEUKOCYTESUR NEGATIVE 02/10/2018 1754    Radiological Exams on Admission: Dg Chest 2 View  Result Date: 02/22/2018 CLINICAL DATA:  [place] today.  Generalized weakness. EXAM: CHEST - 2 VIEW COMPARISON:  06/23/2012 FINDINGS: Heart size upper limits of normal. Chronic aortic atherosclerosis. Bilateral pleural effusions layering dependently with dependent atelectasis. Mild interstitial edema. No acute bone finding. IMPRESSION: Mild interstitial edema. Small effusions layering dependently with dependent atelectasis. Findings most consistent with congestive heart failure. Could not rule out basilar pneumonia, but the findings may simply be due to effusion associated atelectasis. Electronically Signed   By: Nelson Chimes M.D.   On: 02/26/2018 16:13   Ct Head Wo Contrast  Result Date: 02/19/2018 CLINICAL DATA:  Vertigo.  Weakness. EXAM: CT HEAD WITHOUT CONTRAST TECHNIQUE: Contiguous axial  images were obtained from the base of the skull through the vertex without intravenous contrast. COMPARISON:  None. FINDINGS: Brain: No subdural, epidural, or subarachnoid hemorrhage. No mass effect or midline shift. Ventricles and sulci are prominent consistent volume loss. Cerebellum, brainstem, and basal cisterns are normal. Scattered white matter changes are noted. No acute cortical ischemia or infarct. Vascular: Calcified atherosclerosis in the intracranial carotids. Skull: Normal. Negative for fracture or focal lesion. Sinuses/Orbits: No acute finding. Other: None. IMPRESSION: No acute intracranial abnormalities. No cause for syncope identified. Electronically Signed   By: Dorise Bullion III M.D   On: 02/06/2018 17:07    EKG: Independently reviewed.  Sinus bradycardia 56 bpm  Assessment/Plan Principal Problem:   Pancytopenia (HCC) Active Problems:   Severe anemia   Rhabdomyolysis   Acute CHF (congestive heart failure) (HCC)   Elevated troponin mild    -? MDS, check iron studies, LDH, Haptoglobin, Peri Smear, TSH, recommend hematology oncology consultation -Transfuse 2 unit prbc, cBC in the a.m. -? Fluid on CXR so lasix between transfusion. Check BNP And Echo  -Elevated ck but nml Crt. No IVF due to above. monitor for now -Denies CP, EKG non acute. Cycle cardiac enzymes.    DVT prophylaxis: SCD Code Status: Full  Disposition Plan: SNF  Consults called: Heme/onc  DR Alveda Reasons  Admission status: IP SDU    Jovaughn Wojtaszek Johnson-Pitts MD [facility] Pager [phone]  If 7PM-7AM, please contact night-coverage [email or web address] Password [email or web address]  02/17/2018, 9:22 PM

## 2018-02-16 NOTE — ED Notes (Signed)
ED TO INPATIENT HANDOFF REPORT  Name/Age/Gender Jason Lane 84 y.o. male  Code Status   Home/SNF/Other Home  Chief Complaint knee pain   Level of Care/Admitting Diagnosis ED Disposition    ED Disposition Condition [facility]: [place] [[id number]]  Level of Care: Stepdown [14]  Admit to SDU based on following criteria: Hemodynamic compromise or significant risk of instability:  Patient requiring short term acute titration and management of vasoactive drips, and invasive monitoring (i.e., CVP and Arterial line).  Diagnosis: Severe anemia [[id number]]  Admitting Physician: Shelbie Proctor [[id number]]  Attending Physician: Shelbie Proctor [[id number]]  Estimated length of stay: past midnight tomorrow  Certification:: I certify this patient will need inpatient services for at least 2 midnights  PT Class (Do Not Modify): Inpatient [101]  PT Acc Code (Do Not Modify): Private [1]       Medical History Past Medical History:  Diagnosis Date  . ANEMIA-IRON DEFICIENCY 07/08/2010  . ASTHMA 12/03/2009   States no history to asthma  . BENIGN PROSTATIC HYPERTROPHY 12/03/2009  . Cancer ([place])    skin cancer  . DIVERTICULOSIS, COLON 12/03/2009  . FATIGUE 12/03/2009  . GERD 12/03/2009  . HYPERLIPIDEMIA 12/03/2009  . HYPERTENSION 02/01/2010  . HYPERTENSION NEC 12/03/2009  . MALIGNANT MELANOMA, HX OF 12/03/2009  . SKIN LESION 12/03/2009  . TOBACCO USE, QUIT 12/03/2009  . TREMOR 12/03/2009    Allergies No Known Allergies  IV Location/Drains/Wounds Patient Lines/Drains/Airways Status   Active Line/Drains/Airways    Name:   Placement date:   Placement time:   Site:   Days:   Peripheral IV 02/01/2018 Right Antecubital   02/20/2018    1400    Antecubital   less than 1          Labs/Imaging Results for orders placed or performed during the hospital encounter of 02/08/2018 (from the past 48 hour(s))  I-stat troponin, ED     Status: Abnormal   Collection Time:  02/11/2018  4:27 PM  Result Value Ref Range   Troponin i, poc 0.09 (HH) 0.00 - 0.08 ng/mL   Comment NOTIFIED PHYSICIAN    Comment 3            Comment: Due to the release kinetics of cTnI, a negative result within the first hours of the onset of symptoms does not rule out myocardial infarction with certainty. If myocardial infarction is still suspected, repeat the test at appropriate intervals.   Comprehensive metabolic panel     Status: Abnormal   Collection Time: 02/23/2018  4:37 PM  Result Value Ref Range   Sodium 142 135 - 145 mmol/L   Potassium 4.0 3.5 - 5.1 mmol/L   Chloride 113 (H) 98 - 111 mmol/L   CO2 19 (L) 22 - 32 mmol/L   Glucose, Bld 113 (H) 70 - 99 mg/dL   BUN 42 (H) 8 - 23 mg/dL   Creatinine, Ser 0.99 0.61 - 1.24 mg/dL   Calcium 8.2 (L) 8.9 - 10.3 mg/dL   Total Protein 5.7 (L) 6.5 - 8.1 g/dL   Albumin 3.4 (L) 3.5 - 5.0 g/dL   AST 101 (H) 15 - 41 U/L   ALT 33 0 - 44 U/L   Alkaline Phosphatase 75 38 - 126 U/L   Total Bilirubin 1.8 (H) 0.3 - 1.2 mg/dL   GFR calc non Af Amer >60 >60 mL/min   GFR calc Af Amer >60 >60 mL/min    Comment: (NOTE) The eGFR has been  calculated using the CKD EPI equation. This calculation has not been validated in all clinical situations. eGFR's persistently <60 mL/min signify possible Chronic Kidney Disease.    Anion gap 10 5 - 15    Comment: Performed at [facility], [place] [address], [place], [place] [address]  Protime-INR     Status: Abnormal   Collection Time: 02/25/2018  4:37 PM  Result Value Ref Range   Prothrombin Time 16.5 (H) 11.4 - 15.2 seconds   INR 1.34     Comment: Performed at [facility], [place] [address], [place], [place] [address]  CK     Status: Abnormal   Collection Time: 01/31/2018  4:37 PM  Result Value Ref Range   Total CK 2,394 (H) 49 - 397 U/L    Comment: Performed at [facility], [place] [address] [place], [place] [address]  Acetaminophen level     Status:  Abnormal   Collection Time: 02/11/2018  4:38 PM  Result Value Ref Range   Acetaminophen (Tylenol), Serum <10 (L) 10 - 30 ug/mL    Comment: (NOTE) Therapeutic concentrations vary significantly. A range of 10-30 ug/mL  may be an effective concentration for many patients. However, some  are best treated at concentrations outside of this range. Acetaminophen concentrations >150 ug/mL at 4 hours after ingestion  and >50 ug/mL at 12 hours after ingestion are often associated with  toxic reactions. Performed at [facility], [place] [address] [place], [place] [address]   Salicylate level     Status: None   Collection Time: 02/03/2018  4:38 PM  Result Value Ref Range   Salicylate Lvl <8.5 2.8 - 30.0 mg/dL    Comment: Performed at [facility], [place] [address] [place], [place] [address]  Ethanol     Status: None   Collection Time: 02/14/2018  4:38 PM  Result Value Ref Range   Alcohol, Ethyl (B) <10 <10 mg/dL    Comment: (NOTE) Lowest detectable limit for serum alcohol is 10 mg/dL. For medical purposes only. Performed at [facility], [place] [address], [place], [place] [address]   CBC with Differential/Platelet     Status: Abnormal   Collection Time: 02/25/2018  5:18 PM  Result Value Ref Range   WBC 3.4 (L) 4.0 - 10.5 K/uL   RBC 1.10 (L) 4.22 - 5.81 MIL/uL   Hemoglobin 4.1 (LL) 13.0 - 17.0 g/dL    Comment: REPEATED TO VERIFY THIS RESULT HAS BEEN CALLED TO SIMPSON, C RN BY MARINDA BLACK ON 10 17 2019 AT 1819, AND HAS BEEN READ BACK. CRITICAL RESULT VERIFIED    HCT 13.1 (L) 39.0 - 52.0 %   MCV 119.1 (H) 80.0 - 100.0 fL   MCH 37.3 (H) 26.0 - 34.0 pg   MCHC 31.3 30.0 - 36.0 g/dL   RDW 21.6 (H) 11.5 - 15.5 %   Platelets 42 (L) 150 - 400 K/uL    Comment: REPEATED TO VERIFY PLATELET COUNT CONFIRMED BY SMEAR Immature Platelet Fraction may be clinically indicated, consider ordering this additional test [id number]    nRBC 1.2 (H) 0.0 - 0.2 %    Neutrophils Relative % 7 %   Neutro Abs 0.3 (L) 1.7 - 7.7 K/uL    Comment: This result has been called to SIMPSON, C RN by Karin Golden on 10 17 2019 at 1819, and has been read back. CRITICAL RESULT VERIFIED   Lymphocytes Relative 58 %   Lymphs Abs 2.0  0.7 - 4.0 K/uL   Monocytes Relative 33 %   Monocytes Absolute 1.1 (H) 0.1 - 1.0 K/uL   Eosinophils Relative 0 %   Eosinophils Absolute 0.0 0.0 - 0.5 K/uL   Basophils Relative 0 %   Basophils Absolute 0.0 0.0 - 0.1 K/uL   Immature Granulocytes 2 %   Abs Immature Granulocytes 0.05 0.00 - 0.07 K/uL    Comment: Performed at [facility], [place] [address], [place], [place] [address]  Urinalysis, Routine w reflex microscopic     Status: Abnormal   Collection Time: 02/19/2018  5:54 PM  Result Value Ref Range   Color, Urine YELLOW YELLOW   APPearance CLOUDY (A) CLEAR   Specific Gravity, Urine 1.013 1.005 - 1.030   pH 5.0 5.0 - 8.0   Glucose, UA NEGATIVE NEGATIVE mg/dL   Hgb urine dipstick LARGE (A) NEGATIVE   Bilirubin Urine NEGATIVE NEGATIVE   Ketones, ur 5 (A) NEGATIVE mg/dL   Protein, ur NEGATIVE NEGATIVE mg/dL   Nitrite NEGATIVE NEGATIVE   Leukocytes, UA NEGATIVE NEGATIVE   RBC / HPF 0-5 0 - 5 RBC/hpf   WBC, UA 0-5 0 - 5 WBC/hpf   Bacteria, UA RARE (A) NONE SEEN   Squamous Epithelial / LPF 0-5 0 - 5   Mucus PRESENT     Comment: Performed at [facility], [place] [address], [place], [place] [address]  POC occult blood, ED RN will collect     Status: None   Collection Time: 02/21/2018  6:15 PM  Result Value Ref Range   Fecal Occult Bld NEGATIVE NEGATIVE   Dg Chest 2 View  Result Date: 02/15/2018 CLINICAL DATA:  [place] today.  Generalized weakness. EXAM: CHEST - 2 VIEW COMPARISON:  06/23/2012 FINDINGS: Heart size upper limits of normal. Chronic aortic atherosclerosis. Bilateral pleural effusions layering dependently with dependent atelectasis. Mild interstitial edema. No acute bone finding. IMPRESSION:  Mild interstitial edema. Small effusions layering dependently with dependent atelectasis. Findings most consistent with congestive heart failure. Could not rule out basilar pneumonia, but the findings may simply be due to effusion associated atelectasis. Electronically Signed   By: Nelson Chimes M.D.   On: 02/20/2018 16:13   Ct Head Wo Contrast  Result Date: 02/20/2018 CLINICAL DATA:  Vertigo.  Weakness. EXAM: CT HEAD WITHOUT CONTRAST TECHNIQUE: Contiguous axial images were obtained from the base of the skull through the vertex without intravenous contrast. COMPARISON:  None. FINDINGS: Brain: No subdural, epidural, or subarachnoid hemorrhage. No mass effect or midline shift. Ventricles and sulci are prominent consistent volume loss. Cerebellum, brainstem, and basal cisterns are normal. Scattered white matter changes are noted. No acute cortical ischemia or infarct. Vascular: Calcified atherosclerosis in the intracranial carotids. Skull: Normal. Negative for fracture or focal lesion. Sinuses/Orbits: No acute finding. Other: None. IMPRESSION: No acute intracranial abnormalities. No cause for syncope identified. Electronically Signed   By: Dorise Bullion III M.D   On: 02/18/2018 17:07   EKG Interpretation  Date/Time:  Thursday February 16 2018 16:08:35 EDT Ventricular Rate:  56 PR Interval:    QRS Duration: 102 QT Interval:  469 QTC Calculation: 453 R Axis:   56 Text Interpretation:  Sinus rhythm Confirmed by Quintella Reichert [phone]) on 02/25/2018 4:19:50 PM   Pending Labs Unresulted Labs (From admission, onward)    Start     Ordered   02/14/2018 1851  Troponin T  Now then every 3 hours,   R     02/17/2018 1850   02/15/2018  [place]  Pathologist smear review  Once,   R    Comments:  Peri smearpancytopenia    02/12/2018 1845   02/09/2018 1843  Iron and TIBC  STAT,   R     02/03/2018 1843   02/19/2018 1843  Ferritin  STAT,   R     02/27/2018 1843   02/18/2018 1843  Vitamin B12  STAT,   R     02/14/2018 1843    02/01/2018 1843  Folate  STAT,   R     02/26/2018 1843   02/09/2018 1843  Lactate dehydrogenase  STAT,   R     02/15/2018 1843   02/02/2018 1843  Haptoglobin  STAT,   R     03/02/2018 1843   02/13/2018 1819  Type and screen [place]  Once,   STAT    Comments:  [place]    02/27/2018 1818   02/18/2018 1819  Prepare RBC  (Adult Blood Administration - Red Blood Cells)  Once,   R    Question Answer Comment  # of Units 2 units   Transfusion Indications Symptomatic Anemia   If emergent release call blood bank Not emergent release      01/31/2018 1818   02/14/2018 1721  Brain natriuretic peptide  Once,   STAT     02/06/2018 1720   02/21/2018 1525  Urine culture  STAT,   STAT     02/07/2018 1525   02/18/2018 1524  CBC WITH DIFFERENTIAL  STAT,   STAT     02/24/2018 1525          Vitals/Pain Today's Vitals   02/02/2018 1611 02/17/2018 1615 02/01/2018 1829 02/13/2018 1858  BP: 128/60 (!) 127/56 127/67 (!) 141/63  Pulse: (!) 55 (!) 56 (!) 56 61  Resp: 18 17 14  (!) 27  Temp:      TempSrc:      SpO2: 99% 96% 100% 97%  PainSc:        Isolation Precautions No active isolations  Medications Medications  0.9 %  sodium chloride infusion (Manually program via Guardrails IV Fluids) (has no administration in time range)  Influenza vac split quadrivalent PF (FLUZONE HIGH-DOSE) injection 0.5 mL (has no administration in time range)  furosemide (LASIX) injection 20 mg (has no administration in time range)  0.9 %  sodium chloride infusion ( Intravenous New Bag/Given 02/27/2018 1738)  cefTRIAXone (ROCEPHIN) 1 g in sodium chloride 0.9 % 100 mL IVPB (1 g Intravenous New Bag/Given 02/27/2018 1743)  azithromycin (ZITHROMAX) tablet 500 mg (500 mg Oral Given 03/02/2018 1740)    Mobility walks with device

## 2018-02-16 NOTE — ED Notes (Signed)
Bed: IP18 Expected date:  Expected time:  Means of arrival:  Comments: EMS 84yo weakness

## 2018-02-16 NOTE — ED Triage Notes (Addendum)
Per EMS pt fall today with abrasions to bilateral knee (called EMS earlier in the day for same but was not transported); stated pain of 4/10; denies neck or back pain; alert and oriented to person, place, and situation but disoriented to time (stated it was 1999). Pt smells strongly of urine. Verbalized with EMS dizziness for 2 days and generalized weakness for 5 hours. EMS continues to report "he says he crawls around a lot." Pt lives at home by self. Concern for FTT.  Denies CP, SOB, LOC, or n/v/d but "has to pee often."

## 2018-02-16 NOTE — ED Notes (Signed)
Patient transported to CT 

## 2018-02-16 NOTE — ED Provider Notes (Signed)
Jason Lane Provider Note   CSN: [id number] Arrival date & time: 02/19/2018  1345     History   Chief Complaint Chief Complaint  Patient presents with  . Fall    HPI Jason Lane is a 84 y.o. male.  The history is provided by the patient and the EMS personnel. No language interpreter was used.  Fall    Jason Lane is a 84 y.o. male who presents to the Emergency Department complaining of fall, weakness. He presents to the emergency department via EMS for evaluation of weakness and fall. He lives at home alone and states that over the last three weeks he has experienced significant worsening in his weakness. He has difficulty with walking and has had frequent falls. He often has to crawl on his knees. He spent the night on the ground last night. He denies any fevers, chest pain, shortness of breath, nausea, vomiting, belly pain, hematochezia, melena. He has no medical problems and takes no medications. He has a remote history of melanoma 20 years ago. Per EMS report there was a note on the counter stating what he wanted done after he dies. Upon questioning patient states that he does not want to die but at one point he had lost hope. He denies any suicidal thoughts or suicide attempt. Denies tobacco, alcohol, drug use. Patient states there is no family to contact. Past Medical History:  Diagnosis Date  . ANEMIA-IRON DEFICIENCY 07/08/2010  . ASTHMA 12/03/2009   States no history to asthma  . BENIGN PROSTATIC HYPERTROPHY 12/03/2009  . Cancer ([place])    skin cancer  . DIVERTICULOSIS, COLON 12/03/2009  . FATIGUE 12/03/2009  . GERD 12/03/2009  . HYPERLIPIDEMIA 12/03/2009  . HYPERTENSION 02/01/2010  . HYPERTENSION NEC 12/03/2009  . MALIGNANT MELANOMA, HX OF 12/03/2009  . SKIN LESION 12/03/2009  . TOBACCO USE, QUIT 12/03/2009  . TREMOR 12/03/2009    Patient Active Problem List   Diagnosis Date Noted  . Pancytopenia ([place]) 02/04/2018  . Severe anemia 01/31/2018  .  Rhabdomyolysis 02/13/2018  . Acute CHF (congestive heart failure) ([place]) 03/02/2018  . Elevated troponin mild 02/13/2018  . Gait disorder 08/01/2017  . BPPV (benign paroxysmal positional vertigo) 08/01/2017  . Gross hematuria 03/30/2017  . BRBPR (bright red blood per rectum) 03/30/2017  . Rotator cuff tear arthropathy of both shoulders 11/30/2016  . Hyperglycemia 06/29/2016  . S/P laparoscopic hernia repair 06/24/2015  . Inguinal hernia, left 05/27/2015  . Skin lesion 05/27/2015  . Bradycardia 12/24/2014  . Chest pain 06/23/2012  . Peripheral neuropathy 12/19/2011  . Hematochezia 12/15/2011  . Leg cramps 12/15/2011  . Bladder neck obstruction 12/15/2011  . Vertigo 09/11/2011  . Preventative health care 09/09/2010  . ANEMIA-IRON DEFICIENCY 07/08/2010  . Essential hypertension 02/01/2010  . Hyperlipidemia 12/03/2009  . Asthma 12/03/2009  . GERD 12/03/2009  . DIVERTICULOSIS, COLON 12/03/2009  . BPH associated with nocturia 12/03/2009  . FATIGUE 12/03/2009  . TREMOR 12/03/2009  . MALIGNANT MELANOMA, HX OF 12/03/2009  . TOBACCO USE, QUIT 12/03/2009    Past Surgical History:  Procedure Laterality Date  . COLONOSCOPY    . HERNIA REPAIR    . INGUINAL HERNIA REPAIR Left 06/24/2015   Procedure: LAPAROSCOPIC REPAIR RECURRENT LEFT INGUINAL HERNIA;  Surgeon: Greer Pickerel, MD;  Location: [facility];  Service: General;  Laterality: Left;  . inguinal herniorrhapy  1970   right  . INSERTION OF MESH Left 06/24/2015   Procedure: INSERTION OF MESH;  Surgeon: Greer Pickerel, MD;  Location: [facility];  Service: General;  Laterality: Left;  . s/p left leg melanoma          Home Medications    Prior to Admission medications   Medication Sig Start Date End Date Taking? Authorizing Provider  cholecalciferol (VITAMIN D-400) 400 UNITS TABS Take 400 Units by mouth daily.    Yes [provider]  Cinnamon 500 MG capsule Take 500 mg by mouth daily.     Yes [provider]  co-enzyme Q-10 30  MG capsule Take 30 mg by mouth 3 (three) times daily.   Yes [provider]  gabapentin (NEURONTIN) 100 MG capsule Take 1 capsule (100 mg total) by mouth at bedtime. 12/09/16  Yes Lyndal Pulley, DO  meclizine (ANTIVERT) 12.5 MG tablet Take 1 tablet (12.5 mg total) by mouth 3 (three) times daily as needed for dizziness. 08/15/17 08/15/18 Yes Biagio Borg, MD  Multiple Vitamin (MULTIVITAMIN) capsule Take 1 capsule by mouth daily.     Yes [provider]  tamsulosin (FLOMAX) 0.4 MG CAPS capsule TAKE 1 CAPSULE BY MOUTH EVERY DAY Patient not taking: Reported on 02/09/2018 06/06/17   Biagio Borg, MD    Family History Family History  Problem Relation Age of Onset  . Colon cancer Father     Social History Social History   Tobacco Use  . Smoking status: Former Research scientist (life sciences)  . Smokeless tobacco: Never Used  . Tobacco comment: none since approx 1985  Substance Use Topics  . Alcohol use: Yes    Comment: occ beer   . Drug use: No     Allergies   Patient has no known allergies.   Review of Systems Review of Systems  All other systems reviewed and are negative.    Physical Exam Updated Vital Signs BP (!) 119/59   Pulse (!) 58   Temp 97.7 F (36.5 C) (Oral)   Resp (!) 25   Ht 5\' 8"  (1.727 m)   Wt 57 kg   SpO2 100%   BMI 19.11 kg/m   Physical Exam  Constitutional: He is oriented to person, place, and time. He appears well-developed and well-nourished.  HENT:  Head: Normocephalic and atraumatic.  Cardiovascular: Normal rate and regular rhythm.  No murmur heard. Pulmonary/Chest: Effort normal. No respiratory distress.  Crackles in the left lung base  Abdominal: Soft. There is no tenderness. There is no rebound and no guarding.  Musculoskeletal: He exhibits no edema or tenderness.  Neurological: He is alert and oriented to person, place, and time.  dysconjugate gaze. Decreased vision in the right upper and right lower visual fields. There is an intention tremor  on the right upper extremity. 4/5 strength in the right upper extremity, 3/5 strength in the right lower extremity.  Skin: Skin is warm and dry. There is pallor.  Psychiatric: He has a normal mood and affect. His behavior is normal.  Nursing note and vitals reviewed.    ED Treatments / Results  Labs (all labs ordered are listed, but only abnormal results are displayed) Labs Reviewed  URINALYSIS, ROUTINE W REFLEX MICROSCOPIC - Abnormal; Notable for the following components:      Result Value   APPearance CLOUDY (*)    Hgb urine dipstick LARGE (*)    Ketones, ur 5 (*)    Bacteria, UA RARE (*)    All other components within normal limits  COMPREHENSIVE METABOLIC PANEL - Abnormal; Notable for the following components:   Chloride 113 (*)  CO2 19 (*)    Glucose, Bld 113 (*)    BUN 42 (*)    Calcium 8.2 (*)    Total Protein 5.7 (*)    Albumin 3.4 (*)    AST 101 (*)    Total Bilirubin 1.8 (*)    All other components within normal limits  PROTIME-INR - Abnormal; Notable for the following components:   Prothrombin Time 16.5 (*)    All other components within normal limits  CK - Abnormal; Notable for the following components:   Total CK 2,394 (*)    All other components within normal limits  ACETAMINOPHEN LEVEL - Abnormal; Notable for the following components:   Acetaminophen (Tylenol), Serum <10 (*)    All other components within normal limits  CBC WITH DIFFERENTIAL/PLATELET - Abnormal; Notable for the following components:   WBC 3.4 (*)    RBC 1.10 (*)    Hemoglobin 4.1 (*)    HCT 13.1 (*)    MCV 119.1 (*)    MCH 37.3 (*)    RDW 21.6 (*)    Platelets 42 (*)    nRBC 1.2 (*)    Neutro Abs 0.3 (*)    Monocytes Absolute 1.1 (*)    All other components within normal limits  BRAIN NATRIURETIC PEPTIDE - Abnormal; Notable for the following components:   B Natriuretic Peptide 388.8 (*)    All other components within normal limits  IRON AND TIBC - Abnormal; Notable for the  following components:   Iron 238 (*)    Saturation Ratios 93 (*)    All other components within normal limits  FERRITIN - Abnormal; Notable for the following components:   Ferritin 583 (*)    All other components within normal limits  LACTATE DEHYDROGENASE - Abnormal; Notable for the following components:   LDH 417 (*)    All other components within normal limits  I-STAT TROPONIN, ED - Abnormal; Notable for the following components:   Troponin i, poc 0.09 (*)    All other components within normal limits  MRSA PCR SCREENING  URINE CULTURE  SALICYLATE LEVEL  ETHANOL  VITAMIN B12  FOLATE  CBC WITH DIFFERENTIAL/PLATELET  HAPTOGLOBIN  TROPONIN T  TROPONIN T  PATHOLOGIST SMEAR REVIEW  TSH  CBC  COMPREHENSIVE METABOLIC PANEL  RETICULOCYTES  I-STAT CHEM 8, ED  POC OCCULT BLOOD, ED  TYPE AND SCREEN  PREPARE RBC (CROSSMATCH)  ABO/RH    EKG EKG Interpretation  Date/Time:  Thursday February 16 2018 16:08:35 EDT Ventricular Rate:  56 PR Interval:    QRS Duration: 102 QT Interval:  469 QTC Calculation: 453 R Axis:   56 Text Interpretation:  Sinus rhythm Confirmed by Quintella Reichert [phone]) on 02/10/2018 4:19:50 PM   Radiology Dg Chest 2 View  Result Date: 02/17/2018 CLINICAL DATA:  [place] today.  Generalized weakness. EXAM: CHEST - 2 VIEW COMPARISON:  06/23/2012 FINDINGS: Heart size upper limits of normal. Chronic aortic atherosclerosis. Bilateral pleural effusions layering dependently with dependent atelectasis. Mild interstitial edema. No acute bone finding. IMPRESSION: Mild interstitial edema. Small effusions layering dependently with dependent atelectasis. Findings most consistent with congestive heart failure. Could not rule out basilar pneumonia, but the findings may simply be due to effusion associated atelectasis. Electronically Signed   By: Nelson Chimes M.D.   On: 02/10/2018 16:13   Ct Head Wo Contrast  Result Date: 02/13/2018 CLINICAL DATA:  Vertigo.  Weakness. EXAM:  CT HEAD WITHOUT CONTRAST TECHNIQUE: Contiguous axial images were obtained from the base  of the skull through the vertex without intravenous contrast. COMPARISON:  None. FINDINGS: Brain: No subdural, epidural, or subarachnoid hemorrhage. No mass effect or midline shift. Ventricles and sulci are prominent consistent volume loss. Cerebellum, brainstem, and basal cisterns are normal. Scattered white matter changes are noted. No acute cortical ischemia or infarct. Vascular: Calcified atherosclerosis in the intracranial carotids. Skull: Normal. Negative for fracture or focal lesion. Sinuses/Orbits: No acute finding. Other: None. IMPRESSION: No acute intracranial abnormalities. No cause for syncope identified. Electronically Signed   By: Dorise Bullion III M.D   On: 02/25/2018 17:07    Procedures Procedures (including critical care time) CRITICAL CARE Performed by: Quintella Reichert   Total critical care time: 35 minutes  Critical care time was exclusive of separately billable procedures and treating other patients.  Critical care was necessary to treat or prevent imminent or life-threatening deterioration.  Critical care was time spent personally by me on the following activities: development of treatment plan with patient and/or surrogate as well as nursing, discussions with consultants, evaluation of patient's response to treatment, examination of patient, obtaining history from patient or surrogate, ordering and performing treatments and interventions, ordering and review of laboratory studies, ordering and review of radiographic studies, pulse oximetry and re-evaluation of patient's condition.  Medications Ordered in ED Medications  0.9 %  sodium chloride infusion (Manually program via Guardrails IV Fluids) (has no administration in time range)  Influenza vac split quadrivalent PF (FLUZONE HIGH-DOSE) injection 0.5 mL (has no administration in time range)  acetaminophen (TYLENOL) tablet 650 mg (has  no administration in time range)    Or  acetaminophen (TYLENOL) suppository 650 mg (has no administration in time range)  polyethylene glycol (MIRALAX / GLYCOLAX) packet 17 g (has no administration in time range)  ondansetron (ZOFRAN) tablet 4 mg (has no administration in time range)    Or  ondansetron (ZOFRAN) injection 4 mg (has no administration in time range)  0.9 %  sodium chloride infusion ( Intravenous New Bag/Given 02/21/2018 1738)  cefTRIAXone (ROCEPHIN) 1 g in sodium chloride 0.9 % 100 mL IVPB (1 g Intravenous New Bag/Given 02/02/2018 1743)  azithromycin (ZITHROMAX) tablet 500 mg (500 mg Oral Given 02/10/2018 1740)  furosemide (LASIX) injection 20 mg (20 mg Intravenous Given 02/24/2018 2336)     Initial Impression / Assessment and Plan / ED Course  I have reviewed the triage vital signs and the nursing notes.  Pertinent labs & imaging results that were available during my care of the patient were reviewed by me and considered in my medical decision making (see chart for details).     Patient here for evaluation of three weeks of progressive weakness, gait difficulties. He is pale and weak on examination. Labs demonstrate elevation in CK consistent with rhabdomyolysis. His troponin is mildly elevated, EKG without acute ischemic changes. His hemoglobin is found to be four, no evidence of active G.I. bleed. Concern for possible myelodysplasia. Discussed with patient findings of studies and critical nature of his illness, recommendation for a blood transfusion and admission and he is in agreement with treatment plan. Hospitalist consulted for admission.  Final Clinical Impressions(s) / ED Diagnoses   Final diagnoses:  None    ED Discharge Orders    None       Quintella Reichert, MD 02/17/18 [phone]

## 2018-02-17 ENCOUNTER — Inpatient Hospital Stay (HOSPITAL_COMMUNITY): Payer: Medicare Other

## 2018-02-17 DIAGNOSIS — Z9181 History of falling: Secondary | ICD-10-CM

## 2018-02-17 DIAGNOSIS — I5030 Unspecified diastolic (congestive) heart failure: Secondary | ICD-10-CM

## 2018-02-17 DIAGNOSIS — R531 Weakness: Secondary | ICD-10-CM

## 2018-02-17 DIAGNOSIS — D709 Neutropenia, unspecified: Secondary | ICD-10-CM

## 2018-02-17 DIAGNOSIS — D61818 Other pancytopenia: Secondary | ICD-10-CM

## 2018-02-17 DIAGNOSIS — Z806 Family history of leukemia: Secondary | ICD-10-CM

## 2018-02-17 DIAGNOSIS — G629 Polyneuropathy, unspecified: Secondary | ICD-10-CM

## 2018-02-17 LAB — CBC
HCT: 20 % — ABNORMAL LOW (ref 39.0–52.0)
HEMATOCRIT: 27.7 % — AB (ref 39.0–52.0)
Hemoglobin: 6.2 g/dL — CL (ref 13.0–17.0)
Hemoglobin: 9 g/dL — ABNORMAL LOW (ref 13.0–17.0)
MCH: 32.6 pg (ref 26.0–34.0)
MCH: 32.7 pg (ref 26.0–34.0)
MCHC: 31 g/dL (ref 30.0–36.0)
MCHC: 32.5 g/dL (ref 30.0–36.0)
MCV: 105.3 fL — ABNORMAL HIGH (ref 80.0–100.0)
MCV: 100.7 fL — ABNORMAL HIGH (ref 80.0–100.0)
NRBC: 1.1 % — AB (ref 0.0–0.2)
NRBC: 1.3 % — AB (ref 0.0–0.2)
PLATELETS: 40 10*3/uL — AB (ref 150–400)
Platelets: 42 10*3/uL — ABNORMAL LOW (ref 150–400)
RBC: 1.9 MIL/uL — AB (ref 4.22–5.81)
RBC: 2.75 MIL/uL — ABNORMAL LOW (ref 4.22–5.81)
RDW: 23.7 % — ABNORMAL HIGH (ref 11.5–15.5)
RDW: 21.1 % — AB (ref 11.5–15.5)
WBC: 3.5 10*3/uL — ABNORMAL LOW (ref 4.0–10.5)
WBC: 4 10*3/uL (ref 4.0–10.5)

## 2018-02-17 LAB — RETICULOCYTES
Immature Retic Fract: 20.2 % — ABNORMAL HIGH (ref 2.3–15.9)
RBC.: 1.89 MIL/uL — AB (ref 4.22–5.81)
RETIC COUNT ABSOLUTE: 33.5 10*3/uL (ref 19.0–186.0)
Retic Ct Pct: 1.8 % (ref 0.4–3.1)

## 2018-02-17 LAB — COMPREHENSIVE METABOLIC PANEL
ALT: 31 U/L (ref 0–44)
AST: 90 U/L — ABNORMAL HIGH (ref 15–41)
Albumin: 3.2 g/dL — ABNORMAL LOW (ref 3.5–5.0)
Alkaline Phosphatase: 71 U/L (ref 38–126)
Anion gap: 16 — ABNORMAL HIGH (ref 5–15)
BUN: 37 mg/dL — ABNORMAL HIGH (ref 8–23)
CHLORIDE: 112 mmol/L — AB (ref 98–111)
CO2: 19 mmol/L — AB (ref 22–32)
Calcium: 8.2 mg/dL — ABNORMAL LOW (ref 8.9–10.3)
Creatinine, Ser: 1.07 mg/dL (ref 0.61–1.24)
GFR calc Af Amer: >60 mL/min (ref >60)
GFR calc non Af Amer: 60 mL/min — ABNORMAL LOW (ref >60)
Glucose, Bld: 79 mg/dL (ref 70–99)
Potassium: 3.2 mmol/L — ABNORMAL LOW (ref 3.5–5.1)
SODIUM: 147 mmol/L — AB (ref 135–145)
Total Bilirubin: 1.6 mg/dL — ABNORMAL HIGH (ref 0.3–1.2)
Total Protein: 5.6 g/dL — ABNORMAL LOW (ref 6.5–8.1)

## 2018-02-17 LAB — BILIRUBIN, FRACTIONATED(TOT/DIR/INDIR)
BILIRUBIN TOTAL: 1.4 mg/dL — AB (ref 0.3–1.2)
Bilirubin, Direct: 0.3 mg/dL — ABNORMAL HIGH (ref 0.0–0.2)
Indirect Bilirubin: 1.1 mg/dL — ABNORMAL HIGH (ref 0.3–0.9)

## 2018-02-17 LAB — ECHOCARDIOGRAM COMPLETE
HEIGHTINCHES: 68 in
Weight: 2010.6 oz

## 2018-02-17 LAB — TSH: TSH: 3.828 u[IU]/mL (ref 0.350–4.500)

## 2018-02-17 LAB — ABO/RH: ABO/RH(D): AB POS

## 2018-02-17 LAB — PATHOLOGIST SMEAR REVIEW

## 2018-02-17 LAB — TROPONIN I
TROPONIN I: 0.03 ng/mL — AB (ref <0.03)
TROPONIN I: 0.03 ng/mL — AB (ref <0.03)

## 2018-02-17 LAB — PREPARE RBC (CROSSMATCH)

## 2018-02-17 MED ORDER — HALOPERIDOL LACTATE 5 MG/ML IJ SOLN
2.5000 mg | Freq: Once | INTRAMUSCULAR | Status: AC
  Administered 2018-02-17: 2.5 mg via INTRAVENOUS
  Filled 2018-02-17: qty 1

## 2018-02-17 MED ORDER — SODIUM CHLORIDE 0.9% IV SOLUTION: Freq: Once | INTRAVENOUS | Status: DC

## 2018-02-17 MED ORDER — FUROSEMIDE 10 MG/ML IJ SOLN
20.0000 mg | Freq: Once | INTRAMUSCULAR | Status: AC
  Administered 2018-02-17: 20 mg via INTRAVENOUS
  Filled 2018-02-17: qty 2

## 2018-02-17 NOTE — Progress Notes (Signed)
PROGRESS NOTE    Jason Lane  GLO:[id number] DOB: 1927/06/19 DOA: 02/18/2018 PCP: Biagio Borg, MD   Brief Narrative: Jason Lane is a 84 y.o. male with a history of neuropathy.  Patient presented secondary to several weeks of increasing weakness and was found down.  Upon admission, he was found to be pancytopenic, neutropenic, and with evidence of acute heart failure.   Assessment & Plan:   Principal Problem:   Pancytopenia ([place]) Active Problems:   Essential hypertension   Severe anemia   Rhabdomyolysis   Acute CHF (congestive heart failure) ([facility])   Elevated troponin mild   Pancytopenia Neutropenia Unknown etiology.  No recent infections.  No medications at home per patient.  Patient has a history of melanoma that was cured via excision.  Family history of colon cancer in his dad. Associated macrocytosis. Hemoglobin of 4.1 on admission. S/p 2 units of PRBC. LDH elevated. Bilirubin slightly elevated but trending down. -Continue 2 unit of PRBC; lasix after dose and recheck CBC -Haptoglobin pending -Fractionated bilirubin  Acute heart failure Presumed diagnosis with evidence of interstitial edema and small pleural effusions. No prior history of heart failure. -Transthoracic Echocardiogram pending -Strict in/out  Rhabdomyolysis From immobility. CK of 2394 on admission. IV fluids held secondary to concern for acute heart failure  Elevated troponin No chest pain. -Trend troponin  Sinus bradycardia Patient with an episode of fall with syncope. Unsure if related to heart rhythm vs anemia.  Hypernatremia Likely secondary to NS IV fluids -Repeat metabolic panel   DVT prophylaxis: SCDs Code Status:   Code Status: Full Code Family Communication: None at bedside Disposition Plan: Discharge pending medical improvement; PT recommendations   Consultants:   Hematology/oncology  Procedures:    None  Antimicrobials:  Ceftriaxone  Azithromycin   Subjective: No issues this morning. No chest pain or dyspnea.  Objective: Vitals:   02/17/18 1015 02/17/18 1030 02/17/18 1045 02/17/18 1100  BP: (!) 113/52   139/63  Pulse:  (!) 47 (!) 54 67  Resp:  15 18 19   Temp: 97.8 F (36.6 C)   97.9 F (36.6 C)  TempSrc: Oral     SpO2:  97% 99% 99%  Weight:      Height:        Intake/Output Summary (Last 24 hours) at 02/17/2018 1140 Last data filed at 02/17/2018 1000 Gross per 24 hour  Intake 1159.17 ml  Output 1425 ml  Net -265.83 ml   Filed Weights   02/14/2018 2059  Weight: 57 kg    Examination:  General exam: Appears calm and comfortable Respiratory system: Clear to auscultation. Respiratory effort normal. Cardiovascular system: S1 & S2 heard, RRR. No murmurs, rubs, gallops or clicks. Gastrointestinal system: Abdomen is nondistended, soft and nontender. Normal bowel sounds heard. Central nervous system: Alert and oriented. No focal neurological deficits. Extremities: No edema. No calf tenderness Skin: No cyanosis. No rashes Psychiatry: Judgement and insight appear normal. Mood & affect appropriate.     Data Reviewed: I have personally reviewed following labs and imaging studies  CBC: Recent Labs  Lab 02/08/2018 1718 02/17/18 0418  WBC 3.4* 3.5*  NEUTROABS 0.3*  --   HGB 4.1* 6.2*  HCT 13.1* 20.0*  MCV 119.1* 105.3*  PLT 42* 40*   Basic Metabolic Panel: Recent Labs  Lab 02/14/2018 1637 02/17/18 0418  NA 142 147*  K 4.0 3.2*  CL 113* 112*  CO2 19* 19*  GLUCOSE 113* 79  BUN 42* 37*  CREATININE 0.99 1.07  CALCIUM  8.2* 8.2*   GFR: Estimated Creatinine Clearance: 38.5 mL/min (by C-G formula based on SCr of 1.07 mg/dL). Liver Function Tests: Recent Labs  Lab 02/24/2018 1637 02/17/18 0418  AST 101* 90*  ALT 33 31  ALKPHOS 75 71  BILITOT 1.8* 1.6*  PROT 5.7* 5.6*  ALBUMIN 3.4* 3.2*   No results for input(s): LIPASE, AMYLASE in the last 168  hours. No results for input(s): AMMONIA in the last 168 hours. Coagulation Profile: Recent Labs  Lab 02/17/2018 1637  INR 1.34   Cardiac Enzymes: Recent Labs  Lab 02/07/2018 1637  CKTOTAL 2,394*   BNP (last 3 results) No results for input(s): PROBNP in the last 8760 hours. HbA1C: No results for input(s): HGBA1C in the last 72 hours. CBG: No results for input(s): GLUCAP in the last 168 hours. Lipid Profile: No results for input(s): CHOL, HDL, LDLCALC, TRIG, CHOLHDL, LDLDIRECT in the last 72 hours. Thyroid Function Tests: Recent Labs    02/17/18 0418  TSH 3.828   Anemia Panel: Recent Labs    02/01/2018 2020 02/17/18 0418  VITAMINB12 518  --   FOLATE 13.4  --   FERRITIN 583*  --   TIBC 257  --   IRON 238*  --   RETICCTPCT  --  1.8   Sepsis Labs: No results for input(s): PROCALCITON, LATICACIDVEN in the last 168 hours.  Recent Results (from the past 240 hour(s))  MRSA PCR Screening     Status: None   Collection Time: 02/06/2018  8:17 PM  Result Value Ref Range Status   MRSA by PCR NEGATIVE NEGATIVE Final    Comment:        The GeneXpert MRSA Assay (FDA approved for NASAL specimens only), is one component of a comprehensive MRSA colonization surveillance program. It is not intended to diagnose MRSA infection nor to guide or monitor treatment for MRSA infections. Performed at [facility], [place] [address], [place], [place] [address]          Radiology Studies: Dg Chest 2 View  Result Date: 02/23/2018 CLINICAL DATA:  [place] today.  Generalized weakness. EXAM: CHEST - 2 VIEW COMPARISON:  06/23/2012 FINDINGS: Heart size upper limits of normal. Chronic aortic atherosclerosis. Bilateral pleural effusions layering dependently with dependent atelectasis. Mild interstitial edema. No acute bone finding. IMPRESSION: Mild interstitial edema. Small effusions layering dependently with dependent atelectasis. Findings most consistent with congestive heart  failure. Could not rule out basilar pneumonia, but the findings may simply be due to effusion associated atelectasis. Electronically Signed   By: Nelson Chimes M.D.   On: 02/24/2018 16:13   Ct Head Wo Contrast  Result Date: 02/15/2018 CLINICAL DATA:  Vertigo.  Weakness. EXAM: CT HEAD WITHOUT CONTRAST TECHNIQUE: Contiguous axial images were obtained from the base of the skull through the vertex without intravenous contrast. COMPARISON:  None. FINDINGS: Brain: No subdural, epidural, or subarachnoid hemorrhage. No mass effect or midline shift. Ventricles and sulci are prominent consistent volume loss. Cerebellum, brainstem, and basal cisterns are normal. Scattered white matter changes are noted. No acute cortical ischemia or infarct. Vascular: Calcified atherosclerosis in the intracranial carotids. Skull: Normal. Negative for fracture or focal lesion. Sinuses/Orbits: No acute finding. Other: None. IMPRESSION: No acute intracranial abnormalities. No cause for syncope identified. Electronically Signed   By: Dorise Bullion III M.D   On: 02/04/2018 17:07        Scheduled Meds: . sodium chloride   Intravenous Once  . sodium chloride   Intravenous Once  .  Influenza vac split quadrivalent PF  0.5 mL Intramuscular Tomorrow-1000   Continuous Infusions:   LOS: 1 day     Cordelia Poche, MD [facility] 02/17/2018, 11:40 AM  If 7PM-7AM, please contact night-coverage [email or web address]

## 2018-02-17 NOTE — Progress Notes (Signed)
CRITICAL VALUE ALERT  Critical Value:  troponin  Date & Time Notied:  02/17/18 1500  Provider Notified: Dr. Lonny Prude  Orders Received/Actions taken: MD aware

## 2018-02-17 NOTE — Progress Notes (Signed)
PT Cancellation Note  Patient Details Name: Jason Lane MRN: [id number] DOB: [birth year]   Cancelled Treatment:    Reason Eval/Treat Not Completed: Medical issues which prohibited therapy--hgb 6.2-awaiting transfusions. Will hold PT for now and check back another day/time.    Weston Anna, PT Acute Rehabilitation Services Pager: [phone] Office: [phone]

## 2018-02-17 NOTE — Progress Notes (Signed)
  Echocardiogram 2D Echocardiogram has been performed.  Jason Lane 02/17/2018, 2:57 PM

## 2018-02-17 NOTE — Progress Notes (Signed)
[facility] NOTE  Patient Care Team: Biagio Borg, MD as PCP - General  CHIEF COMPLAINTS/PURPOSE OF CONSULTATION:  Severe pancytopenia  HISTORY OF PRESENTING ILLNESS:  Jason Lane 84 y.o. male is admitted to the hospital with severe fatigue weakness falls neuropathy.  He was noted to have severe anemia with hemoglobin of 4 and was given 2 units of packed red cells.  His hemoglobin improved slightly.  He was found found to be pancytopenic and we are consulted for further evaluation.  He tells me that he has no family members whatsoever.  He had does not have a next of kin.  He has never been married and does not have any children.  There are no distant relatives as well.  I reviewed her records extensively and collaborated the history with the patient.  MEDICAL HISTORY:  Past Medical History:  Diagnosis Date  . ANEMIA-IRON DEFICIENCY 07/08/2010  . ASTHMA 12/03/2009   States no history to asthma  . BENIGN PROSTATIC HYPERTROPHY 12/03/2009  . Cancer ([place])    skin cancer  . DIVERTICULOSIS, COLON 12/03/2009  . FATIGUE 12/03/2009  . GERD 12/03/2009  . HYPERLIPIDEMIA 12/03/2009  . HYPERTENSION 02/01/2010  . HYPERTENSION NEC 12/03/2009  . MALIGNANT MELANOMA, HX OF 12/03/2009  . SKIN LESION 12/03/2009  . TOBACCO USE, QUIT 12/03/2009  . TREMOR 12/03/2009    SURGICAL HISTORY: Past Surgical History:  Procedure Laterality Date  . COLONOSCOPY    . HERNIA REPAIR    . INGUINAL HERNIA REPAIR Left 06/24/2015   Procedure: LAPAROSCOPIC REPAIR RECURRENT LEFT INGUINAL HERNIA;  Surgeon: Greer Pickerel, MD;  Location: [facility];  Service: General;  Laterality: Left;  . inguinal herniorrhapy  1970   right  . INSERTION OF MESH Left 06/24/2015   Procedure: INSERTION OF MESH;  Surgeon: Greer Pickerel, MD;  Location: [facility];  Service: General;  Laterality: Left;  . s/p left leg melanoma      SOCIAL HISTORY: Social History   Socioeconomic History  . Marital status: Single    Spouse name: Not on file  .  Number of children: Not on file  . Years of education: Not on file  . Highest education level: Not on file  Occupational History  . 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 Financial resource strain: Not on file  . Food insecurity:    Worry: Not on file    Inability: Not on file  . Transportation needs:    Medical: Not on file    Non-medical: Not on file  Tobacco Use  . Smoking status: Former Research scientist (life sciences)  . Smokeless tobacco: Never Used  . Tobacco comment: none since approx 1985  Substance and Sexual Activity  . Alcohol use: Yes    Comment: occ beer   . Drug use: No  . Sexual activity: Not on file  Lifestyle  . Physical activity:    Days per week: Not on file    Minutes per session: Not on file  . Stress: Not on file  Relationships  . Social connections:    Talks on phone: Not on file    Gets together: Not on file    Attends religious service: Not on file    Active member of club or organization: Not on file    Attends meetings of clubs or organizations: Not on file    Relationship status: Not on file  . Intimate partner violence:    Fear of current or ex partner: Not on  file    Emotionally abused: Not on file    Physically abused: Not on file    Forced sexual activity: Not on file  Other Topics Concern  . Not on file  Social History Narrative   Just moved to [place]   Originally from [place] in [place] in [employer] in [place] with gardening/has a living will   No children/never married    FAMILY HISTORY: Family History  Problem Relation Age of Onset  . Colon cancer Father     ALLERGIES:  has No Known Allergies.  MEDICATIONS:  Current Facility-Administered Medications  Medication Dose Route Frequency Provider Last Rate Last Dose  . 0.9 %  sodium chloride infusion (Manually program via Guardrails IV Fluids)   Intravenous Once Johnson-Pitts, Endia, MD      . 0.9 %  sodium chloride infusion (Manually program via Guardrails IV Fluids)    Intravenous Once Schorr, Rhetta Mura, NP      . acetaminophen (TYLENOL) tablet 650 mg  650 mg Oral Q6H PRN Johnson-Pitts, Endia, MD       Or  . acetaminophen (TYLENOL) suppository 650 mg  650 mg Rectal Q6H PRN Johnson-Pitts, Endia, MD      . Influenza vac split quadrivalent PF (FLUZONE HIGH-DOSE) injection 0.5 mL  0.5 mL Intramuscular Tomorrow-1000 Johnson-Pitts, Endia, MD      . ondansetron (ZOFRAN) tablet 4 mg  4 mg Oral Q6H PRN Johnson-Pitts, Endia, MD       Or  . ondansetron (ZOFRAN) injection 4 mg  4 mg Intravenous Q6H PRN Johnson-Pitts, Endia, MD      . polyethylene glycol (MIRALAX / GLYCOLAX) packet 17 g  17 g Oral Daily PRN Johnson-Pitts, Endia, MD        REVIEW OF SYSTEMS:   Constitutional: Denies fevers, chills or abnormal night sweats Eyes: Denies blurriness of vision, double vision or watery eyes Ears, nose, mouth, throat, and face: Denies mucositis or sore throat Respiratory: Denies cough, dyspnea or wheezes Cardiovascular: Denies palpitation, chest discomfort or lower extremity swelling Gastrointestinal:  Denies nausea, heartburn or change in bowel habits Skin: Denies abnormal skin rashes Lymphatics: Denies new lymphadenopathy or easy bruising Neurological:Denies numbness, tingling or new weaknesses Behavioral/Psych: Mood is stable, no new changes  Breast:  Denies any palpable lumps or discharge All other systems were reviewed with the patient and are negative.  PHYSICAL EXAMINATION: ECOG PERFORMANCE STATUS: 1 - Symptomatic but completely ambulatory  Vitals:   02/17/18 1300 02/17/18 1400  BP: (!) 130/55 136/70  Pulse: (!) 51 62  Resp: 14 17  Temp:    SpO2: 99% 99%   Filed Weights   01/31/2018 2059  Weight: 125 lb 10.6 oz (57 kg)    GENERAL:alert, no distress and comfortable SKIN: skin color, texture, turgor are normal, no rashes or significant lesions EYES: normal, conjunctiva are pink and non-injected, sclera clear OROPHARYNX:no exudate, no erythema and lips,  buccal mucosa, and tongue normal  NECK: supple, thyroid normal size, non-tender, without nodularity LYMPH:  no palpable lymphadenopathy in the cervical, axillary or inguinal LUNGS: clear to auscultation and percussion with normal breathing effort HEART: regular rate & rhythm and no murmurs and no lower extremity edema ABDOMEN:abdomen soft, non-tender and normal bowel sounds Musculoskeletal:no cyanosis of digits and no clubbing  PSYCH: alert & oriented x 3 with fluent speech NEURO: no focal motor/sensory deficits   LABORATORY DATA:  I have reviewed the data as listed Lab Results  Component Value Date   WBC  4.0 02/17/2018   HGB 9.0 (L) 02/17/2018   HCT 27.7 (L) 02/17/2018   MCV 100.7 (H) 02/17/2018   PLT 42 (L) 02/17/2018   Lab Results  Component Value Date   NA 147 (H) 02/17/2018   K 3.2 (L) 02/17/2018   CL 112 (H) 02/17/2018   CO2 19 (L) 02/17/2018    RADIOGRAPHIC STUDIES: I have personally reviewed the radiological reports and agreed with the findings in the report.  ASSESSMENT AND PLAN:  1.  Severe pancytopenia: I reviewed the peripheral smear and there are excessive number of blasts. I strongly suspect acute leukemia. Flow cytometry has been requested.  We will await the results. I discussed the patient about what acute leukemia is.  He understands it very well because his sister was diagnosed with it. If the diagnosis is confirmed then please obtain hospice care for the patient because there are no treatments at elderly age for acute leukemia. I explained to him that his energy levels will slowly decline over time.  Supportive care.:  Transfuse as needed.  I will await the results of flow cytometry which we might know by the end of Monday. If the patient is discharged and I will talk to him on the phone about the results. All questions were answered. The patient knows to call the clinic with any problems, questions or concerns.    Harriette Ohara, MD @T @

## 2018-02-18 LAB — TYPE AND SCREEN
ABO/RH(D): AB POS
ANTIBODY SCREEN: NEGATIVE
UNIT DIVISION: 0
Unit division: 0
Unit division: 0
Unit division: 0

## 2018-02-18 LAB — URINE CULTURE

## 2018-02-18 LAB — COMPREHENSIVE METABOLIC PANEL
ALBUMIN: 3.1 g/dL — AB (ref 3.5–5.0)
ALK PHOS: 72 U/L (ref 38–126)
ALT: 33 U/L (ref 0–44)
AST: 81 U/L — ABNORMAL HIGH (ref 15–41)
Anion gap: 10 (ref 5–15)
BUN: 30 mg/dL — AB (ref 8–23)
CALCIUM: 7.8 mg/dL — AB (ref 8.9–10.3)
CO2: 19 mmol/L — AB (ref 22–32)
CREATININE: 0.98 mg/dL (ref 0.61–1.24)
Chloride: 110 mmol/L (ref 98–111)
GFR calc non Af Amer: >60 mL/min (ref >60)
Glucose, Bld: 104 mg/dL — ABNORMAL HIGH (ref 70–99)
Potassium: 3.3 mmol/L — ABNORMAL LOW (ref 3.5–5.1)
SODIUM: 139 mmol/L (ref 135–145)
Total Bilirubin: 1.1 mg/dL (ref 0.3–1.2)
Total Protein: 5.5 g/dL — ABNORMAL LOW (ref 6.5–8.1)

## 2018-02-18 LAB — BPAM RBC
BLOOD PRODUCT EXPIRATION DATE: 201911162359
BLOOD PRODUCT EXPIRATION DATE: 201911172359
Blood Product Expiration Date: 201911172359
Blood Product Expiration Date: 201911172359
ISSUE DATE / TIME: 201910172043
ISSUE DATE / TIME: 201910180610
ISSUE DATE / TIME: 201910172353
ISSUE DATE / TIME: 201910180931
UNIT TYPE AND RH: 6200
UNIT TYPE AND RH: 6200
Unit Type and Rh: 6200
Unit Type and Rh: 6200

## 2018-02-18 LAB — CBC
HCT: 26.9 % — ABNORMAL LOW (ref 39.0–52.0)
HEMOGLOBIN: 8.9 g/dL — AB (ref 13.0–17.0)
MCH: 32.8 pg (ref 26.0–34.0)
MCHC: 33.1 g/dL (ref 30.0–36.0)
MCV: 99.3 fL (ref 80.0–100.0)
Platelets: 37 10*3/uL — ABNORMAL LOW (ref 150–400)
RBC: 2.71 MIL/uL — ABNORMAL LOW (ref 4.22–5.81)
RDW: 21.2 % — ABNORMAL HIGH (ref 11.5–15.5)
WBC: 5.5 10*3/uL (ref 4.0–10.5)
nRBC: 1.6 % — ABNORMAL HIGH (ref 0.0–0.2)

## 2018-02-18 LAB — TROPONIN T
Troponin T TROPT: <0.01 ng/mL (ref <0.011)
Troponin T TROPT: <0.01 ng/mL (ref <0.011)

## 2018-02-18 LAB — HAPTOGLOBIN: Haptoglobin: 87 mg/dL (ref 34–200)

## 2018-02-18 LAB — TROPONIN I: Troponin I: 0.03 ng/mL (ref <0.03)

## 2018-02-18 MED ORDER — LORAZEPAM 2 MG/ML IJ SOLN
0.5000 mg | Freq: Once | INTRAMUSCULAR | Status: AC
  Administered 2018-02-18: 0.5 mg via INTRAVENOUS
  Filled 2018-02-18: qty 1

## 2018-02-18 NOTE — Evaluation (Signed)
Physical Therapy Evaluation Patient Details Name: [name] MRN: [id number] DOB: 05/30/1927 Today's Date: 02/18/2018   History of Present Illness  Jason Lane is a 84 y.o. male with medical history significant of asthma, BPH, GERD, HTN and neuropathy presents with weakness and falls.  Found to be pancytopenic, neutropenic, and underwent transfusion for hemoglobin of 4 upon admission.  He was found down at home and positive for rhabdomyolysis.   Clinical Impression  Patient presents with decreased mobility due to weakness, lethargy, limited activity tolerance, decreased cognition (currently lethargic after receiving medical restraints overnight due to delirium).  Feel he will need STSNF level rehab versus SNF under Hospice care upon d/c.  PT to follow acutely.    Follow Up Recommendations SNF;Supervision/Assistance - 24 hour    Equipment Recommendations  Other (comment)(tba)    Recommendations for Other Services       Precautions / Restrictions Precautions Precautions: Fall      Mobility  Bed Mobility Overal bed mobility: Needs Assistance Bed Mobility: Rolling;Supine to Sit Rolling: Mod assist   Supine to sit: Max assist;HOB elevated     General bed mobility comments: lethargic so repositioned in bed with nursing help, pt aroused some so attempted up OOB needing assist for legs off bed and trunk upright  Transfers Overall transfer level: Needs assistance Equipment used: Rolling walker (2 wheeled) Transfers: Sit to/from Stand Sit to Stand: Mod assist;+2 physical assistance         General transfer comment: lifting help to stand  Ambulation/Gait Ambulation/Gait assistance: Mod assist;+2 physical assistance Gait Distance (Feet): 2 Feet Assistive device: Rolling walker (2 wheeled) Gait Pattern/deviations: Step-to pattern;Scissoring;Trunk flexed;Narrow base of support     General Gait Details: limited ability to ambulate due to lethargy; though pt  attempting  Stairs            Wheelchair Mobility    Modified Rankin (Stroke Patients Only)       Balance Overall balance assessment: Needs assistance   Sitting balance-Leahy Scale: Poor Sitting balance - Comments: seated EOB initially needed min a for balance, after standing more alert sat with S   Standing balance support: Bilateral upper extremity supported Standing balance-Leahy Scale: Poor Standing balance comment: needs mod A of 2 for safety in standing with walker due to weakness/lethargy                             Pertinent Vitals/Pain Pain Assessment: Faces Pain Score: 0-No pain    Home Living Family/patient expects to be discharged to:: Private residence Living Arrangements: Alone   Type of Home: Apartment Home Access: Level entry     Home Layout: One level        Prior Function                 Hand Dominance        Extremity/Trunk Assessment   Upper Extremity Assessment Upper Extremity Assessment: Difficult to assess due to impaired cognition    Lower Extremity Assessment Lower Extremity Assessment: Difficult to assess due to impaired cognition    Cervical / Trunk Assessment Cervical / Trunk Assessment: Kyphotic;Other exceptions Cervical / Trunk Exceptions: cachexia  Communication   Communication: Other (comment)(garbaled speech due to lethargy)  Cognition Arousal/Alertness: Lethargic Behavior During Therapy: Flat affect Overall Cognitive Status: Difficult to assess  General Comments: had ativan and haldol overnight due to agitation/confusion      General Comments      Exercises     Assessment/Plan    PT Assessment Patient needs continued PT services  PT Problem List Decreased balance;Decreased knowledge of use of DME;Decreased activity tolerance;Decreased cognition;Decreased strength;Decreased mobility;Decreased safety awareness       PT Treatment  Interventions DME instruction;Therapeutic activities;Cognitive remediation;Patient/family education;Therapeutic exercise;Gait training;Functional mobility training;Balance training    PT Goals (Current goals can be found in the Care Plan section)  Acute Rehab PT Goals Patient Stated Goal: unable to state PT Goal Formulation: Patient unable to participate in goal setting Time For Goal Achievement: 03/04/18 Potential to Achieve Goals: Fair    Frequency Min 2X/week   Barriers to discharge Decreased caregiver support      Co-evaluation               AM-PAC PT "6 Clicks" Daily Activity  Outcome Measure Difficulty turning over in bed (including adjusting bedclothes, sheets and blankets)?: Unable Difficulty moving from lying on back to sitting on the side of the bed? : Unable Difficulty sitting down on and standing up from a chair with arms (e.g., wheelchair, bedside commode, etc,.)?: Unable Help needed moving to and from a bed to chair (including a wheelchair)?: A Lot Help needed walking in hospital room?: A Lot Help needed climbing 3-5 steps with a railing? : Total 6 Click Score: 8    End of Session Equipment Utilized During Treatment: Gait belt Activity Tolerance: Patient limited by lethargy Patient left: with call bell/phone within reach;in bed;with bed alarm set;with restraints reapplied Nurse Communication: Mobility status PT Visit Diagnosis: Other abnormalities of gait and mobility (R26.89);History of falling (Z91.81);Muscle weakness (generalized) (M62.81);Adult, failure to thrive (R62.7)    Time: 0932-0950 PT Time Calculation (min) (ACUTE ONLY): 18 min   Charges:   PT Evaluation $PT Eval High Complexity: 1 High          Magda Kiel, [place] [facility] [phone] 02/18/2018   Reginia Naas 02/18/2018, 10:58 AM

## 2018-02-18 NOTE — Progress Notes (Signed)
PROGRESS NOTE    Jason Lane  RXV:[id number] DOB: 04-07-1928 DOA: 02/15/2018 PCP: Biagio Borg, MD   Brief Narrative: Jason Lane is a 84 y.o. male with a history of neuropathy.  Patient presented secondary to several weeks of increasing weakness and was found down.  Upon admission, he was found to be pancytopenic, neutropenic, and with evidence of acute heart failure.   Assessment & Plan:   Principal Problem:   Pancytopenia ([place]) Active Problems:   Essential hypertension   Severe anemia   Rhabdomyolysis   Pulmonary edema   Elevated troponin mild   Pancytopenia Neutropenia Unknown etiology.  No recent infections.  No medications at home per patient.  Patient has a history of melanoma that was cured via excision.  Family history of colon cancer in his dad. Associated macrocytosis. Hemoglobin of 4.1 on admission. S/p 4 units of PRBC. LDH elevated. Bilirubin slightly elevated but trending down. Haptoglobin normal. Platelets trended down. -Daily CBC -Hematology/oncology recommendations: Flow cytometry, likely hospice if confirmation of acute leukemia, palliative care consult  Pulmonary edema No evidence of heart failure on echocardiogram.  Rhabdomyolysis From immobility. CK of 2394 on admission. IV fluids held secondary to concern for acute heart failure  Elevated troponin No chest pain. Troponin with a flat trend. In setting of rhabdomyolysis.  Sinus bradycardia Patient with an episode of fall with syncope. Unsure if related to heart rhythm vs anemia.  Hypernatremia Likely secondary to NS IV fluids. Resolved.   DVT prophylaxis: SCDs Code Status:   Code Status: Full Code Family Communication: None at bedside Disposition Plan: Discharge likely to hospice pending definitive diagnosis of leukemia and palliative care discussions   Consultants:   Hematology/oncology  Procedures:   None  Antimicrobials:  Ceftriaxone  Azithromycin   Subjective: Agitated  overnight.  Objective: Vitals:   02/18/18 0400 02/18/18 0800 02/18/18 0810 02/18/18 0900  BP: 135/66 131/60  (!) 159/55  Pulse: (!) 53 (!) 50  (!) 48  Resp: 16 16  13   Temp:   (!) 97.4 F (36.3 C)   TempSrc:   Axillary   SpO2: 96% 98%  98%  Weight:      Height:        Intake/Output Summary (Last 24 hours) at 02/18/2018 1131 Last data filed at 02/18/2018 0438 Gross per 24 hour  Intake 346 ml  Output 1800 ml  Net -1454 ml   Filed Weights   02/17/2018 2059  Weight: 57 kg    Examination:  General exam: Appears calm and comfortable Respiratory system: Clear to auscultation. Respiratory effort normal. Cardiovascular system: S1 & S2 heard, RRR. No murmurs, rubs, gallops or clicks. Gastrointestinal system: Abdomen is nondistended, soft and nontender. Normal bowel sounds heard. Central nervous system: Asleep Extremities: No edema. No calf tenderness Skin: No cyanosis. No rashes    Data Reviewed: I have personally reviewed following labs and imaging studies  CBC: Recent Labs  Lab 02/19/2018 1718 02/17/18 0418 02/17/18 1331 02/18/18 0614  WBC 3.4* 3.5* 4.0 5.5  NEUTROABS 0.3*  --   --   --   HGB 4.1* 6.2* 9.0* 8.9*  HCT 13.1* 20.0* 27.7* 26.9*  MCV 119.1* 105.3* 100.7* 99.3  PLT 42* 40* 42* 37*   Basic Metabolic Panel: Recent Labs  Lab 02/05/2018 1637 02/17/18 0418 02/18/18 0614  NA 142 147* 139  K 4.0 3.2* 3.3*  CL 113* 112* 110  CO2 19* 19* 19*  GLUCOSE 113* 79 104*  BUN 42* 37* 30*  CREATININE 0.99 1.07 0.98  CALCIUM 8.2* 8.2* 7.8*   GFR: Estimated Creatinine Clearance: 42 mL/min (by C-G formula based on SCr of 0.98 mg/dL). Liver Function Tests: Recent Labs  Lab 02/11/2018 1637 02/17/18 0418 02/17/18 1331 02/18/18 0614  AST 101* 90*  --  81*  ALT 33 31  --  33  ALKPHOS 75 71  --  72  BILITOT 1.8* 1.6* 1.4* 1.1  PROT 5.7* 5.6*  --  5.5*  ALBUMIN 3.4* 3.2*  --  3.1*   No results for input(s): LIPASE, AMYLASE in the last 168 hours. No results for  input(s): AMMONIA in the last 168 hours. Coagulation Profile: Recent Labs  Lab 02/23/2018 1637  INR 1.34   Cardiac Enzymes: Recent Labs  Lab 02/06/2018 1637 02/17/18 1331 02/17/18 1846 02/17/18 2315  CKTOTAL 2,394*  --   --   --   TROPONINI  --  0.03* 0.03* 0.03*   BNP (last 3 results) No results for input(s): PROBNP in the last 8760 hours. HbA1C: No results for input(s): HGBA1C in the last 72 hours. CBG: No results for input(s): GLUCAP in the last 168 hours. Lipid Profile: No results for input(s): CHOL, HDL, LDLCALC, TRIG, CHOLHDL, LDLDIRECT in the last 72 hours. Thyroid Function Tests: Recent Labs    02/17/18 0418  TSH 3.828   Anemia Panel: Recent Labs    02/09/2018 2020 02/17/18 0418  VITAMINB12 518  --   FOLATE 13.4  --   FERRITIN 583*  --   TIBC 257  --   IRON 238*  --   RETICCTPCT  --  1.8   Sepsis Labs: No results for input(s): PROCALCITON, LATICACIDVEN in the last 168 hours.  Recent Results (from the past 240 hour(s))  Urine culture     Status: Abnormal   Collection Time: 02/27/2018  5:55 PM  Result Value Ref Range Status   Specimen Description   Final    URINE, RANDOM Performed at [facility] [address], [place], [place] [address]    Special Requests   Final    NONE Performed at [facility], [place] [address], [place], [place] [address]    Culture MULTIPLE SPECIES PRESENT, SUGGEST RECOLLECTION (A)  Final   Report Status 02/18/2018 FINAL  Final  MRSA PCR Screening     Status: None   Collection Time: 02/28/2018  8:17 PM  Result Value Ref Range Status   MRSA by PCR NEGATIVE NEGATIVE Final    Comment:        The GeneXpert MRSA Assay (FDA approved for NASAL specimens only), is one component of a comprehensive MRSA colonization surveillance program. It is not intended to diagnose MRSA infection nor to guide or monitor treatment for MRSA infections. Performed at [facility], [place] [address] [place], [place] [address]          Radiology Studies: Dg Chest 2 View  Result Date: 02/10/2018 CLINICAL DATA:  [place] today.  Generalized weakness. EXAM: CHEST - 2 VIEW COMPARISON:  06/23/2012 FINDINGS: Heart size upper limits of normal. Chronic aortic atherosclerosis. Bilateral pleural effusions layering dependently with dependent atelectasis. Mild interstitial edema. No acute bone finding. IMPRESSION: Mild interstitial edema. Small effusions layering dependently with dependent atelectasis. Findings most consistent with congestive heart failure. Could not rule out basilar pneumonia, but the findings may simply be due to effusion associated atelectasis. Electronically Signed   By: Nelson Chimes M.D.   On: 02/28/2018 16:13   Ct Head Wo Contrast  Result Date: 02/04/2018 CLINICAL  DATA:  Vertigo.  Weakness. EXAM: CT HEAD WITHOUT CONTRAST TECHNIQUE: Contiguous axial images were obtained from the base of the skull through the vertex without intravenous contrast. COMPARISON:  None. FINDINGS: Brain: No subdural, epidural, or subarachnoid hemorrhage. No mass effect or midline shift. Ventricles and sulci are prominent consistent volume loss. Cerebellum, brainstem, and basal cisterns are normal. Scattered white matter changes are noted. No acute cortical ischemia or infarct. Vascular: Calcified atherosclerosis in the intracranial carotids. Skull: Normal. Negative for fracture or focal lesion. Sinuses/Orbits: No acute finding. Other: None. IMPRESSION: No acute intracranial abnormalities. No cause for syncope identified. Electronically Signed   By: Dorise Bullion III M.D   On: 03/01/2018 17:07        Scheduled Meds: . sodium chloride   Intravenous Once  . sodium chloride   Intravenous Once   Continuous Infusions:   LOS: 2 days     Cordelia Poche, MD [facility] 02/18/2018, 11:31 AM  If 7PM-7AM, please contact night-coverage [email or web address]

## 2018-02-18 NOTE — Progress Notes (Signed)
CRITICAL VALUE ALERT  Critical Value: troponin 0.03 Date & Time Notied:  02/18/18 0046  Provider Notified: bodenheimer  Orders Received/Actions taken: awaiting orders

## 2018-02-18 NOTE — Progress Notes (Signed)
Provider notified at 2240 that pt was becoming increasingly confused which is a change from his baseline, pulling at lines, trying to climb out of bed. Informed that it was likely delirium. Order given for 2.5 mg of IV haldol. Administered. Pt's confusion continue to worsen, multiple attempts to climb out of bed, constantly pulling at lines. MD notified again, given order for 0.5 mg of ativan, mittens placed, telelsitter ordered and place. Pt is alert and confused in bed. Will continue to monitor closely at this time

## 2018-02-19 DIAGNOSIS — Z7189 Other specified counseling: Secondary | ICD-10-CM

## 2018-02-19 DIAGNOSIS — Z515 Encounter for palliative care: Secondary | ICD-10-CM

## 2018-02-19 LAB — CBC
HCT: 25.9 % — ABNORMAL LOW (ref 39.0–52.0)
Hemoglobin: 8.6 g/dL — ABNORMAL LOW (ref 13.0–17.0)
MCH: 32.7 pg (ref 26.0–34.0)
MCHC: 33.2 g/dL (ref 30.0–36.0)
MCV: 98.5 fL (ref 80.0–100.0)
PLATELETS: 34 10*3/uL — AB (ref 150–400)
RBC: 2.63 MIL/uL — ABNORMAL LOW (ref 4.22–5.81)
RDW: 20.7 % — AB (ref 11.5–15.5)
WBC: 5.3 10*3/uL (ref 4.0–10.5)
nRBC: 1 % — ABNORMAL HIGH (ref 0.0–0.2)

## 2018-02-19 NOTE — Consult Note (Signed)
Consultation Note Date: 02/19/2018   Patient Name: Jason Lane  DOB: Dec 10, 1927  MRN: [id number]  Age / Sex: 84 y.o., male  PCP: Jason Borg, MD Referring Physician: Mariel Aloe, MD  Reason for Consultation: Establishing goals of care  HPI/Patient Profile: 84 y.o. male admitted on 02/12/2018   Clinical Assessment and Goals of Care:  84 year old gentleman with severe fatigue weakness and falls and neuropathy. Patient was admitted to the hospital with feeling low hemoglobin of 4 g/dL. In addition he was found to have neutropenia, pancytopenia. He was seen and evaluated by hematology oncology and is undergoing evaluation. There remains a high suspicion for possibility of acute leukemia.  Palliative consultation for goals of care discussions.  Patient is asleep in bed. He awakens easily. He is slow to respond but is completely awake alert oriented and answers all questions appropriately. I introduced myself and palliative care as follows: Palliative medicine is specialized medical care for people living with serious illness. It focuses on providing relief from the symptoms and stress of a serious illness. The goal is to improve quality of life for both the patient and the family.  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 He states he does not have any family any next of kin, does not have a spouse does not have children. He has no contacts. He was living alone in [place], [place]. He recalls that he was getting progressively weaker and was having a lot of falls. Discussed frankly and compassionately about the ability of the patient having acute leukemia. He states, "what are we going to do?"  We discussed about CODE STATUS. Patient states,"If I die, I die". But when discuss further about the full scope of DO NOT RESUSCITATE/DO NOT INTUBATE and a mode of care that focuses  exclusively on comfort measures, the patient does not respond much. We will simply have to have this conversation at a later time and continue this conversation.  Patient denies any pain, denies any shortness of breath. He does have diminished appetite.  Please note additional discussions/recommendations as listed below. Thank you for the consult.  NEXT OF KIN  no spouse, no kids.  Patient states he has no friends, no contacts.   SUMMARY OF RECOMMENDATIONS    1. Full code for now. Ongoing discussions with the patient about DO NOT RESUSCITATE/DO NOT INTUBATE. Will follow-up. 2. Await flow cytometry results. However, have initiated hospice philosophy of care discussions with the patient. He is accepting of hospice services. 3. Consider skilled nursing facility rehabilitation with palliative care following versus residential hospice on discharge, depending on hospital course and disease trajectory. 4. There is no next of kin. At present, the patient is decisional and able to make his own decisions. No spouse, no kids. Patient states he has no friends or associates or contacts. Would recommend clinical social worker input regarding pursuing guardianship.  Thank you for the consult.  Code Status/Advance Care Planning:  Full code    Symptom Management:  continue current mode of care  Palliative Prophylaxis:   Delirium Protocol   Psycho-social/Spiritual:   Desire for further Chaplaincy support:yes  Additional Recommendations: Education on Hospice  Prognosis:   Unable to determine  Discharge Planning: To Be Determined      Primary Diagnoses: Present on Admission: . Essential hypertension . Severe anemia   I have reviewed the medical record, interviewed the patient and family, and examined the patient. The following aspects are pertinent.  Past Medical History:  Diagnosis Date  . ANEMIA-IRON DEFICIENCY 07/08/2010  . ASTHMA 12/03/2009   States no history to asthma  .  BENIGN PROSTATIC HYPERTROPHY 12/03/2009  . Cancer ([place])    skin cancer  . DIVERTICULOSIS, COLON 12/03/2009  . FATIGUE 12/03/2009  . GERD 12/03/2009  . HYPERLIPIDEMIA 12/03/2009  . HYPERTENSION 02/01/2010  . HYPERTENSION NEC 12/03/2009  . MALIGNANT MELANOMA, HX OF 12/03/2009  . SKIN LESION 12/03/2009  . TOBACCO USE, QUIT 12/03/2009  . TREMOR 12/03/2009   Social History   Socioeconomic History  . Marital status: Single    Spouse name: Not on file  . Number of children: Not on file  . Years of education: Not on file  . Highest education level: Not on file  Occupational History  . Occupation: lived in [place] as Chief Financial Officer  Social Needs  . Financial resource strain: Not on file  . Food insecurity:    Worry: Not on file    Inability: Not on file  . Transportation needs:    Medical: Not on file    Non-medical: Not on file  Tobacco Use  . Smoking status: Former Research scientist (life sciences)  . Smokeless tobacco: Never Used  . Tobacco comment: none since approx 1985  Substance and Sexual Activity  . Alcohol use: Yes    Comment: occ beer   . Drug use: No  . Sexual activity: Not on file  Lifestyle  . Physical activity:    Days per week: Not on file    Minutes per session: Not on file  . Stress: Not on file  Relationships  . Social connections:    Talks on phone: Not on file    Gets together: Not on file    Attends religious service: Not on file    Active member of club or organization: Not on file    Attends meetings of clubs or organizations: Not on file    Relationship status: Not on file  Other Topics Concern  . Not on file  Social History Narrative   Just moved to [place]   Originally from [place] in [place] in [employer] in [place] with gardening/has a living will   No children/never married   Family History  Problem Relation Age of Onset  . Colon cancer Father    Scheduled Meds: . sodium chloride   Intravenous Once  . sodium chloride   Intravenous Once   Continuous Infusions: PRN  Meds:.acetaminophen **OR** acetaminophen, ondansetron **OR** ondansetron (ZOFRAN) IV, polyethylene glycol Medications Prior to Admission:  Prior to Admission medications   Medication Sig Start Date End Date Taking? Authorizing Provider  cholecalciferol (VITAMIN D-400) 400 UNITS TABS Take 400 Units by mouth daily.    Yes [provider]  Cinnamon 500 MG capsule Take 500 mg by mouth daily.     Yes [provider]  co-enzyme Q-10 30 MG capsule Take 30 mg by mouth 3 (three) times daily.   Yes [provider]  gabapentin (NEURONTIN) 100 MG capsule Take 1  capsule (100 mg total) by mouth at bedtime. 12/09/16  Yes Lyndal Pulley, DO  meclizine (ANTIVERT) 12.5 MG tablet Take 1 tablet (12.5 mg total) by mouth 3 (three) times daily as needed for dizziness. 08/15/17 08/15/18 Yes Jason Borg, MD  Multiple Vitamin (MULTIVITAMIN) capsule Take 1 capsule by mouth daily.     Yes [provider]  tamsulosin (FLOMAX) 0.4 MG CAPS capsule TAKE 1 CAPSULE BY MOUTH EVERY DAY Patient not taking: Reported on 02/25/2018 06/06/17   Jason Borg, MD   No Known Allergies Review of Systems Denies pain Appears weak Physical Exam Elderly gentleman appears weak No acute distress Preoperative sounds Appears pale S1-S2 Abdomen is non tender non distended No edema no rashes Awake alert oriented answers all questions appropriately  Vital Signs: BP (!) 148/88   Pulse 60   Temp 97.9 F (36.6 C) (Oral)   Resp 16   Ht 5\' 8"  (1.727 m)   Wt 57 kg   SpO2 98%   BMI 19.11 kg/m  Pain Scale: 0-10   Pain Score: Asleep   SpO2: SpO2: 98 % O2 Device:SpO2: 98 % O2 Flow Rate: .   IO: Intake/output summary:   Intake/Output Summary (Last 24 hours) at 02/19/2018 1442 Last data filed at 02/19/2018 0926 Gross per 24 hour  Intake 240 ml  Output 525 ml  Net -285 ml    LBM: Last BM Date: 02/14/18 Baseline Weight: Weight: 57 kg Most recent weight: Weight: 57 kg     Palliative  Assessment/Data:  PPS 30%   Time In:  1300 Time Out:  1410 Time Total:  70 min  Greater than 50%  of this time was spent counseling and coordinating care related to the above assessment and plan.  Signed by: Loistine Chance, MD  [phone]  Please contact Palliative Medicine Team phone at [phone] for questions and concerns.  For individual provider: See Shea Evans

## 2018-02-19 NOTE — Progress Notes (Signed)
PROGRESS NOTE    Jason Lane  VHQ:[id number] DOB: [birth year] DOA: 02/06/2018 PCP: Biagio Borg, MD   Brief Narrative: Jason Lane is a 84 y.o. male with a history of neuropathy.  Patient presented secondary to several weeks of increasing weakness and was found down.  Upon admission, he was found to be pancytopenic, neutropenic, and with evidence of acute heart failure.   Assessment & Plan:   Principal Problem:   Pancytopenia ([place]) Active Problems:   Essential hypertension   Severe anemia   Rhabdomyolysis   Pulmonary edema   Elevated troponin mild   Pancytopenia Neutropenia Unknown etiology.  No recent infections.  No medications at home per patient.  Patient has a history of melanoma that was cured via excision.  Family history of colon cancer in his dad. Associated macrocytosis. Hemoglobin of 4.1 on admission. S/p 4 units of PRBC. LDH elevated. Bilirubin slightly elevated but trended down. Haptoglobin normal. Platelets continue to trend down. -Daily CBC -Hematology/oncology recommendations: Flow cytometry, likely hospice if confirmation of acute leukemia, palliative care consult -Watch for bleeding  Pulmonary edema No evidence of heart failure on echocardiogram.  Rhabdomyolysis From immobility. CK of 2394 on admission. IV fluids held secondary to concern for acute heart failure -Repeat CK  Elevated troponin No chest pain. Troponin with a flat trend. In setting of rhabdomyolysis.  Sinus bradycardia Patient with an episode of fall with syncope. Unsure if related to heart rhythm vs anemia.  Hypernatremia Likely secondary to NS IV fluids. Resolved.   DVT prophylaxis: SCDs Code Status:   Code Status: Full Code Family Communication: None at bedside Disposition Plan: Discharge likely to hospice vs SNF pending definitive diagnosis of leukemia and palliative care discussions   Consultants:   Hematology/oncology  Procedures:    None  Antimicrobials:  Ceftriaxone  Azithromycin   Subjective: No issues overnight. No agitation.  Objective: Vitals:   02/18/18 1600 02/18/18 1700 02/18/18 2036 02/19/18 0521  BP: (!) 136/92 (!) 147/71 (!) 159/74 (!) 122/53  Pulse: 65 61 68 62  Resp: (!) 21 19 19 19   Temp:   98.4 F (36.9 C) 98.3 F (36.8 C)  TempSrc:   Oral Oral  SpO2: 95% 100% 99% 95%  Weight:      Height:        Intake/Output Summary (Last 24 hours) at 02/19/2018 1044 Last data filed at 02/19/2018 0926 Gross per 24 hour  Intake 240 ml  Output 525 ml  Net -285 ml   Filed Weights   02/24/2018 2059  Weight: 57 kg    Examination:  General exam: Appears calm and comfortable Respiratory system: Clear to auscultation. Respiratory effort normal. Cardiovascular system: S1 & S2 heard, RRR. No murmurs, rubs, gallops or clicks. Gastrointestinal system: Abdomen is nondistended, soft and nontender. No organomegaly or masses felt. Normal bowel sounds heard. Central nervous system: Alert and oriented to person and place. No focal neurological deficits. Extremities: No edema. No calf tenderness Skin: No cyanosis. No rashes Psychiatry: Judgement and insight appear normal. Flat affect   Data Reviewed: I have personally reviewed following labs and imaging studies  CBC: Recent Labs  Lab 02/02/2018 1718 02/17/18 0418 02/17/18 1331 02/18/18 0614 02/19/18 0520  WBC 3.4* 3.5* 4.0 5.5 5.3  NEUTROABS 0.3*  --   --   --   --   HGB 4.1* 6.2* 9.0* 8.9* 8.6*  HCT 13.1* 20.0* 27.7* 26.9* 25.9*  MCV 119.1* 105.3* 100.7* 99.3 98.5  PLT 42* 40* 42* 37* 34*   Basic  Metabolic Panel: Recent Labs  Lab 02/01/2018 1637 02/17/18 0418 02/18/18 0614  NA 142 147* 139  K 4.0 3.2* 3.3*  CL 113* 112* 110  CO2 19* 19* 19*  GLUCOSE 113* 79 104*  BUN 42* 37* 30*  CREATININE 0.99 1.07 0.98  CALCIUM 8.2* 8.2* 7.8*   GFR: Estimated Creatinine Clearance: 42 mL/min (by C-G formula based on SCr of 0.98 mg/dL). Liver  Function Tests: Recent Labs  Lab 02/25/2018 1637 02/17/18 0418 02/17/18 1331 02/18/18 0614  AST 101* 90*  --  81*  ALT 33 31  --  33  ALKPHOS 75 71  --  72  BILITOT 1.8* 1.6* 1.4* 1.1  PROT 5.7* 5.6*  --  5.5*  ALBUMIN 3.4* 3.2*  --  3.1*   No results for input(s): LIPASE, AMYLASE in the last 168 hours. No results for input(s): AMMONIA in the last 168 hours. Coagulation Profile: Recent Labs  Lab 02/14/2018 1637  INR 1.34   Cardiac Enzymes: Recent Labs  Lab 03/02/2018 1637 02/17/18 1331 02/17/18 1846 02/17/18 2315  CKTOTAL 2,394*  --   --   --   TROPONINI  --  0.03* 0.03* 0.03*   BNP (last 3 results) No results for input(s): PROBNP in the last 8760 hours. HbA1C: No results for input(s): HGBA1C in the last 72 hours. CBG: No results for input(s): GLUCAP in the last 168 hours. Lipid Profile: No results for input(s): CHOL, HDL, LDLCALC, TRIG, CHOLHDL, LDLDIRECT in the last 72 hours. Thyroid Function Tests: Recent Labs    02/17/18 0418  TSH 3.828   Anemia Panel: Recent Labs    02/13/2018 2020 02/17/18 0418  VITAMINB12 518  --   FOLATE 13.4  --   FERRITIN 583*  --   TIBC 257  --   IRON 238*  --   RETICCTPCT  --  1.8   Sepsis Labs: No results for input(s): PROCALCITON, LATICACIDVEN in the last 168 hours.  Recent Results (from the past 240 hour(s))  Urine culture     Status: Abnormal   Collection Time: 02/12/2018  5:55 PM  Result Value Ref Range Status   Specimen Description   Final    URINE, RANDOM Performed at [facility] [address] [place], [place] [address]    Special Requests   Final    NONE Performed at [facility], [place] [address], [place], [place] [address]    Culture MULTIPLE SPECIES PRESENT, SUGGEST RECOLLECTION (A)  Final   Report Status 02/18/2018 FINAL  Final  MRSA PCR Screening     Status: None   Collection Time: 02/15/2018  8:17 PM  Result Value Ref Range Status   MRSA by PCR NEGATIVE NEGATIVE Final     Comment:        The GeneXpert MRSA Assay (FDA approved for NASAL specimens only), is one component of a comprehensive MRSA colonization surveillance program. It is not intended to diagnose MRSA infection nor to guide or monitor treatment for MRSA infections. Performed at [facility], [place] [address], [place], [place] [address]          Radiology Studies: No results found.      Scheduled Meds: . sodium chloride   Intravenous Once  . sodium chloride   Intravenous Once   Continuous Infusions:   LOS: 3 days     Cordelia Poche, MD [facility] 02/19/2018, 10:44 AM  If 7PM-7AM, please contact night-coverage [email or web address]

## 2018-02-20 LAB — DIFFERENTIAL

## 2018-02-20 LAB — BASIC METABOLIC PANEL
Anion gap: 7 (ref 5–15)
BUN: 24 mg/dL — ABNORMAL HIGH (ref 8–23)
CALCIUM: 7.8 mg/dL — AB (ref 8.9–10.3)
CHLORIDE: 107 mmol/L (ref 98–111)
CO2: 23 mmol/L (ref 22–32)
Creatinine, Ser: 0.86 mg/dL (ref 0.61–1.24)
GFR calc Af Amer: >60 mL/min (ref >60)
GFR calc non Af Amer: >60 mL/min (ref >60)
GLUCOSE: 111 mg/dL — AB (ref 70–99)
POTASSIUM: 3.4 mmol/L — AB (ref 3.5–5.1)
Sodium: 137 mmol/L (ref 135–145)

## 2018-02-20 LAB — CBC WITH DIFFERENTIAL/PLATELET
Abs Immature Granulocytes: 0.05 10*3/uL (ref 0.00–0.07)
Basophils Absolute: 0 10*3/uL (ref 0.0–0.1)
Basophils Relative: 0 %
Blasts: 65 %
EOS ABS: 0 10*3/uL (ref 0.0–0.5)
EOS PCT: 0 %
HCT: 13.1 % — ABNORMAL LOW (ref 39.0–52.0)
HEMOGLOBIN: 4.1 g/dL — AB (ref 13.0–17.0)
IMMATURE GRANULOCYTES: 2 %
LYMPHS ABS: 0.7 10*3/uL (ref 0.7–4.0)
LYMPHS PCT: 20 %
MCH: 37.3 pg — AB (ref 26.0–34.0)
MCHC: 31.3 g/dL (ref 30.0–36.0)
MCV: 119.1 fL — ABNORMAL HIGH (ref 80.0–100.0)
MONOS PCT: 5 %
Monocytes Absolute: 0.2 10*3/uL (ref 0.1–1.0)
Neutro Abs: 0.3 10*3/uL — ABNORMAL LOW (ref 1.7–7.7)
Neutrophils Relative %: 10 %
Platelets: 42 10*3/uL — ABNORMAL LOW (ref 150–400)
RBC: 1.1 MIL/uL — AB (ref 4.22–5.81)
RDW: 21.6 % — ABNORMAL HIGH (ref 11.5–15.5)
WBC: 3.4 10*3/uL — AB (ref 4.0–10.5)
nRBC: 1.2 % — ABNORMAL HIGH (ref 0.0–0.2)

## 2018-02-20 LAB — CK: Total CK: 229 U/L (ref 49–397)

## 2018-02-20 LAB — CBC
HEMATOCRIT: 27.6 % — AB (ref 39.0–52.0)
HEMOGLOBIN: 9.1 g/dL — AB (ref 13.0–17.0)
MCH: 32.5 pg (ref 26.0–34.0)
MCHC: 33 g/dL (ref 30.0–36.0)
MCV: 98.6 fL (ref 80.0–100.0)
Platelets: 35 10*3/uL — ABNORMAL LOW (ref 150–400)
RBC: 2.8 MIL/uL — ABNORMAL LOW (ref 4.22–5.81)
RDW: 19.9 % — AB (ref 11.5–15.5)
WBC: 6.8 10*3/uL (ref 4.0–10.5)
nRBC: 0.6 % — ABNORMAL HIGH (ref 0.0–0.2)

## 2018-02-20 MED ORDER — HALOPERIDOL LACTATE 5 MG/ML IJ SOLN
1.0000 mg | Freq: Four times a day (QID) | INTRAMUSCULAR | Status: DC | PRN
  Administered 2018-02-20 – 2018-02-21 (×2): 1 mg via INTRAVENOUS
  Filled 2018-02-20 (×2): qty 1

## 2018-02-20 MED ORDER — POTASSIUM CHLORIDE CRYS ER 20 MEQ PO TBCR
40.0000 meq | EXTENDED_RELEASE_TABLET | Freq: Once | ORAL | Status: AC
  Administered 2018-02-20: 40 meq via ORAL
  Filled 2018-02-20: qty 2

## 2018-02-20 NOTE — Progress Notes (Signed)
Daily Progress Note   Patient Name: Jason Lane       Date: 02/20/2018 DOB: 02-28-1928  Age: 84 y.o. MRN#: [id number] Attending Physician: Mariel Aloe, MD Primary Care Physician: Biagio Borg, MD Admit Date: 02/24/2018  Reason for Consultation/Follow-up: Establishing goals of care  Subjective:  resting in bed Is awake Oriented and is able to converse freely In no distress See below   Length of Stay: 4  Current Medications: Scheduled Meds:  . sodium chloride   Intravenous Once  . sodium chloride   Intravenous Once  . potassium chloride  40 mEq Oral Once    Continuous Infusions:   PRN Meds: acetaminophen **OR** acetaminophen, ondansetron **OR** ondansetron (ZOFRAN) IV, polyethylene glycol  Physical Exam         General exam: Appears calm and comfortable Respiratory system: Clear to auscultation. Respiratory effort normal. Cardiovascular system: S1 & S2 heard, RRR. No murmurs, rubs, gallops or clicks. Gastrointestinal system: Abdomen is nondistended, soft and nontender. Normal bowel sounds heard. Central nervous system: Alert and oriented to person and place. Tremor. Extremities: No edema. No calf tenderness Skin: No cyanosis. No rashes   Vital Signs: BP (!) 158/85 (BP Location: Right Arm)   Pulse 65   Temp 98.4 F (36.9 C) (Oral)   Resp 18   Ht 5\' 8"  (1.727 m)   Wt 57 kg   SpO2 99%   BMI 19.11 kg/m  SpO2: SpO2: 99 % O2 Device: O2 Device: Room Air O2 Flow Rate:    Intake/output summary:   Intake/Output Summary (Last 24 hours) at 02/20/2018 1113 Last data filed at 02/20/2018 9407 Gross per 24 hour  Intake 840 ml  Output 1700 ml  Net -860 ml   LBM: Last BM Date: 02/14/18 Baseline Weight: Weight: 57 kg Most recent weight: Weight: 57 kg         Palliative Assessment/Data:      Patient Active Problem List   Diagnosis Date Noted  . Palliative care by specialist   . Goals of care, counseling/discussion   . Pancytopenia ([place]) 02/05/2018  . Severe anemia 02/20/2018  . Rhabdomyolysis 02/25/2018  . Pulmonary edema 02/07/2018  . Elevated troponin mild 02/19/2018  . Gait disorder 08/01/2017  . BPPV (benign paroxysmal positional vertigo) 08/01/2017  . Gross hematuria 03/30/2017  . BRBPR (bright red  blood per rectum) 03/30/2017  . Rotator cuff tear arthropathy of both shoulders 11/30/2016  . Hyperglycemia 06/29/2016  . S/P laparoscopic hernia repair 06/24/2015  . Inguinal hernia, left 05/27/2015  . Skin lesion 05/27/2015  . Bradycardia 12/24/2014  . Chest pain 06/23/2012  . Peripheral neuropathy 12/19/2011  . Hematochezia 12/15/2011  . Leg cramps 12/15/2011  . Bladder neck obstruction 12/15/2011  . Vertigo 09/11/2011  . Preventative health care 09/09/2010  . ANEMIA-IRON DEFICIENCY 07/08/2010  . Essential hypertension 02/01/2010  . Hyperlipidemia 12/03/2009  . Asthma 12/03/2009  . GERD 12/03/2009  . DIVERTICULOSIS, COLON 12/03/2009  . BPH associated with nocturia 12/03/2009  . FATIGUE 12/03/2009  . TREMOR 12/03/2009  . MALIGNANT MELANOMA, HX OF 12/03/2009  . TOBACCO USE, QUIT 12/03/2009    Palliative Care Assessment & Plan   Patient Profile:    Assessment:  84 year old gentleman with severe fatigue weakness and falls and neuropathy. Patient was admitted to the hospital with feeling low hemoglobin of 4 g/dL. In addition he was found to have neutropenia, pancytopenia. He was seen and evaluated by hematology oncology and is undergoing evaluation. There remains a high suspicion for possibility of acute leukemia.  Palliative consultation for goals of care discussions.   Recommendations/Plan:  Code status now clarified as DNR DNI. Discussed with patient in detail.   Await flow cytometry results  Discussed  with patient about SNF rehab with palliative, and eventually, him needing hospice services, also discussed with him about residential hospice. He'd rather go home, but we discussed about his ongoing weakness, falls, progressive functional decline. There are no next of kin and patient lives alone. He is accepting of the need for SNF at this time.   PMT will continue to follow.    Code Status:    Code Status Orders  (From admission, onward)         Start     Ordered   02/20/18 1114  Do not attempt resuscitation (DNR)  Continuous    Question Answer Comment  In the event of cardiac or respiratory ARREST Do not call a "code blue"   In the event of cardiac or respiratory ARREST Do not perform Intubation, CPR, defibrillation or ACLS   In the event of cardiac or respiratory ARREST Use medication by any route, position, wound care, and other measures to relive pain and suffering. May use oxygen, suction and manual treatment of airway obstruction as needed for comfort.      02/20/18 1113        Code Status History    Date Active Date Inactive Code Status Order ID Comments User Context   02/26/2018 2049 02/20/2018 1113 Full Code [id number]  Johnson-Pitts, Endia, MD Inpatient       Prognosis:   < 3 months Could be much shorter prognosis, depending on his test results and his disease trajectory.   Discharge Planning:  To Be Determined  Care plan was discussed with patient.   Thank you for allowing the Palliative Medicine Team to assist in the care of this patient.   Time In:  11 Time Out: 11.25 Total Time 25 Prolonged Time Billed  no       Greater than 50%  of this time was spent counseling and coordinating care related to the above assessment and plan.  Loistine Chance, MD [phone]  Please contact Palliative Medicine Team phone at [phone] for questions and concerns.

## 2018-02-20 NOTE — Care Management Important Message (Signed)
Important Message  Patient Details  Name: Jason Lane MRN: [id number] Date of Birth: October 21, 1927   Medicare Important Message Given:  Yes    Kerin Salen 02/20/2018, 12:58 [place] Message  Patient Details  Name: Jason Lane MRN: [id number] Date of Birth: Mar 13, 1928   Medicare Important Message Given:  Yes    Kerin Salen 02/20/2018, 12:58 PM

## 2018-02-20 NOTE — Progress Notes (Signed)
Physical Therapy Treatment Patient Details Name: Jason Lane MRN: [id number] DOB: November 19, 1927 Today's Date: 02/20/2018    History of Present Illness Jason Lane is a 84 y.o. male with medical history significant of asthma, BPH, GERD, HTN and neuropathy presents with weakness and falls.  Found to be pancytopenic, neutropenic, and underwent transfusion for hemoglobin of 4 upon admission.  He was found down at home and positive for rhabdomyolysis.     PT Comments    Pt assisted with ambulating in hallway and has improved cognition and mobility this session compared to previous visit.  Pt very pleasant and cooperative. Pt continues to require at least min assist so continue to recommend SNF upon d/c.   Follow Up Recommendations  SNF;Supervision/Assistance - 24 hour     Equipment Recommendations  Rolling walker with 5" wheels    Recommendations for Other Services       Precautions / Restrictions Precautions Precautions: Fall    Mobility  Bed Mobility Overal bed mobility: Needs Assistance Bed Mobility: Supine to Sit     Supine to sit: Min assist;HOB elevated     General bed mobility comments: verbal cues for technique, assist for trunk  Transfers Overall transfer level: Needs assistance Equipment used: Rolling walker (2 wheeled) Transfers: Sit to/from Stand Sit to Stand: Min assist;+2 safety/equipment         General transfer comment: verbal cues for hand placement  Ambulation/Gait Ambulation/Gait assistance: Min assist;+2 physical assistance Gait Distance (Feet): 60 Feet Assistive device: Rolling walker (2 wheeled) Gait Pattern/deviations: Step-through pattern;Decreased stride length     General Gait Details: reliant on RW at this time, verbal cues for posture, distance limited by BM, recliner behind pt for safety so taken back to room and assisted with hygiene   Stairs             Wheelchair Mobility    Modified Rankin (Stroke Patients Only)        Balance                                            Cognition Arousal/Alertness: Awake/alert Behavior During Therapy: WFL for tasks assessed/performed Overall Cognitive Status: Within Functional Limits for tasks assessed                                        Exercises      General Comments        Pertinent Vitals/Pain Pain Assessment: No/denies pain Pain Intervention(s): Repositioned;Monitored during session    Home Living                      Prior Function            PT Goals (current goals can now be found in the care plan section) Progress towards PT goals: Progressing toward goals    Frequency    Min 2X/week      PT Plan Current plan remains appropriate    Co-evaluation              AM-PAC PT "6 Clicks" Daily Activity  Outcome Measure  Difficulty turning over in bed (including adjusting bedclothes, sheets and blankets)?: A Little Difficulty moving from lying on back to sitting on the side of the bed? : A Lot Difficulty sitting down  on and standing up from a chair with arms (e.g., wheelchair, bedside commode, etc,.)?: Unable Help needed moving to and from a bed to chair (including a wheelchair)?: A Little Help needed walking in hospital room?: A Little Help needed climbing 3-5 steps with a railing? : A Lot 6 Click Score: 14    End of Session Equipment Utilized During Treatment: Gait belt Activity Tolerance: Patient tolerated treatment well Patient left: with call bell/phone within reach;in chair Nurse Communication: Mobility status(pt on chair alarm pad however no box in room and RN aware) PT Visit Diagnosis: Other abnormalities of gait and mobility (R26.89);History of falling (Z91.81);Muscle weakness (generalized) (M62.81)     Time: 0165-5374 PT Time Calculation (min) (ACUTE ONLY): 22 min  Charges:  $Gait Training: 8-22 mins                    Carmelia Bake, PT, DPT Acute Rehabilitation  Services Office: [phone] Pager: [phone]  Trena Platt 02/20/2018, 1:46 PM

## 2018-02-20 NOTE — Progress Notes (Signed)
This shift pt  Alert and oriented with no signs of agitation or combativeness. Very good appetite and slept well. Will continue to monitor.

## 2018-02-20 NOTE — Progress Notes (Signed)
Hematology oncology  Pathology confirmed that the blasts represent acute leukemia. There were 60% blasts by flow cytometry that were CD34 positive, CD13 positive and HLA-DR positive.  Myeloperoxidase stain was negative.  I do not recommend any further procedures like bone marrow biopsies are any treatment given his advanced age. Please consult hospice care.

## 2018-02-20 NOTE — Evaluation (Signed)
Occupational Therapy Evaluation Patient Details Name: Jason Lane MRN: [id number] DOB: [birth year] Today's Date: 02/20/2018    History of Present Illness Bach Rocchi is a 84 y.o. male with medical history significant of asthma, BPH, GERD, HTN and neuropathy presents with weakness and falls.  Found to be pancytopenic, neutropenic, and underwent transfusion for hemoglobin of 4 upon admission.  He was found down at home and positive for rhabdomyolysis.    Clinical Impression   Pt admitted with the above. Pt currently with functional limitations due to the deficits listed below (see OT Problem List).  Pt will benefit from skilled OT to increase their safety and independence with ADL and functional mobility for ADL to facilitate discharge to venue listed below.      Follow Up Recommendations  SNF    Equipment Recommendations  None recommended by OT    Recommendations for Other Services       Precautions / Restrictions Precautions Precautions: Fall      Mobility Bed Mobility Overal bed mobility: Needs Assistance Bed Mobility: Supine to Sit Rolling: Min assist   Supine to sit: Min assist;HOB elevated     General bed mobility comments: verbal cues for technique, assist for trunk  Transfers Overall transfer level: Needs assistance Equipment used: Rolling walker (2 wheeled) Transfers: Sit to/from Stand Sit to Stand: Min assist;+2 safety/equipment         General transfer comment: verbal cues for hand placement    Balance Overall balance assessment: Needs assistance   Sitting balance-Leahy Scale: Poor Sitting balance - Comments: seated EOB initially needed min a for balance, after standing more alert sat with S   Standing balance support: Bilateral upper extremity supported Standing balance-Leahy Scale: Poor Standing balance comment: needs mod A of 2 for safety in standing with walker due to weakness/lethargy                           ADL either  performed or assessed with clinical judgement   ADL Overall ADL's : Needs assistance/impaired     Grooming: Moderate assistance;Bed level Grooming Details (indicate cue type and reason): HOB raised Upper Body Bathing: Minimal assistance;Sitting   Lower Body Bathing: Moderate assistance;Maximal assistance;Sit to/from stand                               Vision Patient Visual Report: No change from baseline              Pertinent Vitals/Pain Pain Assessment: No/denies pain Pain Intervention(s): Repositioned;Monitored during session     Hand Dominance     Extremity/Trunk Assessment Upper Extremity Assessment Upper Extremity Assessment: Generalized weakness           Communication Communication Communication: Other (comment)(garbaled speech due to lethargy)   Cognition Arousal/Alertness: Awake/alert Behavior During Therapy: WFL for tasks assessed/performed Overall Cognitive Status: Within Functional Limits for tasks assessed                                                Home Living Family/patient expects to be discharged to:: Skilled nursing facility Living Arrangements: Alone   Type of Home: Apartment Home Access: Level entry     Home Layout: One level  OT Problem List: Decreased strength;Decreased activity tolerance;Decreased safety awareness;Impaired balance (sitting and/or standing)      OT Treatment/Interventions: Self-care/ADL training;Patient/family education;DME and/or AE instruction;Therapeutic activities    OT Goals(Current goals can be found in the care plan section) Acute Rehab OT Goals Patient Stated Goal: unable to state OT Goal Formulation: With patient Time For Goal Achievement: 02/27/18 Potential to Achieve Goals: Good  OT Frequency: Min 2X/week   Barriers to D/C: Decreased caregiver support             AM-PAC PT "6 Clicks" Daily Activity     Outcome  Measure Help from another person eating meals?: A Little Help from another person taking care of personal grooming?: A Little Help from another person toileting, which includes using toliet, bedpan, or urinal?: A Lot Help from another person bathing (including washing, rinsing, drying)?: A Lot Help from another person to put on and taking off regular upper body clothing?: A Little Help from another person to put on and taking off regular lower body clothing?: A Lot 6 Click Score: 15   End of Session Equipment Utilized During Treatment: Rolling walker Nurse Communication: Mobility status  Activity Tolerance: Patient tolerated treatment well Patient left: in bed;with call bell/phone within reach;with bed alarm set  OT Visit Diagnosis: Unsteadiness on feet (R26.81);Muscle weakness (generalized) (M62.81);History of falling (Z91.81);Other abnormalities of gait and mobility (R26.89);Repeated falls (R29.6)                Time: 2703-5009 OT Time Calculation (min): 15 min Charges:  OT General Charges $OT Visit: 1 Visit OT Evaluation $OT Eval Moderate Complexity: 1 Mod  Kari Baars, [place] [phone] Office- [phone], Edwena Felty D 02/20/2018, 3:33 PM

## 2018-02-20 NOTE — Progress Notes (Signed)
PROGRESS NOTE    Jason Lane  JKD:[id number] DOB: 1927-07-24 DOA: 02/23/2018 PCP: Biagio Borg, MD   Brief Narrative: Jason Lane is a 84 y.o. male with a history of neuropathy.  Patient presented secondary to several weeks of increasing weakness and was found down.  Upon admission, he was found to be pancytopenic, neutropenic, and with evidence of acute heart failure. Concern for acute leukemia. Hematology/oncology on board. Plan for either hospice or SNF.   Assessment & Plan:   Principal Problem:   Pancytopenia ([place]) Active Problems:   Essential hypertension   Severe anemia   Rhabdomyolysis   Pulmonary edema   Elevated troponin mild   Palliative care by specialist   Goals of care, counseling/discussion   Pancytopenia Neutropenia Unknown etiology.  No recent infections.  No medications at home per patient.  Patient has a history of melanoma that was cured via excision.  Family history of colon cancer in his dad. Associated macrocytosis. Hemoglobin of 4.1 on admission. S/p 4 units of PRBC. LDH elevated. Bilirubin slightly elevated but trended down. Haptoglobin normal. Platelets and hemoglobin stable. WBC normal. -Daily CBC -Hematology/oncology recommendations: Flow cytometry, likely hospice if confirmation of acute leukemia, palliative care consult -Watch for bleeding  Pulmonary edema No evidence of heart failure on echocardiogram.  Rhabdomyolysis From immobility. CK of 2394 on admission. IV fluids held secondary to concern for acute heart failure. Resolved.  Elevated troponin No chest pain. Troponin with a flat trend. In setting of rhabdomyolysis.  Sinus bradycardia Patient with an episode of fall with syncope. Unsure if related to heart rhythm vs anemia.  Hypernatremia Likely secondary to NS IV fluids. Resolved.   DVT prophylaxis: SCDs Code Status:   Code Status: Full Code Family Communication: None at bedside Disposition Plan: Discharge likely to hospice vs  SNF pending definitive diagnosis of leukemia and palliative care discussions. Patient without family. CM and CSW consulted.   Consultants:   Hematology/oncology  Procedures:   None  Antimicrobials:  Ceftriaxone  Azithromycin   Subjective: No concerns today.  Objective: Vitals:   02/19/18 0521 02/19/18 1355 02/19/18 2055 02/20/18 0627  BP: (!) 122/53 (!) 148/88 (!) 142/63 (!) 158/85  Pulse: 62 60 68 65  Resp: 19 16 15 18   Temp: 98.3 F (36.8 C) 97.9 F (36.6 C) 98.4 F (36.9 C) 98.4 F (36.9 C)  TempSrc: Oral Oral Oral Oral  SpO2: 95% 98% 94% 99%  Weight:      Height:        Intake/Output Summary (Last 24 hours) at 02/20/2018 0931 Last data filed at 02/20/2018 0906 Gross per 24 hour  Intake 840 ml  Output 1700 ml  Net -860 ml   Filed Weights   02/03/2018 2059  Weight: 57 kg    Examination:  General exam: Appears calm and comfortable Respiratory system: Clear to auscultation. Respiratory effort normal. Cardiovascular system: S1 & S2 heard, RRR. No murmurs, rubs, gallops or clicks. Gastrointestinal system: Abdomen is nondistended, soft and nontender. Normal bowel sounds heard. Central nervous system: Alert and oriented to person and place. Tremor. Extremities: No edema. No calf tenderness Skin: No cyanosis. No rashes Psychiatry: Flat affect   Data Reviewed: I have personally reviewed following labs and imaging studies  CBC: Recent Labs  Lab 02/03/2018 1718 02/17/18 0418 02/17/18 1331 02/18/18 0614 02/19/18 0520 02/20/18 0541  WBC 3.4* 3.5* 4.0 5.5 5.3 6.8  NEUTROABS 0.3*  --   --   --   --   --   HGB 4.1*  6.2* 9.0* 8.9* 8.6* 9.1*  HCT 13.1* 20.0* 27.7* 26.9* 25.9* 27.6*  MCV 119.1* 105.3* 100.7* 99.3 98.5 98.6  PLT 42* 40* 42* 37* 34* 35*   Basic Metabolic Panel: Recent Labs  Lab 02/05/2018 1637 02/17/18 0418 02/18/18 0614 02/20/18 0541  NA 142 147* 139 137  K 4.0 3.2* 3.3* 3.4*  CL 113* 112* 110 107  CO2 19* 19* 19* 23  GLUCOSE 113*  79 104* 111*  BUN 42* 37* 30* 24*  CREATININE 0.99 1.07 0.98 0.86  CALCIUM 8.2* 8.2* 7.8* 7.8*   GFR: Estimated Creatinine Clearance: 47.9 mL/min (by C-G formula based on SCr of 0.86 mg/dL). Liver Function Tests: Recent Labs  Lab 02/01/2018 1637 02/17/18 0418 02/17/18 1331 02/18/18 0614  AST 101* 90*  --  81*  ALT 33 31  --  33  ALKPHOS 75 71  --  72  BILITOT 1.8* 1.6* 1.4* 1.1  PROT 5.7* 5.6*  --  5.5*  ALBUMIN 3.4* 3.2*  --  3.1*   No results for input(s): LIPASE, AMYLASE in the last 168 hours. No results for input(s): AMMONIA in the last 168 hours. Coagulation Profile: Recent Labs  Lab 02/27/2018 1637  INR 1.34   Cardiac Enzymes: Recent Labs  Lab 01/31/2018 1637 02/17/18 1331 02/17/18 1846 02/17/18 2315 02/20/18 0541  CKTOTAL 2,394*  --   --   --  229  TROPONINI  --  0.03* 0.03* 0.03*  --    BNP (last 3 results) No results for input(s): PROBNP in the last 8760 hours. HbA1C: No results for input(s): HGBA1C in the last 72 hours. CBG: No results for input(s): GLUCAP in the last 168 hours. Lipid Profile: No results for input(s): CHOL, HDL, LDLCALC, TRIG, CHOLHDL, LDLDIRECT in the last 72 hours. Thyroid Function Tests: No results for input(s): TSH, T4TOTAL, FREET4, T3FREE, THYROIDAB in the last 72 hours. Anemia Panel: No results for input(s): VITAMINB12, FOLATE, FERRITIN, TIBC, IRON, RETICCTPCT in the last 72 hours. Sepsis Labs: No results for input(s): PROCALCITON, LATICACIDVEN in the last 168 hours.  Recent Results (from the past 240 hour(s))  Urine culture     Status: Abnormal   Collection Time: 02/26/2018  5:55 PM  Result Value Ref Range Status   Specimen Description   Final    URINE, RANDOM Performed at [facility] [address], [place], [place] [address]    Special Requests   Final    NONE Performed at [facility], [place] [address] [place], [place] [address]    Culture MULTIPLE SPECIES PRESENT, SUGGEST  RECOLLECTION (A)  Final   Report Status 02/18/2018 FINAL  Final  MRSA PCR Screening     Status: None   Collection Time: 01/31/2018  8:17 PM  Result Value Ref Range Status   MRSA by PCR NEGATIVE NEGATIVE Final    Comment:        The GeneXpert MRSA Assay (FDA approved for NASAL specimens only), is one component of a comprehensive MRSA colonization surveillance program. It is not intended to diagnose MRSA infection nor to guide or monitor treatment for MRSA infections. Performed at [facility], [place] [address] [place], [place] [address]          Radiology Studies: No results found.      Scheduled Meds: . sodium chloride   Intravenous Once  . sodium chloride   Intravenous Once   Continuous Infusions:   LOS: 4 days     Cordelia Poche, MD [facility]  02/20/2018, 9:31 AM  If 7PM-7AM, please contact night-coverage [email or web address]

## 2018-02-21 MED ORDER — HYDRALAZINE HCL 20 MG/ML IJ SOLN
10.0000 mg | Freq: Once | INTRAMUSCULAR | Status: AC
  Administered 2018-02-21: 10 mg via INTRAVENOUS
  Filled 2018-02-21: qty 1

## 2018-02-21 NOTE — Progress Notes (Signed)
Pt had a visitor stop by this evening and stated that she cares for the pt in his home. Lattie [phone]. Pt agreeable to contact.

## 2018-02-21 NOTE — Clinical Social Work Note (Signed)
Clinical Social Work Assessment  Patient Details  Name: Jason Lane MRN: [id number] Date of Birth: 30-Mar-1928  Date of referral:  02/21/18               Reason for consult:  End of Life/Hospice, Discharge Planning(pt with terminal illness, no support system)                Permission sought to share information with:    Permission granted to share information::     Name::        Agency::     Relationship::     Contact Information:     Housing/Transportation Living arrangements for the past 2 months:  Single Family Home Source of Information:  Patient, Medical Team(chart review) Patient Interpreter Needed:  None Criminal Activity/Legal Involvement Pertinent to Current Situation/Hospitalization:  No - Comment as needed Significant Relationships:  None Lives with:  Self Do you feel safe going back to the place where you live?  Yes Need for family participation in patient care:  (n/a no family)  Care giving concerns:  Pt admitted from home where he resides alone. Admitted after coming to ED via EMS for worsening weakness (presumed fall), and findings were consistent with rhabdomyolysis.  Pt states he lives alone and has not family/friends. Used a cane which used to belong to his sister. Pt states he was independent up until a few months ago, could not elaborate. CSW review of chart showed pt had Outpatient PT in April of this year after presenting to PCP with increased weakness/dizziness. States he has no major medical history.  During this hospitalization has been found to have leukemia, no treatment options.  Social Worker assessment / plan:  CSW consulted to assist with pt's disposition as he has been diagnosed with terminal illness and has no identified support system. Met with pt at bedside and discussed brief hx and care needs above. Pt was slow to respond at times but did respond appropriately to questions. Did not discuss treatment here at length so could not gauge how oriented he  is to situation, however he did seem to understand that it is being recommended he not return home alone and that he could benefit from hospice/palliative care and from rehab at SNF if his goal is to rehab and return home. (pt stated he prefers to go home at DC but also admitted to [facility] he does not have support there and that he is not able to be safe if he returns in state prior to admission). Pt was oriented except to time (stated date was Oct 20th rather than Oct 22). Review of chart showed no hx of cognitive issues. Pt's slow responses at times prompted CSW to ask teach back questions so he could have opportunity to demonstrate understanding of this conversation. He did answer appropriately if not elaborately.  Pt did agree that "if he needs to go to a rehab that is okay." CSW inquired as to interest in hospice/palliative services (has been discussing with PMT) however pt was unable to agree or disagree, did not really engage. This needs to be investigated further if pt understands differences between rehab goals and hospice- centered goals.   CSW will follow to assist pt with disposition. Need to pursue SNF if pt decides, otherwise refer to hospice  Employment status:  Retired Forensic scientist:  Medicare PT Recommendations:  [place], [place] / Referral to community resources:     Patient/Family's Response to care:  Pt  thanked CSW for visit  Patient/Family's Understanding of and Emotional Response to Diagnosis, Current Treatment, and Prognosis:  See above. Emotionally very pleasant but not demonstrative.   Emotional Assessment Appearance:  Appears stated age Attitude/Demeanor/Rapport:  Engaged Affect (typically observed):  Calm, Pleasant Orientation:  Oriented to Self, Oriented to Place, Oriented to Situation(see narrative for description) Alcohol / Substance use:  Not Applicable Psych involvement (Current and /or in the community):  No  (Comment)  Discharge Needs  Concerns to be addressed:  Discharge Planning Concerns, Care Coordination, Decision making concerns Readmission within the last 30 days:  No Current discharge risk:  (assessing) Barriers to Discharge:      Nila Nephew, LCSW 02/21/2018, 11:29 AM  [phone]

## 2018-02-21 NOTE — Progress Notes (Signed)
PROGRESS NOTE    Jason Lane  ALP:[id number] DOB: Oct 08, 1927 DOA: 02/11/2018 PCP: Biagio Borg, MD   Brief Narrative: Jason Lane is a 84 y.o. male with a history of neuropathy.  Patient presented secondary to several weeks of increasing weakness and was found down.  Upon admission, he was found to be pancytopenic, neutropenic, and with evidence of acute heart failure. Concern for acute leukemia. Hematology/oncology on board. Plan for either hospice or SNF.   Assessment & Plan:   Principal Problem:   Pancytopenia ([place]) Active Problems:   Essential hypertension   Severe anemia   Rhabdomyolysis   Pulmonary edema   Elevated troponin mild   Palliative care by specialist   Goals of care, counseling/discussion   Pancytopenia Neutropenia Unknown etiology.  No recent infections.  No medications at home per patient.  Patient has a history of melanoma that was cured via excision.  Family history of colon cancer in his dad. Associated macrocytosis. Hemoglobin of 4.1 on admission. S/p 4 units of PRBC. LDH elevated. Bilirubin slightly elevated but trended down. Haptoglobin normal. Platelets and hemoglobin stable. WBC normal. -Daily CBC -Hematology/oncology recommendations: Hospice. Flow cytometry confirms acute leukemia -Watch for bleeding  Pulmonary edema No evidence of heart failure on echocardiogram.  Rhabdomyolysis From immobility. CK of 2394 on admission. IV fluids held secondary to concern for acute heart failure. Resolved.  Elevated troponin No chest pain. Troponin with a flat trend. In setting of rhabdomyolysis.  Sinus bradycardia Patient with an episode of fall with syncope. Unsure if related to heart rhythm vs anemia.  Hypernatremia Likely secondary to NS IV fluids. Resolved.   DVT prophylaxis: SCDs Code Status:   Code Status: DNR Family Communication: None at bedside Disposition Plan: Discharge likely to hospice vs SNF pending definitive diagnosis of leukemia  and palliative care discussions. Patient without family. CM and CSW consulted.   Consultants:   Hematology/oncology  Procedures:   None  Antimicrobials:  Ceftriaxone  Azithromycin   Subjective: No issues overnight  Objective: Vitals:   02/20/18 0627 02/20/18 1418 02/20/18 2152 02/21/18 0357  BP: (!) 158/85 (!) 142/89 (!) 158/81 (!) 159/87  Pulse: 65 71 78 65  Resp: 18  18 20   Temp: 98.4 F (36.9 C) (!) 97.4 F (36.3 C) 98.1 F (36.7 C) (!) 97.5 F (36.4 C)  TempSrc: Oral Oral    SpO2: 99% 98% 98% 97%  Weight:      Height:        Intake/Output Summary (Last 24 hours) at 02/21/2018 1433 Last data filed at 02/21/2018 1008 Gross per 24 hour  Intake 480 ml  Output 250 ml  Net 230 ml   Filed Weights   03/01/2018 2059  Weight: 57 kg    Examination:  General: Well appearing, no distress   Data Reviewed: I have personally reviewed following labs and imaging studies  CBC: Recent Labs  Lab 02/15/2018 1718 02/17/18 0418 02/17/18 1331 02/18/18 0614 02/19/18 0520 02/20/18 0541  WBC 3.4* 3.5* 4.0 5.5 5.3 6.8  NEUTROABS 0.3* THIS TEST WAS ORDERED IN ERROR AND HAS BEEN CREDITED.  --   --   --   --   HGB 4.1* 6.2* 9.0* 8.9* 8.6* 9.1*  HCT 13.1* 20.0* 27.7* 26.9* 25.9* 27.6*  MCV 119.1* 105.3* 100.7* 99.3 98.5 98.6  PLT 42* 40* 42* 37* 34* 35*   Basic Metabolic Panel: Recent Labs  Lab 02/03/2018 1637 02/17/18 0418 02/18/18 0614 02/20/18 0541  NA 142 147* 139 137  K 4.0 3.2* 3.3*  3.4*  CL 113* 112* 110 107  CO2 19* 19* 19* 23  GLUCOSE 113* 79 104* 111*  BUN 42* 37* 30* 24*  CREATININE 0.99 1.07 0.98 0.86  CALCIUM 8.2* 8.2* 7.8* 7.8*   GFR: Estimated Creatinine Clearance: 47.9 mL/min (by C-G formula based on SCr of 0.86 mg/dL). Liver Function Tests: Recent Labs  Lab 03/02/2018 1637 02/17/18 0418 02/17/18 1331 02/18/18 0614  AST 101* 90*  --  81*  ALT 33 31  --  33  ALKPHOS 75 71  --  72  BILITOT 1.8* 1.6* 1.4* 1.1  PROT 5.7* 5.6*  --  5.5*    ALBUMIN 3.4* 3.2*  --  3.1*   No results for input(s): LIPASE, AMYLASE in the last 168 hours. No results for input(s): AMMONIA in the last 168 hours. Coagulation Profile: Recent Labs  Lab 02/24/2018 1637  INR 1.34   Cardiac Enzymes: Recent Labs  Lab 02/17/2018 1637 02/17/18 1331 02/17/18 1846 02/17/18 2315 02/20/18 0541  CKTOTAL 2,394*  --   --   --  229  TROPONINI  --  0.03* 0.03* 0.03*  --    BNP (last 3 results) No results for input(s): PROBNP in the last 8760 hours. HbA1C: No results for input(s): HGBA1C in the last 72 hours. CBG: No results for input(s): GLUCAP in the last 168 hours. Lipid Profile: No results for input(s): CHOL, HDL, LDLCALC, TRIG, CHOLHDL, LDLDIRECT in the last 72 hours. Thyroid Function Tests: No results for input(s): TSH, T4TOTAL, FREET4, T3FREE, THYROIDAB in the last 72 hours. Anemia Panel: No results for input(s): VITAMINB12, FOLATE, FERRITIN, TIBC, IRON, RETICCTPCT in the last 72 hours. Sepsis Labs: No results for input(s): PROCALCITON, LATICACIDVEN in the last 168 hours.  Recent Results (from the past 240 hour(s))  Urine culture     Status: Abnormal   Collection Time: 02/06/2018  5:55 PM  Result Value Ref Range Status   Specimen Description   Final    URINE, RANDOM Performed at [facility] [address], [place], [place] [address]    Special Requests   Final    NONE Performed at [facility], [place] [address] [place], [place] [address]    Culture MULTIPLE SPECIES PRESENT, SUGGEST RECOLLECTION (A)  Final   Report Status 02/18/2018 FINAL  Final  MRSA PCR Screening     Status: None   Collection Time: 02/21/2018  8:17 PM  Result Value Ref Range Status   MRSA by PCR NEGATIVE NEGATIVE Final    Comment:        The GeneXpert MRSA Assay (FDA approved for NASAL specimens only), is one component of a comprehensive MRSA colonization surveillance program. It is not intended to diagnose MRSA infection nor to  guide or monitor treatment for MRSA infections. Performed at [facility], [place] [address] [place], [place] [address]          Radiology Studies: No results found.      Scheduled Meds: . sodium chloride   Intravenous Once  . sodium chloride   Intravenous Once   Continuous Infusions:   LOS: 5 days     Cordelia Poche, MD [facility] 02/21/2018, 2:33 PM  If 7PM-7AM, please contact night-coverage [email or web address]

## 2018-02-21 NOTE — Progress Notes (Signed)
Daily Progress Note   Patient Name: Jason Lane       Date: 02/21/2018 DOB: [birth year]  Age: 84 y.o. MRN#: [id number] Attending Physician: Mariel Aloe, MD Primary Care Physician: Biagio Borg, MD Admit Date: 02/11/2018  Reason for Consultation/Follow-up: Establishing goals of care  Subjective: Sitting in bedside chair.  Ate all of his dinner this evening.    Reports that he is feeling "OK."  Discussed his understanding of situation.  While he cannot remember name of his actual diagnosis (acute leukemia), he reports that he knows that time is short and he is not going to recover from his illness overall, but he wants to work to rehab to see if he can get strong enough to return to his home.  See below   Length of Stay: 5  Current Medications: Scheduled Meds:  . sodium chloride   Intravenous Once  . sodium chloride   Intravenous Once    Continuous Infusions:   PRN Meds: acetaminophen **OR** acetaminophen, haloperidol lactate, ondansetron **OR** ondansetron (ZOFRAN) IV, polyethylene glycol  Physical Exam         General exam: Appears calm and comfortable Respiratory system: Clear to auscultation. Respiratory effort normal. Cardiovascular system: S1 & S2 heard, RRR. No murmurs, rubs, gallops or clicks. Gastrointestinal system: Abdomen is nondistended, soft and nontender. Normal bowel sounds heard. Central nervous system: Alert and oriented to person and place. Tremor. Extremities: No edema. No calf tenderness Skin: No cyanosis. No rashes   Vital Signs: BP (!) 147/60   Pulse 66   Temp (!) 97.5 F (36.4 C)   Resp 20   Ht 5\' 8"  (1.727 m)   Wt 57 kg   SpO2 97%   BMI 19.11 kg/m  SpO2: SpO2: 97 % O2 Device: O2 Device: Room Air O2 Flow Rate:    Intake/output summary:    Intake/Output Summary (Last 24 hours) at 02/21/2018 2210 Last data filed at 02/21/2018 1858 Gross per 24 hour  Intake 480 ml  Output 250 ml  Net 230 ml   LBM: Last BM Date: 02/20/18 Baseline Weight: Weight: 57 kg Most recent weight: Weight: 57 kg       Palliative Assessment/Data:    Flowsheet Rows     Most Recent Value  Intake Tab  Referral Department  Hospitalist  Unit at Time  of Referral  ICU  Palliative Care Primary Diagnosis  Cancer  Date Notified  02/18/18  Palliative Care Type  New Palliative care  Reason for referral  Clarify Goals of Care  Date of Admission  02/15/2018  Date first seen by Palliative Care  02/19/18  # of days Palliative referral response time  1 Day(s)  # of days IP prior to Palliative referral  2  Clinical Assessment  Psychosocial & Spiritual Assessment  Palliative Care Outcomes      Patient Active Problem List   Diagnosis Date Noted  . Palliative care by specialist   . Goals of care, counseling/discussion   . Pancytopenia ([place]) 02/19/2018  . Severe anemia 02/25/2018  . Rhabdomyolysis 02/09/2018  . Pulmonary edema 02/22/2018  . Elevated troponin mild 02/28/2018  . Gait disorder 08/01/2017  . BPPV (benign paroxysmal positional vertigo) 08/01/2017  . Gross hematuria 03/30/2017  . BRBPR (bright red blood per rectum) 03/30/2017  . Rotator cuff tear arthropathy of both shoulders 11/30/2016  . Hyperglycemia 06/29/2016  . S/P laparoscopic hernia repair 06/24/2015  . Inguinal hernia, left 05/27/2015  . Skin lesion 05/27/2015  . Bradycardia 12/24/2014  . Chest pain 06/23/2012  . Peripheral neuropathy 12/19/2011  . Hematochezia 12/15/2011  . Leg cramps 12/15/2011  . Bladder neck obstruction 12/15/2011  . Vertigo 09/11/2011  . Preventative health care 09/09/2010  . ANEMIA-IRON DEFICIENCY 07/08/2010  . Essential hypertension 02/01/2010  . Hyperlipidemia 12/03/2009  . Asthma 12/03/2009  . GERD 12/03/2009  . DIVERTICULOSIS, COLON  12/03/2009  . BPH associated with nocturia 12/03/2009  . FATIGUE 12/03/2009  . TREMOR 12/03/2009  . MALIGNANT MELANOMA, HX OF 12/03/2009  . TOBACCO USE, QUIT 12/03/2009    Palliative Care Assessment & Plan   Patient Profile:    Assessment:  84 year old gentleman with severe fatigue weakness and falls and neuropathy. Patient was admitted to the hospital with feeling low hemoglobin of 4 g/dL. In addition he was found to have neutropenia, pancytopenia. He was seen and evaluated by hematology oncology and is undergoing evaluation. There remains a high suspicion for possibility of acute leukemia.  Palliative consultation for goals of care discussions.   Recommendations/Plan:  Discussed with patient about SNF rehab with palliative, and eventually, him needing hospice services.  He would like to pursue SNF at this time with hope that he may be able to regain enough functional status to return home (at least for a period of time).   PMT will continue to follow.      Code Status:    Code Status Orders  (From admission, onward)         Start     Ordered   02/20/18 1114  Do not attempt resuscitation (DNR)  Continuous    Question Answer Comment  In the event of cardiac or respiratory ARREST Do not call a "code blue"   In the event of cardiac or respiratory ARREST Do not perform Intubation, CPR, defibrillation or ACLS   In the event of cardiac or respiratory ARREST Use medication by any route, position, wound care, and other measures to relive pain and suffering. May use oxygen, suction and manual treatment of airway obstruction as needed for comfort.      02/20/18 1113        Code Status History    Date Active Date Inactive Code Status Order ID Comments User Context   02/20/2018 2049 02/20/2018 1113 Full Code [id number]  Johnson-Pitts, Endia, MD Inpatient       Prognosis:  <  3 months   Discharge Planning:  [place] for rehab with Palliative care service  follow-up  Care plan was discussed with patient.   Thank you for allowing the Palliative Medicine Team to assist in the care of this patient.   Total Time 25 Prolonged Time Billed  no       Greater than 50%  of this time was spent counseling and coordinating care related to the above assessment and plan.  Micheline Rough, MD  Please contact Palliative Medicine Team phone at [phone] for questions and concerns.

## 2018-02-21 NOTE — Progress Notes (Signed)
RN notified patient is showing signs of AFIB on monitor. HR in 70s. Patient is resting and asymptomatic. MD notified. Will continue to monitor.

## 2018-02-22 DIAGNOSIS — Z7401 Bed confinement status: Secondary | ICD-10-CM | POA: Diagnosis not present

## 2018-02-22 DIAGNOSIS — R05 Cough: Secondary | ICD-10-CM | POA: Diagnosis not present

## 2018-02-22 DIAGNOSIS — I10 Essential (primary) hypertension: Secondary | ICD-10-CM | POA: Diagnosis not present

## 2018-02-22 DIAGNOSIS — R41841 Cognitive communication deficit: Secondary | ICD-10-CM | POA: Diagnosis present

## 2018-02-22 DIAGNOSIS — R0689 Other abnormalities of breathing: Secondary | ICD-10-CM | POA: Diagnosis not present

## 2018-02-22 DIAGNOSIS — D61818 Other pancytopenia: Secondary | ICD-10-CM | POA: Diagnosis not present

## 2018-02-22 DIAGNOSIS — R4020 Unspecified coma: Secondary | ICD-10-CM | POA: Diagnosis not present

## 2018-02-22 DIAGNOSIS — R7989 Other specified abnormal findings of blood chemistry: Secondary | ICD-10-CM

## 2018-02-22 DIAGNOSIS — E876 Hypokalemia: Secondary | ICD-10-CM | POA: Diagnosis present

## 2018-02-22 DIAGNOSIS — R404 Transient alteration of awareness: Secondary | ICD-10-CM | POA: Diagnosis not present

## 2018-02-22 DIAGNOSIS — R5381 Other malaise: Secondary | ICD-10-CM | POA: Diagnosis not present

## 2018-02-22 DIAGNOSIS — R0989 Other specified symptoms and signs involving the circulatory and respiratory systems: Secondary | ICD-10-CM | POA: Diagnosis not present

## 2018-02-22 DIAGNOSIS — D696 Thrombocytopenia, unspecified: Secondary | ICD-10-CM | POA: Diagnosis not present

## 2018-02-22 DIAGNOSIS — Z7189 Other specified counseling: Secondary | ICD-10-CM | POA: Diagnosis not present

## 2018-02-22 DIAGNOSIS — J9601 Acute respiratory failure with hypoxia: Secondary | ICD-10-CM | POA: Diagnosis not present

## 2018-02-22 DIAGNOSIS — R531 Weakness: Secondary | ICD-10-CM | POA: Diagnosis present

## 2018-02-22 DIAGNOSIS — R351 Nocturia: Secondary | ICD-10-CM | POA: Diagnosis not present

## 2018-02-22 DIAGNOSIS — R6520 Severe sepsis without septic shock: Secondary | ICD-10-CM | POA: Diagnosis not present

## 2018-02-22 DIAGNOSIS — K5900 Constipation, unspecified: Secondary | ICD-10-CM | POA: Diagnosis present

## 2018-02-22 DIAGNOSIS — R899 Unspecified abnormal finding in specimens from other organs, systems and tissues: Secondary | ICD-10-CM | POA: Diagnosis not present

## 2018-02-22 DIAGNOSIS — A419 Sepsis, unspecified organism: Secondary | ICD-10-CM | POA: Diagnosis present

## 2018-02-22 DIAGNOSIS — Z85820 Personal history of malignant melanoma of skin: Secondary | ICD-10-CM | POA: Diagnosis not present

## 2018-02-22 DIAGNOSIS — R2689 Other abnormalities of gait and mobility: Secondary | ICD-10-CM | POA: Diagnosis present

## 2018-02-22 DIAGNOSIS — G629 Polyneuropathy, unspecified: Secondary | ICD-10-CM | POA: Diagnosis not present

## 2018-02-22 DIAGNOSIS — J15212 Pneumonia due to Methicillin resistant Staphylococcus aureus: Secondary | ICD-10-CM | POA: Diagnosis present

## 2018-02-22 DIAGNOSIS — Z23 Encounter for immunization: Secondary | ICD-10-CM | POA: Diagnosis not present

## 2018-02-22 DIAGNOSIS — E860 Dehydration: Secondary | ICD-10-CM | POA: Diagnosis present

## 2018-02-22 DIAGNOSIS — R0902 Hypoxemia: Secondary | ICD-10-CM | POA: Diagnosis not present

## 2018-02-22 DIAGNOSIS — G9341 Metabolic encephalopathy: Secondary | ICD-10-CM | POA: Diagnosis present

## 2018-02-22 DIAGNOSIS — D500 Iron deficiency anemia secondary to blood loss (chronic): Secondary | ICD-10-CM | POA: Diagnosis present

## 2018-02-22 DIAGNOSIS — J810 Acute pulmonary edema: Secondary | ICD-10-CM | POA: Diagnosis not present

## 2018-02-22 DIAGNOSIS — I509 Heart failure, unspecified: Secondary | ICD-10-CM | POA: Diagnosis not present

## 2018-02-22 DIAGNOSIS — I499 Cardiac arrhythmia, unspecified: Secondary | ICD-10-CM | POA: Diagnosis not present

## 2018-02-22 DIAGNOSIS — Z79899 Other long term (current) drug therapy: Secondary | ICD-10-CM | POA: Diagnosis not present

## 2018-02-22 DIAGNOSIS — N401 Enlarged prostate with lower urinary tract symptoms: Secondary | ICD-10-CM | POA: Diagnosis not present

## 2018-02-22 DIAGNOSIS — Z515 Encounter for palliative care: Secondary | ICD-10-CM | POA: Diagnosis not present

## 2018-02-22 DIAGNOSIS — I5032 Chronic diastolic (congestive) heart failure: Secondary | ICD-10-CM | POA: Diagnosis present

## 2018-02-22 DIAGNOSIS — N400 Enlarged prostate without lower urinary tract symptoms: Secondary | ICD-10-CM | POA: Diagnosis present

## 2018-02-22 DIAGNOSIS — R5382 Chronic fatigue, unspecified: Secondary | ICD-10-CM | POA: Diagnosis present

## 2018-02-22 DIAGNOSIS — N179 Acute kidney failure, unspecified: Secondary | ICD-10-CM | POA: Diagnosis present

## 2018-02-22 DIAGNOSIS — R0602 Shortness of breath: Secondary | ICD-10-CM | POA: Diagnosis not present

## 2018-02-22 DIAGNOSIS — E440 Moderate protein-calorie malnutrition: Secondary | ICD-10-CM | POA: Diagnosis present

## 2018-02-22 DIAGNOSIS — R4182 Altered mental status, unspecified: Secondary | ICD-10-CM | POA: Diagnosis not present

## 2018-02-22 DIAGNOSIS — D630 Anemia in neoplastic disease: Secondary | ICD-10-CM | POA: Diagnosis not present

## 2018-02-22 DIAGNOSIS — L89154 Pressure ulcer of sacral region, stage 4: Secondary | ICD-10-CM | POA: Diagnosis present

## 2018-02-22 DIAGNOSIS — M6281 Muscle weakness (generalized): Secondary | ICD-10-CM | POA: Diagnosis present

## 2018-02-22 DIAGNOSIS — M6282 Rhabdomyolysis: Secondary | ICD-10-CM

## 2018-02-22 DIAGNOSIS — J9811 Atelectasis: Secondary | ICD-10-CM | POA: Diagnosis not present

## 2018-02-22 DIAGNOSIS — E785 Hyperlipidemia, unspecified: Secondary | ICD-10-CM | POA: Diagnosis not present

## 2018-02-22 DIAGNOSIS — C9100 Acute lymphoblastic leukemia not having achieved remission: Secondary | ICD-10-CM | POA: Diagnosis not present

## 2018-02-22 DIAGNOSIS — I5083 High output heart failure: Secondary | ICD-10-CM | POA: Diagnosis not present

## 2018-02-22 DIAGNOSIS — R410 Disorientation, unspecified: Secondary | ICD-10-CM | POA: Diagnosis not present

## 2018-02-22 DIAGNOSIS — I5033 Acute on chronic diastolic (congestive) heart failure: Secondary | ICD-10-CM | POA: Diagnosis not present

## 2018-02-22 DIAGNOSIS — C9500 Acute leukemia of unspecified cell type not having achieved remission: Secondary | ICD-10-CM

## 2018-02-22 DIAGNOSIS — J45909 Unspecified asthma, uncomplicated: Secondary | ICD-10-CM | POA: Diagnosis present

## 2018-02-22 DIAGNOSIS — D649 Anemia, unspecified: Secondary | ICD-10-CM

## 2018-02-22 DIAGNOSIS — L89152 Pressure ulcer of sacral region, stage 2: Secondary | ICD-10-CM | POA: Diagnosis present

## 2018-02-22 DIAGNOSIS — E1149 Type 2 diabetes mellitus with other diabetic neurological complication: Secondary | ICD-10-CM | POA: Diagnosis present

## 2018-02-22 DIAGNOSIS — I2699 Other pulmonary embolism without acute cor pulmonale: Secondary | ICD-10-CM | POA: Diagnosis present

## 2018-02-22 DIAGNOSIS — R55 Syncope and collapse: Secondary | ICD-10-CM | POA: Diagnosis present

## 2018-02-22 DIAGNOSIS — M2550 Pain in unspecified joint: Secondary | ICD-10-CM | POA: Diagnosis not present

## 2018-02-22 DIAGNOSIS — Z87891 Personal history of nicotine dependence: Secondary | ICD-10-CM | POA: Diagnosis not present

## 2018-02-22 DIAGNOSIS — J811 Chronic pulmonary edema: Secondary | ICD-10-CM | POA: Diagnosis not present

## 2018-02-22 DIAGNOSIS — I110 Hypertensive heart disease with heart failure: Secondary | ICD-10-CM | POA: Diagnosis not present

## 2018-02-22 DIAGNOSIS — Z66 Do not resuscitate: Secondary | ICD-10-CM | POA: Diagnosis not present

## 2018-02-22 DIAGNOSIS — G9009 Other idiopathic peripheral autonomic neuropathy: Secondary | ICD-10-CM | POA: Diagnosis present

## 2018-02-22 LAB — BASIC METABOLIC PANEL
ANION GAP: 8 (ref 5–15)
BUN: 29 mg/dL — ABNORMAL HIGH (ref 8–23)
CHLORIDE: 108 mmol/L (ref 98–111)
CO2: 23 mmol/L (ref 22–32)
Calcium: 8.1 mg/dL — ABNORMAL LOW (ref 8.9–10.3)
Creatinine, Ser: 0.77 mg/dL (ref 0.61–1.24)
GFR calc Af Amer: >60 mL/min (ref >60)
GFR calc non Af Amer: >60 mL/min (ref >60)
Glucose, Bld: 94 mg/dL (ref 70–99)
POTASSIUM: 3.7 mmol/L (ref 3.5–5.1)
SODIUM: 139 mmol/L (ref 135–145)

## 2018-02-22 LAB — CBC
HCT: 26.7 % — ABNORMAL LOW (ref 39.0–52.0)
HEMOGLOBIN: 8.5 g/dL — AB (ref 13.0–17.0)
MCH: 32.1 pg (ref 26.0–34.0)
MCHC: 31.8 g/dL (ref 30.0–36.0)
MCV: 100.8 fL — AB (ref 80.0–100.0)
Platelets: 32 10*3/uL — ABNORMAL LOW (ref 150–400)
RBC: 2.65 MIL/uL — AB (ref 4.22–5.81)
RDW: 20.1 % — ABNORMAL HIGH (ref 11.5–15.5)
WBC: 11.2 10*3/uL — ABNORMAL HIGH (ref 4.0–10.5)

## 2018-02-22 MED ORDER — GABAPENTIN 100 MG PO CAPS: 100.0000 mg | ORAL_CAPSULE | Freq: Every day | ORAL | 0 refills | Status: AC

## 2018-02-22 NOTE — Progress Notes (Signed)
Pt selects bed offer at [facility]- admitting to room # 105, report #[phone].  Arranged PTAR transportation. Pt has no family but states he will inform his neighbor of transition to rehab.  Pt states he has no access to his clothes/shoes from home- requests scrub pants and shoes for transfer.  DC information provided via the [facility].  Sharren Bridge, MSW, LCSW Clinical Social Work 02/22/2018 [phone]

## 2018-02-22 NOTE — Care Management Note (Signed)
Case Management Note  Patient Details  Name: Jason Lane MRN: [id number] Date of Birth: July 03, 1927  Subjective/Objective:                  Discharged to home with self care  Action/Plan: Discharge to home with self care, orders checked for hhc needs. No CM needs present at time of discharge.  Expected Discharge Date:  02/22/18               Expected Discharge Plan:  Home/Self Care  In-House Referral:     Discharge planning Services  CM Consult  Post Acute Care Choice:    Choice offered to:     DME Arranged:    DME Agency:     HH Arranged:    HH Agency:     Status of Service:  Completed, signed off  If discussed at [employer] of Stay Meetings, dates discussed:    Additional Comments:  Leeroy Cha, RN 02/22/2018, 10:02 AM

## 2018-02-22 NOTE — NC FL2 (Signed)
[place] LEVEL OF CARE SCREENING TOOL     IDENTIFICATION  Patient Name: Jason Lane Birthdate: March 24, 1928 Sex: male Admission Date (Current Location): 02/17/2018  [facility] and [place] Number:  Herbalist and Address:  [facility],  [place], [place]      Provider Number: [id number]  Attending Physician Name and Address:  Charlynne Cousins, MD  Relative Name and Phone Number:       Current Level of Care: Hospital Recommended Level of Care: [place] Prior Approval Number:    Date Approved/Denied:   PASRR Number: [id number]  Discharge Plan: SNF    Current Diagnoses: Patient Active Problem List   Diagnosis Date Noted  . Acute leukemia ([place]) 02/22/2018  . Palliative care by specialist   . Goals of care, counseling/discussion   . Pancytopenia ([place]) 02/19/2018  . Severe anemia 02/09/2018  . Rhabdomyolysis 02/20/2018  . Pulmonary edema 02/08/2018  . Elevated troponin mild 02/08/2018  . Gait disorder 08/01/2017  . BPPV (benign paroxysmal positional vertigo) 08/01/2017  . Gross hematuria 03/30/2017  . BRBPR (bright red blood per rectum) 03/30/2017  . Rotator cuff tear arthropathy of both shoulders 11/30/2016  . Hyperglycemia 06/29/2016  . S/P laparoscopic hernia repair 06/24/2015  . Inguinal hernia, left 05/27/2015  . Skin lesion 05/27/2015  . Bradycardia 12/24/2014  . Chest pain 06/23/2012  . Peripheral neuropathy 12/19/2011  . Hematochezia 12/15/2011  . Leg cramps 12/15/2011  . Bladder neck obstruction 12/15/2011  . Vertigo 09/11/2011  . Preventative health care 09/09/2010  . ANEMIA-IRON DEFICIENCY 07/08/2010  . Essential hypertension 02/01/2010  . Hyperlipidemia 12/03/2009  . Asthma 12/03/2009  . GERD 12/03/2009  . DIVERTICULOSIS, COLON 12/03/2009  . BPH associated with nocturia 12/03/2009  . FATIGUE 12/03/2009  . TREMOR 12/03/2009  . MALIGNANT MELANOMA, HX OF 12/03/2009  . TOBACCO  USE, QUIT 12/03/2009    Orientation RESPIRATION BLADDER Height & Weight     Self, Time, Situation, Place  Normal Continent, External catheter Weight: 125 lb 10.6 oz (57 kg) Height:  5\' 8"  (172.7 cm)  BEHAVIORAL SYMPTOMS/MOOD NEUROLOGICAL BOWEL NUTRITION STATUS      Continent Diet(low sodium heart healthy diet)  AMBULATORY STATUS COMMUNICATION OF NEEDS Skin   Extensive Assist Verbally Normal                       Personal Care Assistance Level of Assistance  Bathing, Feeding, Dressing Bathing Assistance: Maximum assistance Feeding assistance: Independent Dressing Assistance: Maximum assistance     Functional Limitations Info  Sight, Hearing, Speech Sight Info: Adequate Hearing Info: Adequate Speech Info: Adequate    SPECIAL CARE FACTORS FREQUENCY  PT (By licensed PT), OT (By licensed OT)     PT Frequency: 5x OT Frequency: 3x            Contractures Contractures Info: Not present    Additional Factors Info  Code Status, Allergies Code Status Info: DNR Allergies Info: nka           Current Medications (02/22/2018):  This is the current hospital active medication list Current Facility-Administered Medications  Medication Dose Route Frequency Provider Last Rate Last Dose  . 0.9 %  sodium chloride infusion (Manually program via Guardrails IV Fluids)   Intravenous Once Mariel Aloe, MD      . 0.9 %  sodium chloride infusion (Manually program via Guardrails IV Fluids)   Intravenous Once Mariel Aloe, MD      .  acetaminophen (TYLENOL) tablet 650 mg  650 mg Oral Q6H PRN Mariel Aloe, MD       Or  . acetaminophen (TYLENOL) suppository 650 mg  650 mg Rectal Q6H PRN Mariel Aloe, MD      . haloperidol lactate (HALDOL) injection 1 mg  1 mg Intravenous Q6H PRN Mariel Aloe, MD   1 mg at 02/21/18 0139  . ondansetron (ZOFRAN) tablet 4 mg  4 mg Oral Q6H PRN Mariel Aloe, MD       Or  . ondansetron (ZOFRAN) injection 4 mg  4 mg Intravenous Q6H PRN  Mariel Aloe, MD      . polyethylene glycol (MIRALAX / GLYCOLAX) packet 17 g  17 g Oral Daily PRN Mariel Aloe, MD         Discharge Medications: TAKE these medications   Cinnamon 500 MG capsule Take 500 mg by mouth daily.   co-enzyme Q-10 30 MG capsule Take 30 mg by mouth 3 (three) times daily.   gabapentin 100 MG capsule Commonly known as:  NEURONTIN Take 1 capsule (100 mg total) by mouth at bedtime.   meclizine 12.5 MG tablet Commonly known as:  ANTIVERT Take 1 tablet (12.5 mg total) by mouth 3 (three) times daily as needed for dizziness.   multivitamin capsule Take 1 capsule by mouth daily.   tamsulosin 0.4 MG Caps capsule Commonly known as:  FLOMAX TAKE 1 CAPSULE BY MOUTH EVERY DAY      Relevant Imaging Results:  Relevant Lab Results: Recent Results (from the past 240 hour(s))  Urine culture     Status: Abnormal   Collection Time: 02/18/2018  5:55 PM  Result Value Ref Range Status   Specimen Description   Final    URINE, RANDOM Performed at [facility] [address], [place], [place] [address]    Special Requests   Final    NONE Performed at [facility], [place] [address] [place], [place] [address]    Culture MULTIPLE SPECIES PRESENT, SUGGEST RECOLLECTION (A)  Final   Report Status 02/18/2018 FINAL  Final  MRSA PCR Screening     Status: None   Collection Time: 02/03/2018  8:17 PM  Result Value Ref Range Status   MRSA by PCR NEGATIVE NEGATIVE Final    Comment:        The GeneXpert MRSA Assay (FDA approved for NASAL specimens only), is one component of a comprehensive MRSA colonization surveillance program. It is not intended to diagnose MRSA infection nor to guide or monitor treatment for MRSA infections. Performed at [facility], [place] [address] [place], [place] [address]      Additional Information SS# [id number]. Needs palliative care to follow at  facility  [facility], LCSW

## 2018-02-22 NOTE — Discharge Summary (Signed)
Physician Discharge Summary  Jason Lane MHD:[id number] DOB: Nov 18, 1927 DOA: 02/21/2018  PCP: Biagio Borg, MD  Admit date: 02/28/2018 Discharge date: 02/22/2018  Admitted From: home Disposition:  SNF  Recommendations for Outpatient Follow-up:  1. Palliative care to follow-up at facility.  Home Health:No Equipment/Devices:none  Discharge Condition:guarded CODE STATUS:DNR Diet recommendation: Heart Healthy   Brief/Interim Summary: 84 year old with past medical history of neuropathy presents for several right weeks of increasing weakness was found down on the floor, with severe pancytopenia flow cytometry was done that showed 60% blasts hematology was consulted they recommended hospice.  Discharge Diagnoses:  Principal Problem:   Pancytopenia ([place]) Active Problems:   Essential hypertension   Severe anemia   Rhabdomyolysis   Pulmonary edema   Elevated troponin mild   Palliative care by specialist   Goals of care, counseling/discussion   Acute leukemia ([place])  Severe pancytopenia due to acute leukemia: On admission his hemoglobin was 4 he status post 4 units of packed red blood cells flow cytometry was performed that showed 60% blast, hematology was consulted and recommended hospice care as he is not a candidate for chemotherapy. Physical therapy was consulted the recommended skilled nursing facility. Hospice and palliative care was consulted and recommended palliative care to follow-up with rehab.  Pulmonary edema: No evidence of heart failure and echo, likely due to severe pancytopenia.  Rhabdomyolysis: His CK on admission was 2394 he was started on IV fluids is resolved.  Elevated troponins: In the setting of rhabdomyolysis.  Sinus bradycardia: No events on telemetry.  Hypernatremia, likely due to low normal saline after stopping, normal saline his sodium returned to baseline.   Discharge Instructions  Discharge Instructions    Diet - low sodium heart  healthy   Complete by:  As directed    Increase activity slowly   Complete by:  As directed      Allergies as of 02/22/2018   No Known Allergies     Medication List    STOP taking these medications   VITAMIN D-400 400 units Tabs tablet Generic drug:  cholecalciferol     TAKE these medications   Cinnamon 500 MG capsule Take 500 mg by mouth daily.   co-enzyme Q-10 30 MG capsule Take 30 mg by mouth 3 (three) times daily.   gabapentin 100 MG capsule Commonly known as:  NEURONTIN Take 1 capsule (100 mg total) by mouth at bedtime.   meclizine 12.5 MG tablet Commonly known as:  ANTIVERT Take 1 tablet (12.5 mg total) by mouth 3 (three) times daily as needed for dizziness.   multivitamin capsule Take 1 capsule by mouth daily.   tamsulosin 0.4 MG Caps capsule Commonly known as:  FLOMAX TAKE 1 CAPSULE BY MOUTH EVERY DAY       No Known Allergies  Consultations:  None   Procedures/Studies: Dg Chest 2 View  Result Date: 02/22/2018 CLINICAL DATA:  [place] today.  Generalized weakness. EXAM: CHEST - 2 VIEW COMPARISON:  06/23/2012 FINDINGS: Heart size upper limits of normal. Chronic aortic atherosclerosis. Bilateral pleural effusions layering dependently with dependent atelectasis. Mild interstitial edema. No acute bone finding. IMPRESSION: Mild interstitial edema. Small effusions layering dependently with dependent atelectasis. Findings most consistent with congestive heart failure. Could not rule out basilar pneumonia, but the findings may simply be due to effusion associated atelectasis. Electronically Signed   By: Nelson Chimes M.D.   On: 02/15/2018 16:13   Ct Head Wo Contrast  Result Date: 03/01/2018 CLINICAL DATA:  Vertigo.  Weakness. EXAM: CT  HEAD WITHOUT CONTRAST TECHNIQUE: Contiguous axial images were obtained from the base of the skull through the vertex without intravenous contrast. COMPARISON:  None. FINDINGS: Brain: No subdural, epidural, or subarachnoid hemorrhage.  No mass effect or midline shift. Ventricles and sulci are prominent consistent volume loss. Cerebellum, brainstem, and basal cisterns are normal. Scattered white matter changes are noted. No acute cortical ischemia or infarct. Vascular: Calcified atherosclerosis in the intracranial carotids. Skull: Normal. Negative for fracture or focal lesion. Sinuses/Orbits: No acute finding. Other: None. IMPRESSION: No acute intracranial abnormalities. No cause for syncope identified. Electronically Signed   By: Dorise Bullion III M.D   On: 02/02/2018 17:07     Subjective: No complaints.  Discharge Exam: Vitals:   02/21/18 2024 02/22/18 0526  BP: (!) 147/60 (!) 163/74  Pulse: 66 68  Resp:  17  Temp:  (!) 97.5 F (36.4 C)  SpO2:  98%   Vitals:   02/20/18 2152 02/21/18 0357 02/21/18 2024 02/22/18 0526  BP: (!) 158/81 (!) 159/87 (!) 147/60 (!) 163/74  Pulse: 78 65 66 68  Resp: 18 20  17   Temp: 98.1 F (36.7 C) (!) 97.5 F (36.4 C)  (!) 97.5 F (36.4 C)  TempSrc:    Oral  SpO2: 98% 97%  98%  Weight:      Height:        General: Pt is alert, awake, not in acute distress Cardiovascular: RRR, S1/S2 +, no rubs, no gallops Respiratory: CTA bilaterally, no wheezing, no rhonchi Abdominal: Soft, NT, ND, bowel sounds + Extremities: no edema, no cyanosis    The results of significant diagnostics from this hospitalization (including imaging, microbiology, ancillary and laboratory) are listed below for reference.     Microbiology: Recent Results (from the past 240 hour(s))  Urine culture     Status: Abnormal   Collection Time: 02/27/2018  5:55 PM  Result Value Ref Range Status   Specimen Description   Final    URINE, RANDOM Performed at [facility] [address], [place], [place] [address]    Special Requests   Final    NONE Performed at [facility], [place] [address], [place], [place] [address]    Culture MULTIPLE SPECIES PRESENT, SUGGEST RECOLLECTION  (A)  Final   Report Status 02/18/2018 FINAL  Final  MRSA PCR Screening     Status: None   Collection Time: 02/25/2018  8:17 PM  Result Value Ref Range Status   MRSA by PCR NEGATIVE NEGATIVE Final    Comment:        The GeneXpert MRSA Assay (FDA approved for NASAL specimens only), is one component of a comprehensive MRSA colonization surveillance program. It is not intended to diagnose MRSA infection nor to guide or monitor treatment for MRSA infections. Performed at [facility], [place] [address], [place], [place] [address]      Labs: BNP (last 3 results) Recent Labs    02/15/2018 1718  BNP 638.4*   Basic Metabolic Panel: Recent Labs  Lab 02/05/2018 1637 02/17/18 0418 02/18/18 0614 02/20/18 0541 02/22/18 0619  NA 142 147* 139 137 139  K 4.0 3.2* 3.3* 3.4* 3.7  CL 113* 112* 110 107 108  CO2 19* 19* 19* 23 23  GLUCOSE 113* 79 104* 111* 94  BUN 42* 37* 30* 24* 29*  CREATININE 0.99 1.07 0.98 0.86 0.77  CALCIUM 8.2* 8.2* 7.8* 7.8* 8.1*   Liver Function Tests: Recent Labs  Lab 02/01/2018 1637 02/17/18 0418 02/17/18  1331 02/18/18 0614  AST 101* 90*  --  81*  ALT 33 31  --  33  ALKPHOS 75 71  --  72  BILITOT 1.8* 1.6* 1.4* 1.1  PROT 5.7* 5.6*  --  5.5*  ALBUMIN 3.4* 3.2*  --  3.1*   No results for input(s): LIPASE, AMYLASE in the last 168 hours. No results for input(s): AMMONIA in the last 168 hours. CBC: Recent Labs  Lab 02/05/2018 1718 02/17/18 0418 02/17/18 1331 02/18/18 0614 02/19/18 0520 02/20/18 0541 02/22/18 0619  WBC 3.4* 3.5* 4.0 5.5 5.3 6.8 11.2*  NEUTROABS 0.3* THIS TEST WAS ORDERED IN ERROR AND HAS BEEN CREDITED.  --   --   --   --   --   HGB 4.1* 6.2* 9.0* 8.9* 8.6* 9.1* 8.5*  HCT 13.1* 20.0* 27.7* 26.9* 25.9* 27.6* 26.7*  MCV 119.1* 105.3* 100.7* 99.3 98.5 98.6 100.8*  PLT 42* 40* 42* 37* 34* 35* 32*   Cardiac Enzymes: Recent Labs  Lab 02/22/2018 1637 02/17/18 1331 02/17/18 1846 02/17/18 2315 02/20/18 0541  CKTOTAL 2,394*   --   --   --  229  TROPONINI  --  0.03* 0.03* 0.03*  --    BNP: Invalid input(s): POCBNP CBG: No results for input(s): GLUCAP in the last 168 hours. D-Dimer No results for input(s): DDIMER in the last 72 hours. Hgb A1c No results for input(s): HGBA1C in the last 72 hours. Lipid Profile No results for input(s): CHOL, HDL, LDLCALC, TRIG, CHOLHDL, LDLDIRECT in the last 72 hours. Thyroid function studies No results for input(s): TSH, T4TOTAL, T3FREE, THYROIDAB in the last 72 hours.  Invalid input(s): FREET3 Anemia work up No results for input(s): VITAMINB12, FOLATE, FERRITIN, TIBC, IRON, RETICCTPCT in the last 72 hours. Urinalysis    Component Value Date/Time   COLORURINE YELLOW 02/14/2018 1754   APPEARANCEUR CLOUDY (A) 02/05/2018 1754   LABSPEC 1.013 02/13/2018 1754   PHURINE 5.0 02/21/2018 1754   GLUCOSEU NEGATIVE 02/25/2018 1754   GLUCOSEU NEGATIVE 03/31/2017 1000   HGBUR LARGE (A) 03/01/2018 1754   BILIRUBINUR NEGATIVE 02/21/2018 1754   KETONESUR 5 (A) 02/20/2018 1754   PROTEINUR NEGATIVE 02/27/2018 1754   UROBILINOGEN 0.2 03/31/2017 1000   NITRITE NEGATIVE 02/18/2018 1754   LEUKOCYTESUR NEGATIVE 02/08/2018 1754   Sepsis Labs Invalid input(s): PROCALCITONIN,  WBC,  LACTICIDVEN Microbiology Recent Results (from the past 240 hour(s))  Urine culture     Status: Abnormal   Collection Time: 02/08/2018  5:55 PM  Result Value Ref Range Status   Specimen Description   Final    URINE, RANDOM Performed at [facility], [place] [address], [place], [place] [address]    Special Requests   Final    NONE Performed at [facility], [place] [address] [place], [place] [address]    Culture MULTIPLE SPECIES PRESENT, SUGGEST RECOLLECTION (A)  Final   Report Status 02/18/2018 FINAL  Final  MRSA PCR Screening     Status: None   Collection Time: 02/17/2018  8:17 PM  Result Value Ref Range Status   MRSA by PCR NEGATIVE NEGATIVE Final    Comment:         The GeneXpert MRSA Assay (FDA approved for NASAL specimens only), is one component of a comprehensive MRSA colonization surveillance program. It is not intended to diagnose MRSA infection nor to guide or monitor treatment for MRSA infections. Performed at [facility], [place] [address] [place], [place] [address]  Time coordinating discharge: 40 minutes  SIGNED:   Charlynne Cousins, MD  [facility] 02/22/2018, 8:34 AM Pager   If 7PM-7AM, please contact night-coverage [email or web address] Password [email or web address]

## 2018-02-22 NOTE — Progress Notes (Signed)
Daily Progress Note   Patient Name: Jason Lane       Date: 02/22/2018 DOB: 20-Jul-1927  Age: 84 y.o. MRN#: [id number] Attending Physician: No att. providers found Primary Care Physician: Biagio Borg, MD Admit Date: 02/18/2018  Reason for Consultation/Follow-up: Establishing goals of care  Subjective: Sitting in bedside chair.  Ate all of breakfast this AM.   Discussed again his understanding that  he is not going to recover from his illness overall, but he wants to work to rehab to see if he can get strong enough to return to his home.  His only concern today is making call to let his neighbor know where he is going for rehab.  See below   Length of Stay: 6  Current Medications: Scheduled Meds:  . sodium chloride   Intravenous Once  . sodium chloride   Intravenous Once    Continuous Infusions:   PRN Meds: acetaminophen **OR** acetaminophen, haloperidol lactate, ondansetron **OR** ondansetron (ZOFRAN) IV, polyethylene glycol  Physical Exam         General exam: Appears calm and comfortable Respiratory system: Clear to auscultation. Respiratory effort normal. Cardiovascular system: S1 & S2 heard, RRR. No murmurs, rubs, gallops or clicks. Gastrointestinal system: Abdomen is nondistended, soft and nontender. Normal bowel sounds heard. Central nervous system: Alert and oriented to person and place. Tremor. Extremities: No edema. No calf tenderness Skin: No cyanosis. No rashes   Vital Signs: BP 126/68   Pulse (!) 42   Temp 98.3 F (36.8 C)   Resp 17   Ht 5\' 8"  (1.727 m)   Wt 57 kg   SpO2 99%   BMI 19.11 kg/m  SpO2: SpO2: 99 % O2 Device: O2 Device: Room Air O2 Flow Rate:    Intake/output summary:   Intake/Output Summary (Last 24 hours) at 02/22/2018 1717 Last  data filed at 02/22/2018 1300 Gross per 24 hour  Intake 840 ml  Output -  Net 840 ml   LBM: Last BM Date: 02/20/18 Baseline Weight: Weight: 57 kg Most recent weight: Weight: 57 kg       Palliative Assessment/Data:    Flowsheet Rows     Most Recent Value  Intake Tab  Referral Department  Hospitalist  Unit at Time of Referral  ICU  Palliative Care Primary Diagnosis  Cancer  Date  Notified  02/18/18  Palliative Care Type  New Palliative care  Reason for referral  Clarify Goals of Care  Date of Admission  02/18/2018  Date first seen by Palliative Care  02/19/18  # of days Palliative referral response time  1 Day(s)  # of days IP prior to Palliative referral  2  Clinical Assessment  Psychosocial & Spiritual Assessment  Palliative Care Outcomes      Patient Active Problem List   Diagnosis Date Noted  . Acute leukemia ([place]) 02/22/2018  . Palliative care by specialist   . Goals of care, counseling/discussion   . Pancytopenia ([place]) 02/27/2018  . Severe anemia 02/09/2018  . Rhabdomyolysis 02/05/2018  . Pulmonary edema 02/17/2018  . Elevated troponin mild 02/19/2018  . Gait disorder 08/01/2017  . BPPV (benign paroxysmal positional vertigo) 08/01/2017  . Gross hematuria 03/30/2017  . BRBPR (bright red blood per rectum) 03/30/2017  . Rotator cuff tear arthropathy of both shoulders 11/30/2016  . Hyperglycemia 06/29/2016  . S/P laparoscopic hernia repair 06/24/2015  . Inguinal hernia, left 05/27/2015  . Skin lesion 05/27/2015  . Bradycardia 12/24/2014  . Chest pain 06/23/2012  . Peripheral neuropathy 12/19/2011  . Hematochezia 12/15/2011  . Leg cramps 12/15/2011  . Bladder neck obstruction 12/15/2011  . Vertigo 09/11/2011  . Preventative health care 09/09/2010  . ANEMIA-IRON DEFICIENCY 07/08/2010  . Essential hypertension 02/01/2010  . Hyperlipidemia 12/03/2009  . Asthma 12/03/2009  . GERD 12/03/2009  . DIVERTICULOSIS, COLON 12/03/2009  . BPH associated with nocturia  12/03/2009  . FATIGUE 12/03/2009  . TREMOR 12/03/2009  . MALIGNANT MELANOMA, HX OF 12/03/2009  . TOBACCO USE, QUIT 12/03/2009    Palliative Care Assessment & Plan   Patient Profile:    Assessment:  84 year old gentleman with severe fatigue weakness and falls and neuropathy. Patient was admitted to the hospital with feeling low hemoglobin of 4 g/dL. In addition he was found to have neutropenia, pancytopenia. He was seen and evaluated by hematology oncology and is undergoing evaluation. There remains a high suspicion for possibility of acute leukemia.  Palliative consultation for goals of care discussions.   Recommendations/Plan:  Discussed with patient about SNF rehab with palliative, and eventually, him needing hospice services.  He would like to pursue SNF at this time with hope that he may be able to regain enough functional status to return home (at least for a period of time).   Durable DNR completed and placed on chart.      Code Status:    Code Status Orders  (From admission, onward)         Start     Ordered   02/20/18 1114  Do not attempt resuscitation (DNR)  Continuous    Question Answer Comment  In the event of cardiac or respiratory ARREST Do not call a "code blue"   In the event of cardiac or respiratory ARREST Do not perform Intubation, CPR, defibrillation or ACLS   In the event of cardiac or respiratory ARREST Use medication by any route, position, wound care, and other measures to relive pain and suffering. May use oxygen, suction and manual treatment of airway obstruction as needed for comfort.      02/20/18 1113        Code Status History    Date Active Date Inactive Code Status Order ID Comments User Context   02/25/2018 2049 02/20/2018 1113 Full Code [id number]  Johnson-Pitts, Endia, MD Inpatient       Prognosis:  < 3 months  Discharge Planning:  [facility] for rehab with Palliative care service follow-up  Care plan was  discussed with patient.   Thank you for allowing the Palliative Medicine Team to assist in the care of this patient.   Total Time 20 Prolonged Time Billed  no       Greater than 50%  of this time was spent counseling and coordinating care related to the above assessment and plan.  Micheline Rough, MD  Please contact Palliative Medicine Team phone at [phone] for questions and concerns.

## 2018-02-22 NOTE — Progress Notes (Signed)
Attempted to call report. Was on hold for 5 minutes with no answer. Will be available if they need to call with any questions or concerns.

## 2018-02-23 ENCOUNTER — Telehealth: Payer: Self-pay | Admitting: *Deleted

## 2018-02-23 DIAGNOSIS — G629 Polyneuropathy, unspecified: Secondary | ICD-10-CM | POA: Diagnosis not present

## 2018-02-23 DIAGNOSIS — C9100 Acute lymphoblastic leukemia not having achieved remission: Secondary | ICD-10-CM | POA: Diagnosis not present

## 2018-02-23 DIAGNOSIS — D649 Anemia, unspecified: Secondary | ICD-10-CM | POA: Diagnosis not present

## 2018-02-23 DIAGNOSIS — R5381 Other malaise: Secondary | ICD-10-CM | POA: Diagnosis not present

## 2018-02-23 NOTE — Telephone Encounter (Signed)
Pt was on TCM report admitted 02/02/2018 for Pancytopenia. Pt hemoglobin was 4 he status post 4 units of packed red blood cells flow cytometry was performed that showed 60% blast, hematology was consulted and recommended hospice care as he is not a candidate for chemotherapy. Pt D/C to SNF which will follow-up up with palliative care.Marland KitchenJohny Chess

## 2018-02-27 DIAGNOSIS — R5381 Other malaise: Secondary | ICD-10-CM | POA: Diagnosis not present

## 2018-02-27 DIAGNOSIS — C9100 Acute lymphoblastic leukemia not having achieved remission: Secondary | ICD-10-CM | POA: Diagnosis not present

## 2018-02-27 DIAGNOSIS — D649 Anemia, unspecified: Secondary | ICD-10-CM | POA: Diagnosis not present

## 2018-02-27 DIAGNOSIS — G629 Polyneuropathy, unspecified: Secondary | ICD-10-CM | POA: Diagnosis not present

## 2018-03-03 ENCOUNTER — Non-Acute Institutional Stay: Payer: Medicare Other | Admitting: Licensed Clinical Social Worker

## 2018-03-03 ENCOUNTER — Emergency Department (HOSPITAL_COMMUNITY): Payer: Medicare Other

## 2018-03-03 ENCOUNTER — Other Ambulatory Visit: Payer: Self-pay

## 2018-03-03 ENCOUNTER — Encounter (HOSPITAL_COMMUNITY): Payer: Self-pay

## 2018-03-03 ENCOUNTER — Observation Stay (HOSPITAL_COMMUNITY)
Admission: EM | Admit: 2018-03-03 | Discharge: 2018-03-04 | Disposition: A | Discharge status: Skilled Nursing Facility | Payer: Medicare Other | Attending: Internal Medicine | Admitting: Internal Medicine

## 2018-03-03 DIAGNOSIS — D630 Anemia in neoplastic disease: Secondary | ICD-10-CM | POA: Insufficient documentation

## 2018-03-03 DIAGNOSIS — J810 Acute pulmonary edema: Secondary | ICD-10-CM | POA: Diagnosis not present

## 2018-03-03 DIAGNOSIS — Z515 Encounter for palliative care: Secondary | ICD-10-CM

## 2018-03-03 DIAGNOSIS — I5083 High output heart failure: Secondary | ICD-10-CM | POA: Diagnosis not present

## 2018-03-03 DIAGNOSIS — I5033 Acute on chronic diastolic (congestive) heart failure: Secondary | ICD-10-CM | POA: Diagnosis not present

## 2018-03-03 DIAGNOSIS — J9601 Acute respiratory failure with hypoxia: Secondary | ICD-10-CM | POA: Diagnosis not present

## 2018-03-03 DIAGNOSIS — Z87891 Personal history of nicotine dependence: Secondary | ICD-10-CM | POA: Insufficient documentation

## 2018-03-03 DIAGNOSIS — I509 Heart failure, unspecified: Secondary | ICD-10-CM | POA: Diagnosis not present

## 2018-03-03 DIAGNOSIS — 419620001 Death: Secondary | SNOMED CT | POA: Diagnosis not present

## 2018-03-03 DIAGNOSIS — Z85820 Personal history of malignant melanoma of skin: Secondary | ICD-10-CM | POA: Insufficient documentation

## 2018-03-03 DIAGNOSIS — D61818 Other pancytopenia: Secondary | ICD-10-CM | POA: Diagnosis present

## 2018-03-03 DIAGNOSIS — N401 Enlarged prostate with lower urinary tract symptoms: Secondary | ICD-10-CM | POA: Diagnosis present

## 2018-03-03 DIAGNOSIS — I110 Hypertensive heart disease with heart failure: Secondary | ICD-10-CM | POA: Insufficient documentation

## 2018-03-03 DIAGNOSIS — G629 Polyneuropathy, unspecified: Secondary | ICD-10-CM

## 2018-03-03 DIAGNOSIS — J45909 Unspecified asthma, uncomplicated: Secondary | ICD-10-CM | POA: Diagnosis present

## 2018-03-03 DIAGNOSIS — E785 Hyperlipidemia, unspecified: Secondary | ICD-10-CM | POA: Diagnosis present

## 2018-03-03 DIAGNOSIS — D649 Anemia, unspecified: Secondary | ICD-10-CM | POA: Diagnosis present

## 2018-03-03 DIAGNOSIS — C9500 Acute leukemia of unspecified cell type not having achieved remission: Principal | ICD-10-CM | POA: Diagnosis present

## 2018-03-03 DIAGNOSIS — Z79899 Other long term (current) drug therapy: Secondary | ICD-10-CM | POA: Insufficient documentation

## 2018-03-03 DIAGNOSIS — R0602 Shortness of breath: Secondary | ICD-10-CM | POA: Diagnosis not present

## 2018-03-03 DIAGNOSIS — I10 Essential (primary) hypertension: Secondary | ICD-10-CM | POA: Diagnosis present

## 2018-03-03 DIAGNOSIS — Z66 Do not resuscitate: Secondary | ICD-10-CM | POA: Insufficient documentation

## 2018-03-03 DIAGNOSIS — J811 Chronic pulmonary edema: Secondary | ICD-10-CM | POA: Diagnosis present

## 2018-03-03 DIAGNOSIS — R351 Nocturia: Secondary | ICD-10-CM | POA: Insufficient documentation

## 2018-03-03 DIAGNOSIS — J9811 Atelectasis: Secondary | ICD-10-CM | POA: Diagnosis not present

## 2018-03-03 LAB — CBC WITH DIFFERENTIAL/PLATELET
BASOS PCT: 0 %
Basophils Absolute: 0 10*3/uL (ref 0.0–0.1)
Eosinophils Absolute: 0 10*3/uL (ref 0.0–0.5)
Eosinophils Relative: 0 %
HCT: 20.3 % — ABNORMAL LOW (ref 39.0–52.0)
Hemoglobin: 6.5 g/dL — CL (ref 13.0–17.0)
Lymphocytes Relative: 90 %
Lymphs Abs: 4.8 10*3/uL — ABNORMAL HIGH (ref 0.7–4.0)
MCH: 33.3 pg (ref 26.0–34.0)
MCHC: 32 g/dL (ref 30.0–36.0)
MCV: 104.1 fL — ABNORMAL HIGH (ref 80.0–100.0)
Monocytes Absolute: 0.4 10*3/uL (ref 0.1–1.0)
Monocytes Relative: 8 %
NEUTROS ABS: 0.1 10*3/uL — AB (ref 1.7–7.7)
NEUTROS PCT: 2 %
PLATELETS: 49 10*3/uL — AB (ref 150–400)
RBC: 1.95 MIL/uL — AB (ref 4.22–5.81)
RDW: 21.4 % — ABNORMAL HIGH (ref 11.5–15.5)
WBC MORPHOLOGY: ABNORMAL
WBC: 5.3 10*3/uL (ref 4.0–10.5)

## 2018-03-03 LAB — PREPARE RBC (CROSSMATCH)

## 2018-03-03 LAB — RETICULOCYTES
Immature Retic Fract: 18.5 % — ABNORMAL HIGH (ref 2.3–15.9)
RBC.: 2.04 MIL/uL — ABNORMAL LOW (ref 4.22–5.81)
RETIC CT PCT: 1.5 % (ref 0.4–3.1)
Retic Count, Absolute: 31.2 10*3/uL (ref 19.0–186.0)

## 2018-03-03 LAB — BASIC METABOLIC PANEL
ANION GAP: 5 (ref 5–15)
BUN: 21 mg/dL (ref 8–23)
CO2: 24 mmol/L (ref 22–32)
Calcium: 8 mg/dL — ABNORMAL LOW (ref 8.9–10.3)
Chloride: 109 mmol/L (ref 98–111)
Creatinine, Ser: 0.84 mg/dL (ref 0.61–1.24)
GFR calc Af Amer: >60 mL/min (ref >60)
Glucose, Bld: 156 mg/dL — ABNORMAL HIGH (ref 70–99)
POTASSIUM: 3.6 mmol/L (ref 3.5–5.1)
SODIUM: 138 mmol/L (ref 135–145)

## 2018-03-03 LAB — TROPONIN I: Troponin I: 0.03 ng/mL (ref <0.03)

## 2018-03-03 LAB — BRAIN NATRIURETIC PEPTIDE: B NATRIURETIC PEPTIDE 5: 155.4 pg/mL — AB (ref 0.0–100.0)

## 2018-03-03 MED ORDER — TAMSULOSIN HCL 0.4 MG PO CAPS
0.4000 mg | ORAL_CAPSULE | Freq: Every evening | ORAL | Status: DC
  Administered 2018-03-03: 0.4 mg via ORAL
  Filled 2018-03-03: qty 1

## 2018-03-03 MED ORDER — SODIUM CHLORIDE 0.9 % IV SOLN: 10.0000 mL/h | Freq: Once | INTRAVENOUS | Status: DC

## 2018-03-03 MED ORDER — FUROSEMIDE 10 MG/ML IJ SOLN
40.0000 mg | Freq: Two times a day (BID) | INTRAMUSCULAR | Status: DC
  Administered 2018-03-04: 40 mg via INTRAVENOUS
  Filled 2018-03-03: qty 4

## 2018-03-03 MED ORDER — ADULT MULTIVITAMIN W/MINERALS CH
1.0000 | ORAL_TABLET | Freq: Every day | ORAL | Status: DC
  Administered 2018-03-04: 1 via ORAL
  Filled 2018-03-03: qty 1

## 2018-03-03 MED ORDER — ACETAMINOPHEN 325 MG PO TABS: 650.0000 mg | ORAL_TABLET | ORAL | Status: DC | PRN

## 2018-03-03 MED ORDER — MULTIVITAMINS PO CAPS: 1.0000 | ORAL_CAPSULE | Freq: Every day | ORAL | Status: DC

## 2018-03-03 MED ORDER — FUROSEMIDE 10 MG/ML IJ SOLN
40.0000 mg | Freq: Once | INTRAMUSCULAR | Status: AC
  Administered 2018-03-03: 40 mg via INTRAVENOUS
  Filled 2018-03-03: qty 4

## 2018-03-03 MED ORDER — MECLIZINE HCL 25 MG PO TABS: 12.5000 mg | ORAL_TABLET | Freq: Three times a day (TID) | ORAL | Status: DC | PRN

## 2018-03-03 MED ORDER — GABAPENTIN 100 MG PO CAPS
100.0000 mg | ORAL_CAPSULE | Freq: Every day | ORAL | Status: DC
  Administered 2018-03-03: 100 mg via ORAL
  Filled 2018-03-03: qty 1

## 2018-03-03 MED ORDER — SODIUM CHLORIDE 0.9% FLUSH: 3.0000 mL | INTRAVENOUS | Status: DC | PRN

## 2018-03-03 MED ORDER — SODIUM CHLORIDE 0.9 % IV SOLN: 250.0000 mL | INTRAVENOUS | Status: DC | PRN

## 2018-03-03 MED ORDER — COENZYME Q10 30 MG PO CAPS: 30.0000 mg | ORAL_CAPSULE | Freq: Three times a day (TID) | ORAL | Status: DC

## 2018-03-03 MED ORDER — ONDANSETRON HCL 4 MG/2ML IJ SOLN: 4.0000 mg | Freq: Four times a day (QID) | INTRAMUSCULAR | Status: DC | PRN

## 2018-03-03 MED ORDER — ALPRAZOLAM 0.25 MG PO TABS
0.2500 mg | ORAL_TABLET | Freq: Two times a day (BID) | ORAL | Status: DC | PRN
  Administered 2018-03-03: 0.25 mg via ORAL
  Filled 2018-03-03: qty 1

## 2018-03-03 MED ORDER — SODIUM CHLORIDE 0.9% FLUSH
3.0000 mL | Freq: Two times a day (BID) | INTRAVENOUS | Status: DC
  Administered 2018-03-04: 3 mL via INTRAVENOUS

## 2018-03-03 NOTE — ED Provider Notes (Signed)
Patient signed out to me from Dr. Kathrynn Humble.  He 84 year old male here with increased shortness of breath and weakness and found to be anemic again.  It sounds like his ongoing work-up with hematology for possible leukemia.  He was admitted a few weeks ago for similar some duration.  Plan is for me to discuss the admission with the hospitalist when they call back and also to inform them regarding that he may need some diuresis in between transfusions as he does have some signs of fluid overload on chest x-ray.  Clinical Course as of Mar 03 2326  Fri Mar 03, 2018  1705 Discussed with hospitalist regarding admission.  They asked if I would order him 40 of Lasix now prior to transfusion.   [MB]    Clinical Course User Index [MB] Hayden Rasmussen, MD      Hayden Rasmussen, MD 03/03/18 [phone]

## 2018-03-03 NOTE — H&P (Signed)
History and Physical    Agam Davenport EBR:[id number] DOB: 03-14-1928 DOA: 03/03/2018  PCP: Biagio Borg, MD   Patient coming from: Skilled nursing facility  Chief Complaint: Shortness of breath, symptom medic anemia, pulmonary edema  HPI: Jason Lane is a 84 y.o. male with medical history significant of hypertension, hyperlipidemia, BPH, peripheral neuropathy, recent diagnosis of acute leukemia due to presenting with pancytopenia discharged on 02/22/2018 after being determined not a candidate for any treatment and recommended for hospice who presents with shortness of breath and found to be hypoxic and anemic.  Patient reports that he was doing fairly well until yesterday when he began to notice more shortness of breath.  He is also had progressive lower extremity edema and some orthopnea.  He denies any hematochezia, hematemesis, melena, coffee-ground emesis.  He has been eating not well.  He has not had a cough, abdominal pain, chest pain, syncope/presyncope.  He has not had any exertional chest pain.  He does have exertional shortness of breath.  Not had any fevers. On extensive discussion with the patient he reports that he is ready to die and is unclear why this process is taking so long.  When informed that he might be a reasonable candidate for inpatient hospice particularly if it was decided that he no longer should receive transfusions he was willing to explore this opportunity.  ED Course: In the ER patient's vitals were notable for tachypnea.  Patient required 2 L nasal cannula.  Labs are notable for hemoglobin of 6.5 with discharge hemoglobin of 8.5 on 02/22/2018.  Platelets were 49.  Troponin was 0.03.  Patient was given 2 units packed red blood cells and IV furosemide 40 mg.  Review of Systems: As per HPI otherwise 10 point review of systems negative.    Past Medical History:  Diagnosis Date  . ANEMIA-IRON DEFICIENCY 07/08/2010  . ASTHMA 12/03/2009   States no history to asthma  .  BENIGN PROSTATIC HYPERTROPHY 12/03/2009  . Cancer ([place])    skin cancer  . DIVERTICULOSIS, COLON 12/03/2009  . FATIGUE 12/03/2009  . GERD 12/03/2009  . HYPERLIPIDEMIA 12/03/2009  . HYPERTENSION 02/01/2010  . HYPERTENSION NEC 12/03/2009  . MALIGNANT MELANOMA, HX OF 12/03/2009  . SKIN LESION 12/03/2009  . TOBACCO USE, QUIT 12/03/2009  . TREMOR 12/03/2009    Past Surgical History:  Procedure Laterality Date  . COLONOSCOPY    . HERNIA REPAIR    . INGUINAL HERNIA REPAIR Left 06/24/2015   Procedure: LAPAROSCOPIC REPAIR RECURRENT LEFT INGUINAL HERNIA;  Surgeon: Greer Pickerel, MD;  Location: [facility];  Service: General;  Laterality: Left;  . inguinal herniorrhapy  1970   right  . INSERTION OF MESH Left 06/24/2015   Procedure: INSERTION OF MESH;  Surgeon: Greer Pickerel, MD;  Location: [facility];  Service: General;  Laterality: Left;  . s/p left leg melanoma       reports that he has quit smoking. He has never used smokeless tobacco. He reports that he drinks alcohol. He reports that he does not use drugs.  No Known Allergies  Family History  Problem Relation Age of Onset  . Colon cancer Father      Prior to Admission medications   Medication Sig Start Date End Date Taking? Authorizing Provider  Cinnamon 500 MG capsule Take 500 mg by mouth daily.      [provider]  co-enzyme Q-10 30 MG capsule Take 30 mg by mouth 3 (three) times daily.    [provider]  gabapentin (NEURONTIN) 100 MG capsule Take 1 capsule (100 mg total) by mouth at bedtime. 02/22/18   Charlynne Cousins, MD  meclizine (ANTIVERT) 12.5 MG tablet Take 1 tablet (12.5 mg total) by mouth 3 (three) times daily as needed for dizziness. 08/15/17 08/15/18  Biagio Borg, MD  Multiple Vitamin (MULTIVITAMIN) capsule Take 1 capsule by mouth daily.      [provider]  tamsulosin (FLOMAX) 0.4 MG CAPS capsule TAKE 1 CAPSULE BY MOUTH EVERY DAY Patient not taking: Reported on 02/20/2018 06/06/17   Biagio Borg, MD    Physical  Exam: Vitals:   03/03/18 1600  BP: (!) 150/69  Pulse: 72  Resp: (!) 22  SpO2: 97%    Constitutional: no distress, cachectic Vitals:   03/03/18 1600  BP: (!) 150/69  Pulse: 72  Resp: (!) 22  SpO2: 97%   Eyes: anicteric sclera ENMT: dry mucus membranes, dry mouth Neck: normal, supple Respiratory: moderately increased work of breathing, crackles in bilateral lung fields, no wheezes or rhonchi Cardiovascular: Regular rate and rhythm, 2 out of 6 systolic murmur heard across precordium Abdomen: Soft, nontender, no rebound or guarding Musculoskeletal: 3+ lower extremity edema Skin: no rashes over visible skin Neurologic: Grossly intact, mildly misaligned eyes Psychiatric: Normal judgment and insight. Alert and oriented x 3. Normal mood.     Labs on Admission: I have personally reviewed following labs and imaging studies  CBC: Recent Labs  Lab 03/03/18 1428  WBC 5.3  NEUTROABS 0.1*  HGB 6.5*  HCT 20.3*  MCV 104.1*  PLT 49*   Basic Metabolic Panel: Recent Labs  Lab 03/03/18 1428  NA 138  K 3.6  CL 109  CO2 24  GLUCOSE 156*  BUN 21  CREATININE 0.84  CALCIUM 8.0*   GFR: CrCl cannot be calculated (Unknown ideal weight.). Liver Function Tests: No results for input(s): AST, ALT, ALKPHOS, BILITOT, PROT, ALBUMIN in the last 168 hours. No results for input(s): LIPASE, AMYLASE in the last 168 hours. No results for input(s): AMMONIA in the last 168 hours. Coagulation Profile: No results for input(s): INR, PROTIME in the last 168 hours. Cardiac Enzymes: Recent Labs  Lab 03/03/18 1428  TROPONINI 0.03*   BNP (last 3 results) No results for input(s): PROBNP in the last 8760 hours. HbA1C: No results for input(s): HGBA1C in the last 72 hours. CBG: No results for input(s): GLUCAP in the last 168 hours. Lipid Profile: No results for input(s): CHOL, HDL, LDLCALC, TRIG, CHOLHDL, LDLDIRECT in the last 72 hours. Thyroid Function Tests: No results for input(s): TSH,  T4TOTAL, FREET4, T3FREE, THYROIDAB in the last 72 hours. Anemia Panel: No results for input(s): VITAMINB12, FOLATE, FERRITIN, TIBC, IRON, RETICCTPCT in the last 72 hours. Urine analysis:    Component Value Date/Time   COLORURINE YELLOW 02/04/2018 1754   APPEARANCEUR CLOUDY (A) 02/21/2018 1754   LABSPEC 1.013 02/18/2018 1754   PHURINE 5.0 02/07/2018 1754   GLUCOSEU NEGATIVE 02/28/2018 1754   GLUCOSEU NEGATIVE 03/31/2017 1000   HGBUR LARGE (A) 03/02/2018 1754   BILIRUBINUR NEGATIVE 02/03/2018 1754   KETONESUR 5 (A) 03/02/2018 1754   PROTEINUR NEGATIVE 02/20/2018 1754   UROBILINOGEN 0.2 03/31/2017 1000   NITRITE NEGATIVE 02/24/2018 1754   LEUKOCYTESUR NEGATIVE 02/03/2018 1754    Radiological Exams on Admission: Dg Chest 2 View  Result Date: 03/03/2018 CLINICAL DATA:  Hypoxia EXAM: CHEST - 2 VIEW COMPARISON:  02/12/2018 FINDINGS: Cardiac shadow is within normal limits. The lungs are well aerated bilaterally. Mild increased vascular congestion  is noted with early interstitial edema. Bibasilar atelectatic changes are noted. Small posterior effusions are seen. Degenerative changes of the thoracic spine are noted. IMPRESSION: Changes of mild CHF with bibasilar atelectasis. Electronically Signed   By: Inez Catalina M.D.   On: 03/03/2018 15:47    EKG: Independently reviewed. Sinus rhythm, no acute ST segment changes  Assessment/Plan Active Problems:   Hyperlipidemia   Essential hypertension   Asthma   BPH associated with nocturia   Peripheral neuropathy   Pancytopenia ([facility])   Severe anemia   Pulmonary edema   Acute leukemia ([place])   #) Symptomatic anemia: Suspect patient has shortness of breath that is multifactorial but mostly related to high output heart failure due to anemia.  He is fairly impressive drop of 2 g over just 7 days suggest that he has fairly profound bone marrow failure.  Aggressive blood transfusion is limited by the fact that he has interstitial edema and shortness of  breath.  Of note he does not have an elevated bilirubin.  Have low suspicion for hemolytic anemia at this time. -2 units packed red blood cells -Diuresis per below  #) Acute hypoxic respiratory failure due to acute on chronic diastolic heart failure: Echo done during prior admission on 02/17/2018 showed normal ejection fraction but could not comment on LV diastolic dysfunction.  Suspect patient has at least some degree of LV diastolic dysfunction as he carries a diagnosis of hypertension that is untreated now.  Unfortunately he requires blood transfusions.  Suspect also that he might have some degree of high output heart failure due to anemia causing his pulmonary edema.  Additionally he is now quite hypertensive in the emergency department. -IV furosemide 40 mg twice daily -Strict ins and outs, weigh daily -Fluid restrict -Telemetry  #) Acute leukemia: Apparently not a candidate for any chemotherapy due to his advanced age and poor functional status.  Patient is interested in inpatient hospice. - Palliative care consult in the morning for discussion of inpatient hospice.  #) Pretension/hyperlipidemia: These are currently not being treated.  #) Peripheral neuropathy: - Continue gabapentin 100 mg nightly  #) BPH: -Continue tamsulosin 0.4 mg nightly  Fluids: Restrict Elect lites: Monitor and supplement Nutrition: Regular diet   prophylaxis: Thrombus cytopenic  Disposition: Pending resolution of hypoxia and transfusion as well as discussion with palliative care about possible discharge to inpatient hospice versus back to skilled nursing facility with outpatient palliative care  DO NOT RESUSCITATE   Cristy Folks MD [facility]  If 7PM-7AM, please contact night-coverage [email or web address] Password [email or web address]  03/03/2018, 5:08 PM

## 2018-03-03 NOTE — Progress Notes (Signed)
PHARMACIST - PHYSICIAN ORDER COMMUNICATION  CONCERNING: P&T Medication Policy on Herbal Medications  DESCRIPTION:  This patient's order for:  Co Enzyme Q  has been noted.  This product(s) is classified as an "herbal" or natural product. Due to a lack of definitive safety studies or FDA approval, nonstandard manufacturing practices, plus the potential risk of unknown drug-drug interactions while on inpatient medications, the Pharmacy and Therapeutics Committee does not permit the use of "herbal" or natural products of this type within [facility].   ACTION TAKEN: The pharmacy department is unable to verify this order at this time and your patient has been informed of this safety policy. Please reevaluate patient's clinical condition at discharge and address if the herbal or natural product(s) should be resumed at that time.  Peggyann Juba, PharmD, BCPS Pharmacy: [phone] 03/03/2018 8:16 PM

## 2018-03-03 NOTE — ED Triage Notes (Signed)
Patient BIB EMS from [facility]) nursing home with complaints. Staff noted the patient's oxygen saturations were in the 80th percentiles. Per EMS RA was 90%, with [facility] sats were 95%. Non productive cough and audible wheezing for EMS. History of asthma. Patient denies chest pain. Patient is DNR - [facility] is at patient bedside.

## 2018-03-03 NOTE — ED Notes (Signed)
Date and time results received: 03/03/18 3:53 PM  (use smartphrase ".now" to insert current time)  Test: Troponin Critical Value: 0.03  Name of Provider Notified: Nanavati  Orders Received? Or Actions Taken?: Awaiting further orders

## 2018-03-03 NOTE — ED Provider Notes (Addendum)
[facility] DEPT Provider Note   CSN: [id number] Arrival date & time: 03/03/18  1415     History   Chief Complaint Chief Complaint  Patient presents with  . Shortness of Breath    HPI Jason Lane is a 84 y.o. male.  HPI  84 year old male with history of leukemia and a recent admission for pancytopenia comes in with chief complaint of shortness of breath.  Patient also has history of hypertension and hyperlipidemia along with asthma.  According to the patient he started having shortness of breath while he was resting.  EMS states that the nursing home noted patient to be hypoxic during their evaluation.  Patient's O2 sats were in the 80s and with 2 L of O2, his O2 sats responded.  Patient admits to having some cough that has been dry.  He denies any chest pain.  Patient did not have any wheezing according to EMS and he did not receive any medications in route.  Past Medical History:  Diagnosis Date  . ANEMIA-IRON DEFICIENCY 07/08/2010  . ASTHMA 12/03/2009   States no history to asthma  . BENIGN PROSTATIC HYPERTROPHY 12/03/2009  . Cancer ([place])    skin cancer  . DIVERTICULOSIS, COLON 12/03/2009  . FATIGUE 12/03/2009  . GERD 12/03/2009  . HYPERLIPIDEMIA 12/03/2009  . HYPERTENSION 02/01/2010  . HYPERTENSION NEC 12/03/2009  . MALIGNANT MELANOMA, HX OF 12/03/2009  . SKIN LESION 12/03/2009  . TOBACCO USE, QUIT 12/03/2009  . TREMOR 12/03/2009    Patient Active Problem List   Diagnosis Date Noted  . Acute leukemia ([place]) 02/22/2018  . Palliative care by specialist   . Goals of care, counseling/discussion   . Pancytopenia ([place]) 02/04/2018  . Severe anemia 02/10/2018  . Rhabdomyolysis 02/03/2018  . Pulmonary edema 02/08/2018  . Elevated troponin mild 02/15/2018  . Gait disorder 08/01/2017  . BPPV (benign paroxysmal positional vertigo) 08/01/2017  . Gross hematuria 03/30/2017  . BRBPR (bright red blood per rectum) 03/30/2017  . Rotator cuff tear arthropathy of  both shoulders 11/30/2016  . Hyperglycemia 06/29/2016  . S/P laparoscopic hernia repair 06/24/2015  . Inguinal hernia, left 05/27/2015  . Skin lesion 05/27/2015  . Bradycardia 12/24/2014  . Chest pain 06/23/2012  . Peripheral neuropathy 12/19/2011  . Hematochezia 12/15/2011  . Leg cramps 12/15/2011  . Bladder neck obstruction 12/15/2011  . Vertigo 09/11/2011  . Preventative health care 09/09/2010  . ANEMIA-IRON DEFICIENCY 07/08/2010  . Essential hypertension 02/01/2010  . Hyperlipidemia 12/03/2009  . Asthma 12/03/2009  . GERD 12/03/2009  . DIVERTICULOSIS, COLON 12/03/2009  . BPH associated with nocturia 12/03/2009  . FATIGUE 12/03/2009  . TREMOR 12/03/2009  . MALIGNANT MELANOMA, HX OF 12/03/2009  . TOBACCO USE, QUIT 12/03/2009    Past Surgical History:  Procedure Laterality Date  . COLONOSCOPY    . HERNIA REPAIR    . INGUINAL HERNIA REPAIR Left 06/24/2015   Procedure: LAPAROSCOPIC REPAIR RECURRENT LEFT INGUINAL HERNIA;  Surgeon: Greer Pickerel, MD;  Location: [facility];  Service: General;  Laterality: Left;  . inguinal herniorrhapy  1970   right  . INSERTION OF MESH Left 06/24/2015   Procedure: INSERTION OF MESH;  Surgeon: Greer Pickerel, MD;  Location: [facility];  Service: General;  Laterality: Left;  . s/p left leg melanoma          Home Medications    Prior to Admission medications   Medication Sig Start Date End Date Taking? Authorizing Provider  Cinnamon 500 MG capsule Take 500 mg by mouth  daily.      [provider]  co-enzyme Q-10 30 MG capsule Take 30 mg by mouth 3 (three) times daily.    [provider]  gabapentin (NEURONTIN) 100 MG capsule Take 1 capsule (100 mg total) by mouth at bedtime. 02/22/18   Charlynne Cousins, MD  meclizine (ANTIVERT) 12.5 MG tablet Take 1 tablet (12.5 mg total) by mouth 3 (three) times daily as needed for dizziness. 08/15/17 08/15/18  Biagio Borg, MD  Multiple Vitamin (MULTIVITAMIN) capsule Take 1 capsule by mouth daily.       [provider]  tamsulosin (FLOMAX) 0.4 MG CAPS capsule TAKE 1 CAPSULE BY MOUTH EVERY DAY Patient not taking: Reported on 02/11/2018 06/06/17   Biagio Borg, MD    Family History Family History  Problem Relation Age of Onset  . Colon cancer Father     Social History Social History   Tobacco Use  . Smoking status: Former Research scientist (life sciences)  . Smokeless tobacco: Never Used  . Tobacco comment: none since approx 1985  Substance Use Topics  . Alcohol use: Yes    Comment: occ beer   . Drug use: No     Allergies   Patient has no known allergies.   Review of Systems Review of Systems  Constitutional: Positive for activity change.  Respiratory: Positive for cough, shortness of breath and wheezing.   Cardiovascular: Negative for chest pain.  Gastrointestinal: Negative for nausea and vomiting.  Skin: Negative for rash.  All other systems reviewed and are negative.   Physical Exam Updated Vital Signs BP (!) 150/69   Pulse 72   Resp (!) 22   SpO2 97%   Physical Exam  Constitutional: He is oriented to person, place, and time. He appears well-developed.  HENT:  Head: Atraumatic.  Neck: Neck supple.  Cardiovascular: Normal rate.  Pulmonary/Chest: Effort normal and breath sounds normal. He has no wheezes. He has no rales.  Neurological: He is alert and oriented to person, place, and time.  Skin: Skin is warm.  Nursing note and vitals reviewed.    ED Treatments / Results  Labs (all labs ordered are listed, but only abnormal results are displayed) Labs Reviewed  CBC WITH DIFFERENTIAL/PLATELET - Abnormal; Notable for the following components:      Result Value   RBC 1.95 (*)    Hemoglobin 6.5 (*)    HCT 20.3 (*)    MCV 104.1 (*)    RDW 21.4 (*)    Platelets 49 (*)    Neutro Abs 0.1 (*)    Lymphs Abs 4.8 (*)    All other components within normal limits  BASIC METABOLIC PANEL - Abnormal; Notable for the following components:   Glucose, Bld 156 (*)    Calcium 8.0  (*)    All other components within normal limits  BRAIN NATRIURETIC PEPTIDE - Abnormal; Notable for the following components:   B Natriuretic Peptide 155.4 (*)    All other components within normal limits  TROPONIN I - Abnormal; Notable for the following components:   Troponin I 0.03 (*)    All other components within normal limits  RETICULOCYTES  TYPE AND SCREEN  PREPARE RBC (CROSSMATCH)    EKG EKG Interpretation  Date/Time:  Friday March 03 2018 15:12:31 EDT Ventricular Rate:  82 PR Interval:    QRS Duration: 96 QT Interval:  397 QTC Calculation: 464 R Axis:   34 Text Interpretation:  Sinus rhythm No acute changes Confirmed by Kathrynn Humble, Reya Aurich [id number])  on 03/03/2018 3:34:09 PM   Radiology Dg Chest 2 View  Result Date: 03/03/2018 CLINICAL DATA:  Hypoxia EXAM: CHEST - 2 VIEW COMPARISON:  02/06/2018 FINDINGS: Cardiac shadow is within normal limits. The lungs are well aerated bilaterally. Mild increased vascular congestion is noted with early interstitial edema. Bibasilar atelectatic changes are noted. Small posterior effusions are seen. Degenerative changes of the thoracic spine are noted. IMPRESSION: Changes of mild CHF with bibasilar atelectasis. Electronically Signed   By: Inez Catalina M.D.   On: 03/03/2018 15:47    Procedures .Critical Care Performed by: Varney Biles, MD Authorized by: Varney Biles, MD   Critical care provider statement:    Critical care time (minutes):  45   Critical care start time:  03/03/2018 2:40 PM   Critical care end time:  03/03/2018 4:40 PM   Critical care time was exclusive of:  Separately billable procedures and treating other patients   Critical care was necessary to treat or prevent imminent or life-threatening deterioration of the following conditions:  Circulatory failure   Critical care was time spent personally by me on the following activities:  Discussions with consultants, evaluation of patient's response to treatment, examination  of patient, ordering and performing treatments and interventions, ordering and review of laboratory studies, ordering and review of radiographic studies, pulse oximetry, re-evaluation of patient's condition, obtaining history from patient or surrogate and review of old charts   I assumed direction of critical care for this patient from another provider in my specialty: no     (including critical care time)  Medications Ordered in ED Medications  0.9 %  sodium chloride infusion (has no administration in time range)     Initial Impression / Assessment and Plan / ED Course  I have reviewed the triage vital signs and the nursing notes.  Pertinent labs & imaging results that were available during my care of the patient were reviewed by me and considered in my medical decision making (see chart for details).     84 year old male comes in with chief complaint of shortness of breath. Patient is noted to be in no acute distress, and his pulmonary exam and cardiovascular exam is normal.  He was recently admitted to the hospital for pancytopenia and noted to be having pulmonary edema.  His echocardiogram did not reveal any significant abnormality.  We will get chest x-ray along with basic labs. If the imaging is normal and patient is not noted to be significantly anemic, then he will need CT PE.     Final Clinical Impressions(s) / ED Diagnoses   Final diagnoses:  Symptomatic anemia  Acute pulmonary edema [facility])    ED Discharge Orders    None        Varney Biles, MD 03/03/18 1640

## 2018-03-03 NOTE — ED Notes (Signed)
ED TO INPATIENT HANDOFF REPORT  Name/Age/Gender Jason Lane 84 y.o. male  Code Status Code Status History    Date Active Date Inactive Code Status Order ID Comments User Context   02/20/2018 1113 02/22/2018 1814 DNR [id number]  Loistine Chance, MD Inpatient   03/02/2018 2049 02/20/2018 1113 Full Code [id number]  Johnson-Pitts, Wells Guiles, MD Inpatient    Questions for Most Recent Historical Code Status (Order [id number])    Question Answer Comment   In the event of cardiac or respiratory ARREST Do not call a "code blue"    In the event of cardiac or respiratory ARREST Do not perform Intubation, CPR, defibrillation or ACLS    In the event of cardiac or respiratory ARREST Use medication by any route, position, wound care, and other measures to relive pain and suffering. May use oxygen, suction and manual treatment of airway obstruction as needed for comfort.         Advance Directive Documentation     Most Recent Value  Type of Advance Directive  Out of facility DNR (pink MOST or yellow form)  Pre-existing out of facility DNR order (yellow form or pink MOST form)  -  "MOST" Form in Place?  -      Home/SNF/Other Nursing Home  Chief Complaint SOB  Level of Care/Admitting Diagnosis ED Disposition    ED Disposition Condition [place]: [facility] [100102]  Level of Care: Telemetry [5]  Admit to tele based on following criteria: Acute CHF  Diagnosis: Symptomatic anemia [5427062]  Admitting Physician: Cristy Folks [[id number]]  Attending Physician: Cristy Folks [[id number]]  PT Class (Do Not Modify): Observation [104]  PT Acc Code (Do Not Modify): Observation [10022]       Medical History Past Medical History:  Diagnosis Date  . ANEMIA-IRON DEFICIENCY 07/08/2010  . ASTHMA 12/03/2009   States no history to asthma  . BENIGN PROSTATIC HYPERTROPHY 12/03/2009  . Cancer ([place])    skin cancer  . DIVERTICULOSIS, COLON 12/03/2009  . FATIGUE 12/03/2009   . GERD 12/03/2009  . HYPERLIPIDEMIA 12/03/2009  . HYPERTENSION 02/01/2010  . HYPERTENSION NEC 12/03/2009  . MALIGNANT MELANOMA, HX OF 12/03/2009  . SKIN LESION 12/03/2009  . TOBACCO USE, QUIT 12/03/2009  . TREMOR 12/03/2009    Allergies No Known Allergies  IV Location/Drains/Wounds Patient Lines/Drains/Airways Status   Active Line/Drains/Airways    Name:   Placement date:   Placement time:   Site:   Days:   Peripheral IV 03/03/18 Right Forearm   03/03/18    1505    Forearm   less than 1   External Urinary Catheter   02/25/2018    2057    -   15          Labs/Imaging Results for orders placed or performed during the hospital encounter of 03/03/18 (from the past 48 hour(s))  CBC with Differential     Status: Abnormal   Collection Time: 03/03/18  2:28 PM  Result Value Ref Range   WBC 5.3 4.0 - 10.5 K/uL   RBC 1.95 (L) 4.22 - 5.81 MIL/uL   Hemoglobin 6.5 (LL) 13.0 - 17.0 g/dL    Comment: This critical result has verified and been called to [place] by [place] on 11 01 2019 at 1548, and has been read back. Result verified.   HCT 20.3 (L) 39.0 - 52.0 %   MCV 104.1 (H) 80.0 - 100.0 fL   MCH 33.3 26.0 - 34.0  pg   MCHC 32.0 30.0 - 36.0 g/dL   RDW 21.4 (H) 11.5 - 15.5 %   Platelets 49 (L) 150 - 400 K/uL    Comment: Immature Platelet Fraction may be clinically indicated, consider ordering this additional test [id number]    Neutrophils Relative % 2 %   Neutro Abs 0.1 (L) 1.7 - 7.7 K/uL    Comment: This critical result has verified and been called to [place] by [place] on 11 01 2019 at 1548, and has been read back. Result verified.   Lymphocytes Relative 90 %   Lymphs Abs 4.8 (H) 0.7 - 4.0 K/uL   Monocytes Relative 8 %   Monocytes Absolute 0.4 0.1 - 1.0 K/uL   Eosinophils Relative 0 %   Eosinophils Absolute 0.0 0.0 - 0.5 K/uL   Basophils Relative 0 %   Basophils Absolute 0.0 0.0 - 0.1 K/uL   WBC Morphology Abnormal lymphocytes present    Abnormal Lymphocytes Present PRESENT      Comment: Performed at [facility], [place] [address] [place], [place] [address]  Basic metabolic panel     Status: Abnormal   Collection Time: 03/03/18  2:28 PM  Result Value Ref Range   Sodium 138 135 - 145 mmol/L   Potassium 3.6 3.5 - 5.1 mmol/L   Chloride 109 98 - 111 mmol/L   CO2 24 22 - 32 mmol/L   Glucose, Bld 156 (H) 70 - 99 mg/dL   BUN 21 8 - 23 mg/dL   Creatinine, Ser 0.84 0.61 - 1.24 mg/dL   Calcium 8.0 (L) 8.9 - 10.3 mg/dL   GFR calc non Af Amer >60 >60 mL/min   GFR calc Af Amer >60 >60 mL/min    Comment: (NOTE) The eGFR has been calculated using the CKD EPI equation. This calculation has not been validated in all clinical situations. eGFR's persistently <60 mL/min signify possible Chronic Kidney Disease.    Anion gap 5 5 - 15    Comment: Performed at [facility], [place] [address] [place], [place] [address]  Brain natriuretic peptide     Status: Abnormal   Collection Time: 03/03/18  2:28 PM  Result Value Ref Range   B Natriuretic Peptide 155.4 (H) 0.0 - 100.0 pg/mL    Comment: Performed at [facility], [place] [address] [place], [place] [address]  Troponin I     Status: Abnormal   Collection Time: 03/03/18  2:28 PM  Result Value Ref Range   Troponin I 0.03 (HH) <0.03 ng/mL    Comment: CRITICAL RESULT CALLED TO, READ BACK BY AND VERIFIED WITH: Bobette Mo [id number] @ [facility] Performed at [facility] [address], [place], [place] [address]    Dg Chest 2 View  Result Date: 03/03/2018 CLINICAL DATA:  Hypoxia EXAM: CHEST - 2 VIEW COMPARISON:  02/15/2018 FINDINGS: Cardiac shadow is within normal limits. The lungs are well aerated bilaterally. Mild increased vascular congestion is noted with early interstitial edema. Bibasilar atelectatic changes are noted. Small posterior effusions are seen. Degenerative changes of the thoracic spine are noted. IMPRESSION: Changes of mild CHF  with bibasilar atelectasis. Electronically Signed   By: Inez Catalina M.D.   On: 03/03/2018 15:47   EKG Interpretation  Date/Time:  Friday March 03 2018 15:12:31 EDT Ventricular Rate:  82 PR Interval:    QRS Duration: 96 QT Interval:  397 QTC Calculation: 464 R Axis:   34 Text Interpretation:  Sinus rhythm No acute changes Confirmed by Varney Biles [phone]) on 03/03/2018 3:34:09 PM   Pending Labs Unresulted Labs (From admission, onward)    Start     Ordered   03/03/18 1657  Reticulocytes  Once,   R     03/03/18 1657   03/03/18 1609  Type and screen  Once,   R     03/03/18 1609   03/03/18 1609  Prepare RBC  (Adult Blood Administration - PRBC)  Once,   R    Question Answer Comment  # of Units 2 units   Transfusion Indications Symptomatic Anemia   Number of Units to Keep Ahead 2 units ahead   If emergent release call blood bank [facility] [phone]      03/03/18 1609   Signed and Held  Basic metabolic panel  Daily,   R     Signed and Held   Signed and Held  CBC WITH DIFFERENTIAL  Tomorrow morning,   R     Signed and Held   Signed and Held  Magnesium  Tomorrow morning,   R     Signed and Held          Vitals/Pain Today's Vitals   03/03/18 1600 03/03/18 1630 03/03/18 1701 03/03/18 1730  BP: (!) 150/69 (!) 165/78 (!) 191/87 (!) 144/74  Pulse: 72 81 (!) 105 76  Resp: (!) 22 (!) 25 (!) 22 18  SpO2: 97% 95% (!) 81% 99%    Isolation Precautions No active isolations  Medications Medications  0.9 %  sodium chloride infusion (has no administration in time range)  furosemide (LASIX) injection 40 mg (has no administration in time range)    Mobility walks with person assist

## 2018-03-03 NOTE — ED Notes (Signed)
Bed: WQ37 Expected date:  Expected time:  Means of arrival:  Comments: EMS- respiratory issues

## 2018-03-03 NOTE — ED Notes (Addendum)
CRITICAL VALUE STICKER  CRITICAL VALUE: Hgb- 6.5, Absolute neutrophil 0.2  RECEIVER (on-site recipient of call): ABrawner,RN  DATE & TIME NOTIFIED: 03/03/18 1551   MD NOTIFIED: Kathrynn Humble   TIME OF NOTIFICATION:03/03/18 1551  RESPONSE: Awaiting orders

## 2018-03-03 NOTE — Progress Notes (Signed)
COMMUNITY PALLIATIVE CARE SW NOTE  PATIENT NAME: Jason Lane DOB: [birth year] MRN: [id number]  PRIMARY CARE PROVIDER: Biagio Borg, MD  RESPONSIBLE PARTY:  Acct ID - Guarantor Home Phone Work Phone Relationship Acct Type  [id number] [name], [name]  Self P/F     [place], [place], [place] [address]     PLAN OF CARE and INTERVENTIONS:             1. GOALS OF CARE/ ADVANCE CARE PLANNING:  Goal is for patient to remain at the facility.  He has a DNR. 2. SOCIAL/EMOTIONAL/SPIRITUAL ASSESSMENT/ INTERVENTIONS:  SW met with patient in his room at [facility].  He was eating his lunch independently.  Staff reported he was more confused today due to decreased O2 sats.  Patient stated he was from [place] and moved to the [place] in [place].  He said he was not married and did not have any children.  He reported being "punished" due to saying something to the commissioner's wife.  Patient would not expound on the statement.  He also denied any type of religious or spiritual beliefs.  The facility SW, Roxanne, stated patient is usually alert and oriented.  He is divorced and his ex-wife continues to provide support.  However, he requested she not be contacted and did not want her to be listed as a contact.  He said all of his affairs are in order and he is to be cremated at [facility]. 3. PATIENT/CAREGIVER EDUCATION/ COPING:  Patient copes by expressing himself openly.  Consulted patient nurse, Vincente Liberty, and facility SW, Roxanne.  Provided education regarding the Palliative Care program. 4. PERSONAL EMERGENCY PLAN:  Per facility protocol. 5. COMMUNITY RESOURCES COORDINATION/ HEALTH CARE NAVIGATION:  None. 6. FINANCIAL/LEGAL CONCERNS/INTERVENTIONS:  None per staff.     SOCIAL HX:  Social History   Tobacco Use  . Smoking status: Former Research scientist (life sciences)  . Smokeless tobacco: Never Used  . Tobacco comment: none since approx 1985  Substance Use Topics  . Alcohol use: Yes    Comment: occ  beer     CODE STATUS:  DNR  ADVANCED DIRECTIVES: N MOST FORM COMPLETE:  N HOSPICE EDUCATION PROVIDED:  N PPS:  Patient's appetite is normal per staff.  He was in a w/c today due to decreased O2 sats. Duration of visit and documentation:  60 minutes.      Creola Corn Draxton Luu, LCSW

## 2018-03-03 DEATH — deceased

## 2018-03-04 DIAGNOSIS — Z515 Encounter for palliative care: Secondary | ICD-10-CM | POA: Diagnosis not present

## 2018-03-04 DIAGNOSIS — D649 Anemia, unspecified: Secondary | ICD-10-CM | POA: Diagnosis not present

## 2018-03-04 DIAGNOSIS — C9500 Acute leukemia of unspecified cell type not having achieved remission: Principal | ICD-10-CM

## 2018-03-04 DIAGNOSIS — J811 Chronic pulmonary edema: Secondary | ICD-10-CM | POA: Diagnosis not present

## 2018-03-04 DIAGNOSIS — R4182 Altered mental status, unspecified: Secondary | ICD-10-CM | POA: Diagnosis not present

## 2018-03-04 DIAGNOSIS — J9601 Acute respiratory failure with hypoxia: Secondary | ICD-10-CM | POA: Diagnosis not present

## 2018-03-04 DIAGNOSIS — Z7189 Other specified counseling: Secondary | ICD-10-CM

## 2018-03-04 DIAGNOSIS — D61818 Other pancytopenia: Secondary | ICD-10-CM | POA: Diagnosis not present

## 2018-03-04 DIAGNOSIS — R0989 Other specified symptoms and signs involving the circulatory and respiratory systems: Secondary | ICD-10-CM | POA: Diagnosis not present

## 2018-03-04 DIAGNOSIS — I5032 Chronic diastolic (congestive) heart failure: Secondary | ICD-10-CM | POA: Diagnosis not present

## 2018-03-04 DIAGNOSIS — J15212 Pneumonia due to Methicillin resistant Staphylococcus aureus: Secondary | ICD-10-CM | POA: Diagnosis not present

## 2018-03-04 DIAGNOSIS — A419 Sepsis, unspecified organism: Secondary | ICD-10-CM | POA: Diagnosis not present

## 2018-03-04 DIAGNOSIS — R6520 Severe sepsis without septic shock: Secondary | ICD-10-CM | POA: Diagnosis not present

## 2018-03-04 LAB — TYPE AND SCREEN
ABO/RH(D): AB POS
ANTIBODY SCREEN: NEGATIVE
UNIT DIVISION: 0
UNIT DIVISION: 0

## 2018-03-04 LAB — CBC WITH DIFFERENTIAL/PLATELET
Basophils Absolute: 0 K/uL (ref 0.0–0.1)
Basophils Relative: 0 %
Eosinophils Absolute: 0 K/uL (ref 0.0–0.5)
Eosinophils Relative: 0 %
HCT: 27.6 % — ABNORMAL LOW (ref 39.0–52.0)
Hemoglobin: 8.9 g/dL — ABNORMAL LOW (ref 13.0–17.0)
Lymphocytes Relative: 19 %
Lymphs Abs: 1.6 K/uL (ref 0.7–4.0)
MCH: 31.9 pg (ref 26.0–34.0)
MCHC: 32.2 g/dL (ref 30.0–36.0)
MCV: 98.9 fL (ref 80.0–100.0)
Monocytes Absolute: 0 10*3/uL — ABNORMAL LOW (ref 0.1–1.0)
Monocytes Relative: 0 %
Neutro Abs: 0.3 10*3/uL — ABNORMAL LOW (ref 1.7–7.7)
Neutrophils Relative %: 3 %
Other: 78 %
Platelets: 48 K/uL — ABNORMAL LOW (ref 150–400)
RBC: 2.79 MIL/uL — ABNORMAL LOW (ref 4.22–5.81)
RDW: 20.4 % — ABNORMAL HIGH (ref 11.5–15.5)
WBC: 8.6 10*3/uL (ref 4.0–10.5)
nRBC: 0.5 % — ABNORMAL HIGH (ref 0.0–0.2)

## 2018-03-04 LAB — BASIC METABOLIC PANEL
BUN: 19 mg/dL (ref 8–23)
CO2: 28 mmol/L (ref 22–32)
Calcium: 8.2 mg/dL — ABNORMAL LOW (ref 8.9–10.3)
Chloride: 105 mmol/L (ref 98–111)
Creatinine, Ser: 0.64 mg/dL (ref 0.61–1.24)
GFR calc non Af Amer: >60 mL/min (ref >60)
Glucose, Bld: 96 mg/dL (ref 70–99)
Potassium: 3.8 mmol/L (ref 3.5–5.1)
Sodium: 140 mmol/L (ref 135–145)

## 2018-03-04 LAB — BPAM RBC
Blood Product Expiration Date: 201911242359
Blood Product Expiration Date: 201911242359
ISSUE DATE / TIME: 201911012034
ISSUE DATE / TIME: 201911012332
Unit Type and Rh: 6200
Unit Type and Rh: 6200

## 2018-03-04 LAB — BASIC METABOLIC PANEL WITH GFR
Anion gap: 7 (ref 5–15)
GFR calc Af Amer: >60 mL/min (ref >60)

## 2018-03-04 LAB — MAGNESIUM: Magnesium: 2 mg/dL (ref 1.7–2.4)

## 2018-03-04 MED ORDER — FUROSEMIDE 20 MG PO TABS: 20.0000 mg | ORAL_TABLET | Freq: Every day | ORAL | 1 refills | Status: DC

## 2018-03-04 NOTE — ED Provider Notes (Addendum)
[facility] Provider Note   CSN: [id number] Arrival date & time: 03/03/18  1415     History   Chief Complaint Chief Complaint  Patient presents with  . Shortness of Breath    HPI Jason Lane is a 84 y.o. male.  HPI  84 year old male comes in with chief complaint of shortness of breath. Patient has history of leukemia, hypertension, hyperlipidemia and was recently admitted for pancytopenia.  He reports that after being discharged he was doing well until yesterday, when he started having some tolerance issues with exercise.  According to EMS, when nursing home assessed the patient he was noted to have O2 sats in the 80s.  EMS put patient on 2 L of nasal cannula and oxygen saturation improved to 99%.  Patient has been having a cough for the past few days.  He denies any nausea, vomiting, fevers, chills, shortness of breath while laying supine or chest pain.  Past Medical History:  Diagnosis Date  . ANEMIA-IRON DEFICIENCY 07/08/2010  . ASTHMA 12/03/2009   States no history to asthma  . BENIGN PROSTATIC HYPERTROPHY 12/03/2009  . Cancer ([place])    skin cancer  . DIVERTICULOSIS, COLON 12/03/2009  . FATIGUE 12/03/2009  . GERD 12/03/2009  . HYPERLIPIDEMIA 12/03/2009  . HYPERTENSION 02/01/2010  . HYPERTENSION NEC 12/03/2009  . MALIGNANT MELANOMA, HX OF 12/03/2009  . SKIN LESION 12/03/2009  . TOBACCO USE, QUIT 12/03/2009  . TREMOR 12/03/2009    Patient Active Problem List   Diagnosis Date Noted  . Symptomatic anemia 03/03/2018  . Acute leukemia ([place]) 02/22/2018  . Palliative care by specialist   . Goals of care, counseling/discussion   . Pancytopenia ([place]) 02/11/2018  . Severe anemia 02/15/2018  . Rhabdomyolysis 02/28/2018  . Pulmonary edema 02/15/2018  . Elevated troponin mild 02/28/2018  . Gait disorder 08/01/2017  . BPPV (benign paroxysmal positional vertigo) 08/01/2017  . Gross hematuria 03/30/2017  . BRBPR (bright red blood per rectum)  03/30/2017  . Rotator cuff tear arthropathy of both shoulders 11/30/2016  . Hyperglycemia 06/29/2016  . S/P laparoscopic hernia repair 06/24/2015  . Inguinal hernia, left 05/27/2015  . Skin lesion 05/27/2015  . Bradycardia 12/24/2014  . Chest pain 06/23/2012  . Peripheral neuropathy 12/19/2011  . Hematochezia 12/15/2011  . Leg cramps 12/15/2011  . Bladder neck obstruction 12/15/2011  . Vertigo 09/11/2011  . Preventative health care 09/09/2010  . ANEMIA-IRON DEFICIENCY 07/08/2010  . Essential hypertension 02/01/2010  . Hyperlipidemia 12/03/2009  . Asthma 12/03/2009  . GERD 12/03/2009  . DIVERTICULOSIS, COLON 12/03/2009  . BPH associated with nocturia 12/03/2009  . FATIGUE 12/03/2009  . TREMOR 12/03/2009  . MALIGNANT MELANOMA, HX OF 12/03/2009  . TOBACCO USE, QUIT 12/03/2009    Past Surgical History:  Procedure Laterality Date  . COLONOSCOPY    . HERNIA REPAIR    . INGUINAL HERNIA REPAIR Left 06/24/2015   Procedure: LAPAROSCOPIC REPAIR RECURRENT LEFT INGUINAL HERNIA;  Surgeon: Greer Pickerel, MD;  Location: [facility];  Service: General;  Laterality: Left;  . inguinal herniorrhapy  1970   right  . INSERTION OF MESH Left 06/24/2015   Procedure: INSERTION OF MESH;  Surgeon: Greer Pickerel, MD;  Location: [facility];  Service: General;  Laterality: Left;  . s/p left leg melanoma          Home Medications    Prior to Admission medications   Medication Sig Start Date End Date Taking? Authorizing Provider  co-enzyme Q-10 30 MG capsule Take 30 mg by  mouth 3 (three) times daily.   Yes [provider]  gabapentin (NEURONTIN) 100 MG capsule Take 1 capsule (100 mg total) by mouth at bedtime. 02/22/18  Yes Charlynne Cousins, MD  meclizine (ANTIVERT) 12.5 MG tablet Take 1 tablet (12.5 mg total) by mouth 3 (three) times daily as needed for dizziness. 08/15/17 08/15/18 Yes Biagio Borg, MD  Multiple Vitamin (MULTIVITAMIN) capsule Take 1 capsule by mouth daily.     Yes [provider]  tamsulosin (FLOMAX) 0.4 MG CAPS capsule TAKE 1 CAPSULE BY MOUTH EVERY DAY Patient taking differently: Take 0.4 mg by mouth every evening.  06/06/17  Yes Biagio Borg, MD  furosemide (LASIX) 20 MG tablet Take 1 tablet (20 mg total) by mouth daily. 03/04/18 03/04/19  Purohit, Konrad Dolores, MD    Family History Family History  Problem Relation Age of Onset  . Colon cancer Father     Social History Social History   Tobacco Use  . Smoking status: Former Research scientist (life sciences)  . Smokeless tobacco: Never Used  . Tobacco comment: none since approx 1985  Substance Use Topics  . Alcohol use: Yes    Comment: occ beer   . Drug use: No     Allergies   Patient has no known allergies.   Review of Systems Review of Systems  Constitutional: Positive for activity change.  Respiratory: Positive for shortness of breath. Negative for chest tightness.   Cardiovascular: Negative for chest pain.  Gastrointestinal: Negative for nausea and vomiting.  Hematological: Does not bruise/bleed easily.  All other systems reviewed and are negative.    Physical Exam Updated Vital Signs BP 133/80 (BP Location: Left Arm)   Pulse 78   Temp 98.3 F (36.8 C) (Oral)   Resp 16   SpO2 92%   Physical Exam  Constitutional: He appears well-developed.  HENT:  Head: Atraumatic.  Neck: Neck supple.  Cardiovascular: Normal rate.  Pulmonary/Chest: Effort normal. He has no decreased breath sounds. He has no wheezes. He has no rhonchi. He has rales in the right lower field and the left lower field.  Musculoskeletal:       Right lower leg: He exhibits no tenderness and no edema.       Left lower leg: He exhibits no tenderness and no edema.  Neurological: He is alert.  Skin: Skin is warm.  Nursing note and vitals reviewed.    ED Treatments / Results  Labs (all labs ordered are listed, but only abnormal results are displayed) Labs Reviewed  CBC WITH DIFFERENTIAL/PLATELET - Abnormal; Notable for the following components:        Result Value   RBC 1.95 (*)    Hemoglobin 6.5 (*)    HCT 20.3 (*)    MCV 104.1 (*)    RDW 21.4 (*)    Platelets 49 (*)    Neutro Abs 0.1 (*)    Lymphs Abs 4.8 (*)    All other components within normal limits  BASIC METABOLIC PANEL - Abnormal; Notable for the following components:   Glucose, Bld 156 (*)    Calcium 8.0 (*)    All other components within normal limits  BRAIN NATRIURETIC PEPTIDE - Abnormal; Notable for the following components:   B Natriuretic Peptide 155.4 (*)    All other components within normal limits  TROPONIN I - Abnormal; Notable for the following components:   Troponin I 0.03 (*)    All other components within normal limits  RETICULOCYTES - Abnormal; Notable for  the following components:   RBC. 2.04 (*)    Immature Retic Fract 18.5 (*)    All other components within normal limits  BASIC METABOLIC PANEL - Abnormal; Notable for the following components:   Calcium 8.2 (*)    All other components within normal limits  CBC WITH DIFFERENTIAL/PLATELET - Abnormal; Notable for the following components:   RBC 2.79 (*)    Hemoglobin 8.9 (*)    HCT 27.6 (*)    RDW 20.4 (*)    Platelets 48 (*)    nRBC 0.5 (*)    Neutro Abs 0.3 (*)    Monocytes Absolute 0.0 (*)    All other components within normal limits  MAGNESIUM  PATHOLOGIST SMEAR REVIEW  TYPE AND SCREEN  PREPARE RBC (CROSSMATCH)    EKG EKG Interpretation  Date/Time:  Friday March 03 2018 15:12:31 EDT Ventricular Rate:  82 PR Interval:    QRS Duration: 96 QT Interval:  397 QTC Calculation: 464 R Axis:   34 Text Interpretation:  Sinus rhythm No acute changes Confirmed by Varney Biles ([id number]) on 03/03/2018 3:34:09 PM   Radiology Dg Chest 2 View  Result Date: 03/03/2018 CLINICAL DATA:  Hypoxia EXAM: CHEST - 2 VIEW COMPARISON:  02/22/2018 FINDINGS: Cardiac shadow is within normal limits. The lungs are well aerated bilaterally. Mild increased vascular congestion is noted with early  interstitial edema. Bibasilar atelectatic changes are noted. Small posterior effusions are seen. Degenerative changes of the thoracic spine are noted. IMPRESSION: Changes of mild CHF with bibasilar atelectasis. Electronically Signed   By: Inez Catalina M.D.   On: 03/03/2018 15:47    Procedures .Critical Care Performed by: Varney Biles, MD Authorized by: Varney Biles, MD   Critical care provider statement:    Critical care time (minutes):  45   Critical care was necessary to treat or prevent imminent or life-threatening deterioration of the following conditions:  Circulatory failure   Critical care was time spent personally by me on the following activities:  Discussions with consultants, evaluation of patient's response to treatment, examination of patient, ordering and performing treatments and interventions, ordering and review of laboratory studies, ordering and review of radiographic studies, pulse oximetry, re-evaluation of patient's condition, obtaining history from patient or surrogate and review of old charts   (including critical care time)  Medications Ordered in ED Medications  furosemide (LASIX) injection 40 mg (40 mg Intravenous Given 03/03/18 2006)     Initial Impression / Assessment and Plan / ED Course  I have reviewed the triage vital signs and the nursing notes.  Pertinent labs & imaging results that were available during my care of the patient were reviewed by me and considered in my medical decision making (see chart for details).  Clinical Course as of Mar 05 1045  Fri Mar 03, 2018  1705 Discussed with hospitalist regarding admission.  They asked if I would order him 40 of Lasix now prior to transfusion.   [MB]    Clinical Course User Index [MB] Hayden Rasmussen, MD   84 year old male with history of leukemia and recent admission for pancytopenia comes in with chief complaint of shortness of breath.  Initial differential diagnosis includes CHF, PE, severe  anemia, pneumonia.  Patient today is noted to have hemoglobin of 6.5. He has not had any bloody stools.  It appears that he is again having worsening of his pancytopenia, as the platelet count is also low.  Blood transfusion will be started in the ED. chest x-ray does  show some pulmonary edema.  He had an echocardiogram during the recent admission which showed preserved EF.  Patient will likely need Lasix in between the transfusion, and Dr. Melina Copa, who was taken over the signout will informed of this to the admitting team.  We will defer evaluation for platelet transfusion to the admitting team.   Final Clinical Impressions(s) / ED Diagnoses   Final diagnoses:  Symptomatic anemia  Acute pulmonary edema [facility])    ED Discharge Orders         Ordered    furosemide (LASIX) 20 MG tablet  Daily     03/04/18 1010    Increase activity slowly     03/04/18 1010    Diet - low sodium heart healthy     03/04/18 1010    Discharge instructions    Comments:  Please follow-up with your outpatient palliative care doctor to discuss hospice.   03/04/18 1010    Call MD for:  temperature >100.4     03/04/18 1010    Call MD for:  persistant nausea and vomiting     03/04/18 1010    Call MD for:  severe uncontrolled pain     03/04/18 1010    Call MD for:  redness, tenderness, or signs of infection (pain, swelling, redness, odor or green/yellow discharge around incision site)     03/04/18 1010    Call MD for:  difficulty breathing, headache or visual disturbances     03/04/18 1010    Call MD for:  hives     03/04/18 1010    Call MD for:  persistant dizziness or light-headedness     03/04/18 1010    Call MD for:  extreme fatigue     03/04/18 1010           Varney Biles, MD 03/04/18 0867     Varney Biles, MD 03/05/18 1047

## 2018-03-04 NOTE — Clinical Social Work Note (Signed)
Patient recently assessed on 10/22 and discharged to [facility] at [facility], no psychosocial changes reported. Please see assessment below.  Facility confirmed patient's ability to return with palliative follow up and explained that if patient returned with Hospice that it would not be covered under Medicare and that patient would have to private pay for SNF.   CSW explained patient's discharge options to patient, patient confirmed plan to return to SNF with palliative care follow up. CSW will complete FL2 and provide to SNF. CSW will continue to follow and assist with discharge planning.   Patient to return to SNF to receive comfort care from SNF and palliative.   Clinical Social Work Assessment  Patient Details  Name: Jason Lane MRN: [id number] Date of Birth: 21-May-1927  Date of referral:                Reason for consult:                Permission sought to share information with:    Permission granted to share information::     Name::        Agency::     Relationship::     Contact Information:     Housing/Transportation Living arrangements for the past 2 months:    Source of Information:    Patient Interpreter Needed:    Criminal Activity/Legal Involvement Pertinent to Current Situation/Hospitalization:    Significant Relationships:    Lives with:    Do you feel safe going back to the place where you live?    Need for family participation in patient care:     Care giving concerns:  Pt admitted from home where he resides alone. Admitted after coming to ED via EMS for worsening weakness (presumed fall), and findings were consistent with rhabdomyolysis.  Pt states he lives alone and has not family/friends. Used a cane which used to belong to his sister. Pt states he was independent up until a few months ago, could not elaborate. CSW review of chart showed pt had Outpatient PT in April of this year after presenting to PCP with increased weakness/dizziness. States he has  no major medical history.  During this hospitalization has been found to have leukemia, no treatment options.  Social Worker assessment / plan:  CSW consulted to assist with pt's disposition as he has been diagnosed with terminal illness and has no identified support system. Met with pt at bedside and discussed brief hx and care needs above. Pt was slow to respond at times but did respond appropriately to questions. Did not discuss treatment here at length so could not gauge how oriented he is to situation, however he did seem to understand that it is being recommended he not return home alone and that he could benefit from hospice/palliative care and from rehab at SNF if his goal is to rehab and return home. (pt stated he prefers to go home at [place] but also admitted to [facility] he does not have support there and that he is not able to be safe if he returns in state prior to admission). Pt was oriented except to time (stated date was Oct 20th rather than Oct 22). Review of chart showed no hx of cognitive issues. Pt's slow responses at times prompted CSW to ask teach back questions so he could have opportunity to demonstrate understanding of this conversation. He did answer appropriately if not elaborately.  Pt did agree that "if he needs to go to a rehab that is okay."  CSW inquired as to interest in hospice/palliative services (has been discussing with PMT) however pt was unable to agree or disagree, did not really engage. This needs to be investigated further if pt understands differences between rehab goals and hospice- centered goals.   CSW will follow to assist pt with disposition. Need to pursue SNF if pt decides, otherwise refer to hospice  Employment status:    Insurance information:    PT Recommendations:    Information / Referral to community resources:     Patient/Family's Response to care:  Pt thanked CSW for visit  Patient/Family's Understanding of and Emotional Response to Diagnosis, Current  Treatment, and Prognosis:  See above. Emotionally very pleasant but not demonstrative.   Emotional Assessment Appearance:    Attitude/Demeanor/Rapport:    Affect (typically observed):    Orientation:    Alcohol / Substance use:    Psych involvement (Current and /or in the community):     Discharge Needs  Concerns to be addressed:    Readmission within the last 30 days:    Current discharge risk:    Barriers to Discharge:      Burnis Medin, LCSW 03/04/2018, 1:03 PM  [phone]

## 2018-03-04 NOTE — NC FL2 (Signed)
[place] LEVEL OF CARE SCREENING TOOL     IDENTIFICATION  Patient Name: Jason Lane Birthdate: 11/02/1927 Sex: male Admission Date (Current Location): 03/03/2018  [facility] and [place] Number:  Herbalist and Address:  [facility],  [place] [address], [place]      Provider Number: [id number]  Attending Physician Name and Address:  Cristy Folks, MD  Relative Name and Phone Number:       Current Level of Care: Hospital Recommended Level of Care: [place] Prior Approval Number:    Date Approved/Denied:   PASRR Number: [id number]  Discharge Plan: SNF    Current Diagnoses: Patient Active Problem List   Diagnosis Date Noted  . Symptomatic anemia 03/03/2018  . Acute leukemia ([place]) 02/22/2018  . Palliative care by specialist   . Goals of care, counseling/discussion   . Pancytopenia ([place]) 02/15/2018  . Severe anemia 02/10/2018  . Rhabdomyolysis 02/11/2018  . Pulmonary edema 02/06/2018  . Elevated troponin mild 03/02/2018  . Gait disorder 08/01/2017  . BPPV (benign paroxysmal positional vertigo) 08/01/2017  . Gross hematuria 03/30/2017  . BRBPR (bright red blood per rectum) 03/30/2017  . Rotator cuff tear arthropathy of both shoulders 11/30/2016  . Hyperglycemia 06/29/2016  . S/P laparoscopic hernia repair 06/24/2015  . Inguinal hernia, left 05/27/2015  . Skin lesion 05/27/2015  . Bradycardia 12/24/2014  . Chest pain 06/23/2012  . Peripheral neuropathy 12/19/2011  . Hematochezia 12/15/2011  . Leg cramps 12/15/2011  . Bladder neck obstruction 12/15/2011  . Vertigo 09/11/2011  . Preventative health care 09/09/2010  . ANEMIA-IRON DEFICIENCY 07/08/2010  . Essential hypertension 02/01/2010  . Hyperlipidemia 12/03/2009  . Asthma 12/03/2009  . GERD 12/03/2009  . DIVERTICULOSIS, COLON 12/03/2009  . BPH associated with nocturia 12/03/2009  . FATIGUE 12/03/2009  . TREMOR 12/03/2009  . MALIGNANT MELANOMA,  HX OF 12/03/2009  . TOBACCO USE, QUIT 12/03/2009    Orientation RESPIRATION BLADDER Height & Weight     Self, Situation, Place  O2(2L/min) Continent Weight:   Height:     BEHAVIORAL SYMPTOMS/MOOD NEUROLOGICAL BOWEL NUTRITION STATUS      Incontinent Diet(regular)  AMBULATORY STATUS COMMUNICATION OF NEEDS Skin   Extensive Assist Verbally Normal                       Personal Care Assistance Level of Assistance  Bathing, Feeding, Dressing Bathing Assistance: Maximum assistance Feeding assistance: Independent Dressing Assistance: Maximum assistance     Functional Limitations Info  Sight, Hearing, Speech Sight Info: Impaired Hearing Info: Impaired Speech Info: Adequate    SPECIAL CARE FACTORS FREQUENCY  PT (By licensed PT)     PT Frequency: 3x              Contractures Contractures Info: Not present    Additional Factors Info  Code Status, Allergies Code Status Info: DNR Allergies Info: NKA           Current Medications (03/04/2018):  This is the current hospital active medication list Current Facility-Administered Medications  Medication Dose Route Frequency Provider Last Rate Last Dose  . 0.9 %  sodium chloride infusion  10 mL/hr Intravenous Once Purohit, Shrey C, MD      . 0.9 %  sodium chloride infusion  250 mL Intravenous PRN Purohit, Shrey C, MD      . acetaminophen (TYLENOL) tablet 650 mg  650 mg Oral Q4H PRN Purohit, Konrad Dolores, MD      . ALPRAZolam (  XANAX) tablet 0.25 mg  0.25 mg Oral BID PRN Purohit, Shrey C, MD   0.25 mg at 03/03/18 2324  . furosemide (LASIX) injection 40 mg  40 mg Intravenous BID Purohit, Shrey C, MD   40 mg at 03/04/18 1150  . gabapentin (NEURONTIN) capsule 100 mg  100 mg Oral QHS Purohit, Shrey C, MD   100 mg at 03/03/18 2324  . meclizine (ANTIVERT) tablet 12.5 mg  12.5 mg Oral TID PRN Purohit, Shrey C, MD      . multivitamin with minerals tablet 1 tablet  1 tablet Oral Daily Purohit, Konrad Dolores, MD   1 tablet at 03/04/18 1150  .  ondansetron (ZOFRAN) injection 4 mg  4 mg Intravenous Q6H PRN Purohit, Shrey C, MD      . sodium chloride flush (NS) 0.9 % injection 3 mL  3 mL Intravenous Q12H Purohit, Shrey C, MD   3 mL at 03/04/18 1150  . sodium chloride flush (NS) 0.9 % injection 3 mL  3 mL Intravenous PRN Purohit, Shrey C, MD      . tamsulosin (FLOMAX) capsule 0.4 mg  0.4 mg Oral QPM Purohit, Shrey C, MD   0.4 mg at 03/03/18 2324     Discharge Medications: Please see discharge summary for a list of discharge medications.  Relevant Imaging Results:  Relevant Lab Results:   Additional Information SS# [id number]  Palliative Care follow up  Burnis Medin, LCSW

## 2018-03-04 NOTE — Progress Notes (Signed)
Patient returning to [facility] at [facility] with palliative care services following. Facility aware of patient's discharge and confirmed patient's ability to return. PTAR contacted, patient reported that he has no family or friends to be notified. Patient's RN can call report to [facility], packet complete. CSW signing off, no other needs identified at this time.  Abundio Miu, [place] Social Worker Weekend Cell#: [phone]

## 2018-03-04 NOTE — Consult Note (Signed)
Consultation Note Date: 03/04/2018   Patient Name: Jason Lane  DOB: [birth year]  MRN: [id number]  Age / Sex: 84 y.o., male  PCP: Biagio Borg, MD Referring Physician: Cristy Folks, MD  Reason for Consultation: Establishing goals of care  HPI/Patient Profile: 84 y.o. male   admitted on 03/03/2018    Life limiting illness: possible acute leukemia.   Clinical Assessment and Goals of Care: 84 yo gentleman, originally form [place] has lived in this country for 50+ years, no next of kin, known to PMT service from a recent hospitalization, recently diagnosed with acute leukemia. Patient was sent to SNF, palliative services following.   Patient was re admitted to hospital medicine service with low Hgb, weakness, acute hypoxic resp failure and chronic diastolic heart failure.   A PMT consult was requested for ongoing goals of care discussions.   The patient is awake alert, sitting up by the side of his bed, he is able to feed himself, he has finished 2 containers of apple sauce. He does have some wheezing, mild cough, admits to some shortness of breath. He denies chest pain.   I introduced myself and palliative care as follows: Palliative medicine is specialized medical care for people living with serious illness. It focuses on providing relief from the symptoms and stress of a serious illness. The goal is to improve quality of life for both the patient and the family.  The patient states that he is at utmost peace with his imminent demise, he is ready to die, is aware that he has Leukemia. We discussed about end of life signs and symptoms, role of hospice going forward.   He has all of his affairs in order, he is some what upset that he has lost his cell phone. He used to like reading books on space travel. He denies any existential distress or suffering.   Please note additional discussions  below.    NEXT OF KIN  none  He has an ex wife, whom he doesn't want contacted or notified, we don't have her number.   SUMMARY OF RECOMMENDATIONS   DNR DNI Patient ok to go back to his facility with hospice following Anticipate patient needing residential hospice in the near future Prognosis few weeks at best Continue current mode of care Patient states that he has cheated death, yet again, he states he is ready to die and is at peace with his condition, he doesn't want recurrent hospitalizations in the future. He doesn't want anyone notified of his imminent demise. He states he has made arrangements with [facility].  Code Status/Advance Care Planning:  DNR    Symptom Management:   As above    Palliative Prophylaxis:   Bowel Regimen  Additional Recommendations (Limitations, Scope, Preferences):  Full Comfort Care  Psycho-social/Spiritual:   Desire for further Chaplaincy support:yes  Additional Recommendations: Education on Hospice  Prognosis:   < 4 weeks  Discharge Planning: [place] with Hospice      Primary Diagnoses: Present on Admission: .  Severe anemia . Pancytopenia ([place]) . Essential hypertension . BPH associated with nocturia . Acute leukemia ([place]) . Asthma . Hyperlipidemia . Pulmonary edema . Symptomatic anemia   I have reviewed the medical record, interviewed the patient and family, and examined the patient. The following aspects are pertinent.  Past Medical History:  Diagnosis Date  . ANEMIA-IRON DEFICIENCY 07/08/2010  . ASTHMA 12/03/2009   States no history to asthma  . BENIGN PROSTATIC HYPERTROPHY 12/03/2009  . Cancer ([place])    skin cancer  . DIVERTICULOSIS, COLON 12/03/2009  . FATIGUE 12/03/2009  . GERD 12/03/2009  . HYPERLIPIDEMIA 12/03/2009  . HYPERTENSION 02/01/2010  . HYPERTENSION NEC 12/03/2009  . MALIGNANT MELANOMA, HX OF 12/03/2009  . SKIN LESION 12/03/2009  . TOBACCO USE, QUIT 12/03/2009  . TREMOR 12/03/2009   Social  History   Socioeconomic History  . Marital status: Single    Spouse name: Not on file  . Number of children: Not on file  . Years of education: Not on file  . Highest education level: Not on file  Occupational History  . 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 Financial resource strain: Not on file  . Food insecurity:    Worry: Not on file    Inability: Not on file  . Transportation needs:    Medical: Not on file    Non-medical: Not on file  Tobacco Use  . Smoking status: Former Research scientist (life sciences)  . Smokeless tobacco: Never Used  . Tobacco comment: none since approx 1985  Substance and Sexual Activity  . Alcohol use: Yes    Comment: occ beer   . Drug use: No  . Sexual activity: Not on file  Lifestyle  . Physical activity:    Days per week: Not on file    Minutes per session: Not on file  . Stress: Not on file  Relationships  . Social connections:    Talks on phone: Not on file    Gets together: Not on file    Attends religious service: Not on file    Active member of club or organization: Not on file    Attends meetings of clubs or organizations: Not on file    Relationship status: Not on file  Other Topics Concern  . Not on file  Social History Narrative   Just moved to [place]   Originally from [place] in [place] in [employer] in [place] with gardening/has a living will   No children/never married   Family History  Problem Relation Age of Onset  . Colon cancer Father    Scheduled Meds: . furosemide  40 mg Intravenous BID  . gabapentin  100 mg Oral QHS  . multivitamin with minerals  1 tablet Oral Daily  . sodium chloride flush  3 mL Intravenous Q12H  . tamsulosin  0.4 mg Oral QPM   Continuous Infusions: . sodium chloride    . sodium chloride     PRN Meds:.sodium chloride, acetaminophen, ALPRAZolam, meclizine, ondansetron (ZOFRAN) IV, sodium chloride flush Medications Prior to Admission:  Prior to Admission medications   Medication Sig  Start Date End Date Taking? Authorizing Provider  co-enzyme Q-10 30 MG capsule Take 30 mg by mouth 3 (three) times daily.   Yes [provider]  gabapentin (NEURONTIN) 100 MG capsule Take 1 capsule (100 mg total) by mouth at bedtime. 02/22/18  Yes Charlynne Cousins, MD  meclizine (ANTIVERT) 12.5 MG tablet Take 1 tablet (12.5 mg total)  by mouth 3 (three) times daily as needed for dizziness. 08/15/17 08/15/18 Yes Biagio Borg, MD  Multiple Vitamin (MULTIVITAMIN) capsule Take 1 capsule by mouth daily.     Yes [provider]  tamsulosin (FLOMAX) 0.4 MG CAPS capsule TAKE 1 CAPSULE BY MOUTH EVERY DAY Patient taking differently: Take 0.4 mg by mouth every evening.  06/06/17  Yes Biagio Borg, MD  furosemide (LASIX) 20 MG tablet Take 1 tablet (20 mg total) by mouth daily. 03/04/18 03/04/19  Purohit, Konrad Dolores, MD   No Known Allergies Review of Systems Does admit to some shortness of breath off and on.   Physical Exam Frail pale appearing gentleman In no distress Is able to feed himself apple sauce Some coarse rhonchi and scattered wheezes posteriorly on auscultation Has a kyphotic back Abdomen is soft Some scratches/bruises on both knees Awake alert oriented S 1 S 2   Vital Signs: BP (!) 158/67 (BP Location: Left Arm)   Pulse 71   Temp 98 F (36.7 C) (Oral)   Resp 20   SpO2 94%  Pain Scale: 0-10   Pain Score: 0-No pain   SpO2: SpO2: 94 % O2 Device:SpO2: 94 % O2 Flow Rate: .O2 Flow Rate (L/min): 2 L/min  IO: Intake/output summary:   Intake/Output Summary (Last 24 hours) at 03/04/2018 1015 Last data filed at 03/04/2018 0951 Gross per 24 hour  Intake 2008 ml  Output 20000 ml  Net -17992 ml    LBM:   Baseline Weight:   Most recent weight:       Palliative Assessment/Data:    PPS 40%  Time In:  9 Time Out:10   Time Total:  60 min  Greater than 50%  of this time was spent counseling and coordinating care related to the above assessment and plan.  Signed  by: Loistine Chance, MD [id number]  Please contact Palliative Medicine Team phone at [phone] for questions and concerns.  For individual provider: See Shea Evans

## 2018-03-04 NOTE — Discharge Summary (Signed)
Physician Discharge Summary  Jason Lane LZJ:[id number] DOB: [birth year] DOA: 03/03/2018  PCP: Biagio Borg, MD  Admit date: 03/03/2018 Discharge date: 03/04/2018  Admitted From: SNF Disposition:  SNF  Recommendations for Outpatient Follow-up:  1. Follow up with PCP in 1-2 weeks 2. Please follow up with your palliative care doctor to discuss inpatient hospice.  Home Health: No Equipment/Devices: None  Discharge Condition: Stable CODE STATUS: DNR Diet recommendation: Regular diet  Brief/Interim Summary:  #) Symptom medic anemia: Patient was admitted with hemoglobin of approximately 6.5.  He did not have any active bleeding but this was attributed to hypoproliferative anemia secondary to pulmonary infiltration from acute leukemia.  He was given 2 packed units of red blood cells with diuresis which is outlined below.  Recheck hemoglobin was 8.9.  #) Acute hypoxic respiratory failure due to acute on chronic diastolic heart failure: Patient was admitted with pulmonary infiltrates, lower extremity edema.  Suspect likely high-output heart failure due to his anemia.  He was given IV diuresis with furosemide with resolution of lower extremity edema and respiratory failure.  Patient was discharged home on oral furosemide.  #) Acute leukemia: This was a recent diagnosis for the patient.  He was not deemed a candidate for any additional chemotherapy.  He was discharged to skilled nursing facility due to his poor functional status.  He is interested in inpatient hospice and no longer wants to be hospitalized.  He will follow-up with his outpatient hospice provider for this.  #) hypertension/hyperlipidemia: He is currently not being treated for this.  #) Peripheral neuropathy: Patient was continued on home gabapentin 100 mg.  #)  BPH: Patient was continued on home tamsulosin.  Discharge Diagnoses:  Active Problems:   Hyperlipidemia   Essential hypertension   Asthma   BPH associated with  nocturia   Peripheral neuropathy   Pancytopenia ([facility])   Severe anemia   Pulmonary edema   Acute leukemia ([facility])   Symptomatic anemia    Discharge Instructions  Discharge Instructions    Call MD for:  difficulty breathing, headache or visual disturbances   Complete by:  As directed    Call MD for:  extreme fatigue   Complete by:  As directed    Call MD for:  hives   Complete by:  As directed    Call MD for:  persistant dizziness or light-headedness   Complete by:  As directed    Call MD for:  persistant nausea and vomiting   Complete by:  As directed    Call MD for:  redness, tenderness, or signs of infection (pain, swelling, redness, odor or green/yellow discharge around incision site)   Complete by:  As directed    Call MD for:  severe uncontrolled pain   Complete by:  As directed    Call MD for:  temperature >100.4   Complete by:  As directed    Diet - low sodium heart healthy   Complete by:  As directed    Discharge instructions   Complete by:  As directed    Please follow-up with your outpatient palliative care doctor to discuss hospice.   Increase activity slowly   Complete by:  As directed      Allergies as of 03/04/2018   No Known Allergies     Medication List    TAKE these medications   co-enzyme Q-10 30 MG capsule Take 30 mg by mouth 3 (three) times daily.   furosemide 20 MG tablet Commonly known as:  LASIX Take 1 tablet (20 mg total) by mouth daily.   gabapentin 100 MG capsule Commonly known as:  NEURONTIN Take 1 capsule (100 mg total) by mouth at bedtime.   meclizine 12.5 MG tablet Commonly known as:  ANTIVERT Take 1 tablet (12.5 mg total) by mouth 3 (three) times daily as needed for dizziness.   multivitamin capsule Take 1 capsule by mouth daily.   tamsulosin 0.4 MG Caps capsule Commonly known as:  FLOMAX TAKE 1 CAPSULE BY MOUTH EVERY DAY What changed:  when to take this       No Known  Allergies  Consultations:  None   Procedures/Studies: Dg Chest 2 View  Result Date: 03/03/2018 CLINICAL DATA:  Hypoxia EXAM: CHEST - 2 VIEW COMPARISON:  02/04/2018 FINDINGS: Cardiac shadow is within normal limits. The lungs are well aerated bilaterally. Mild increased vascular congestion is noted with early interstitial edema. Bibasilar atelectatic changes are noted. Small posterior effusions are seen. Degenerative changes of the thoracic spine are noted. IMPRESSION: Changes of mild CHF with bibasilar atelectasis. Electronically Signed   By: Inez Catalina M.D.   On: 03/03/2018 15:47   Dg Chest 2 View  Result Date: 02/26/2018 CLINICAL DATA:  [place] today.  Generalized weakness. EXAM: CHEST - 2 VIEW COMPARISON:  06/23/2012 FINDINGS: Heart size upper limits of normal. Chronic aortic atherosclerosis. Bilateral pleural effusions layering dependently with dependent atelectasis. Mild interstitial edema. No acute bone finding. IMPRESSION: Mild interstitial edema. Small effusions layering dependently with dependent atelectasis. Findings most consistent with congestive heart failure. Could not rule out basilar pneumonia, but the findings may simply be due to effusion associated atelectasis. Electronically Signed   By: Nelson Chimes M.D.   On: 02/20/2018 16:13   Ct Head Wo Contrast  Result Date: 02/19/2018 CLINICAL DATA:  Vertigo.  Weakness. EXAM: CT HEAD WITHOUT CONTRAST TECHNIQUE: Contiguous axial images were obtained from the base of the skull through the vertex without intravenous contrast. COMPARISON:  None. FINDINGS: Brain: No subdural, epidural, or subarachnoid hemorrhage. No mass effect or midline shift. Ventricles and sulci are prominent consistent volume loss. Cerebellum, brainstem, and basal cisterns are normal. Scattered white matter changes are noted. No acute cortical ischemia or infarct. Vascular: Calcified atherosclerosis in the intracranial carotids. Skull: Normal. Negative for fracture or  focal lesion. Sinuses/Orbits: No acute finding. Other: None. IMPRESSION: No acute intracranial abnormalities. No cause for syncope identified. Electronically Signed   By: Dorise Bullion III M.D   On: 02/26/2018 17:07     Subjective:   Discharge Exam: Vitals:   03/04/18 0310 03/04/18 0610  BP: (!) 174/81 (!) 158/67  Pulse: 71 71  Resp: 20   Temp: 98.7 F (37.1 C) 98 F (36.7 C)  SpO2: 97% 94%   Vitals:   03/03/18 2346 03/04/18 0001 03/04/18 0310 03/04/18 0610  BP: (!) 165/80 (!) 156/77 (!) 174/81 (!) 158/67  Pulse: 73 72 71 71  Resp: 19 19 20    Temp: 98.7 F (37.1 C) 98.6 F (37 C) 98.7 F (37.1 C) 98 F (36.7 C)  TempSrc: Oral Oral Oral Oral  SpO2: 99% 99% 97% 94%   Eyes: anicteric sclera ENMT: dry mucus membranes, dry mouth Neck: normal, supple Respiratory: moderately increased work of breathing, crackles in bilateral lung fields, no wheezes or rhonchi Cardiovascular: Regular rate and rhythm, 2 out of 6 systolic murmur heard across precordium Abdomen: Soft, nontender, no rebound or guarding Musculoskeletal: 3+ lower extremity edema Skin: no rashes over visible skin Neurologic: Grossly intact, mildly misaligned eyes  Psychiatric: Normal judgment and insight. Alert and oriented x 3. Normal mood.    The results of significant diagnostics from this hospitalization (including imaging, microbiology, ancillary and laboratory) are listed below for reference.     Microbiology: No results found for this or any previous visit (from the past 240 hour(s)).   Labs: BNP (last 3 results) Recent Labs    02/24/2018 1718 03/03/18 1428  BNP 388.8* 478.2*   Basic Metabolic Panel: Recent Labs  Lab 03/03/18 1428 03/04/18 0424  NA 138 140  K 3.6 3.8  CL 109 105  CO2 24 28  GLUCOSE 156* 96  BUN 21 19  CREATININE 0.84 0.64  CALCIUM 8.0* 8.2*  MG  --  2.0   Liver Function Tests: No results for input(s): AST, ALT, ALKPHOS, BILITOT, PROT, ALBUMIN in the last 168 hours. No  results for input(s): LIPASE, AMYLASE in the last 168 hours. No results for input(s): AMMONIA in the last 168 hours. CBC: Recent Labs  Lab 03/03/18 1428 03/04/18 0424  WBC 5.3 8.6  NEUTROABS 0.1* 0.3*  HGB 6.5* 8.9*  HCT 20.3* 27.6*  MCV 104.1* 98.9  PLT 49* 48*   Cardiac Enzymes: Recent Labs  Lab 03/03/18 1428  TROPONINI 0.03*   BNP: Invalid input(s): POCBNP CBG: No results for input(s): GLUCAP in the last 168 hours. D-Dimer No results for input(s): DDIMER in the last 72 hours. Hgb A1c No results for input(s): HGBA1C in the last 72 hours. Lipid Profile No results for input(s): CHOL, HDL, LDLCALC, TRIG, CHOLHDL, LDLDIRECT in the last 72 hours. Thyroid function studies No results for input(s): TSH, T4TOTAL, T3FREE, THYROIDAB in the last 72 hours.  Invalid input(s): FREET3 Anemia work up Recent Labs    03/03/18 1921  RETICCTPCT 1.5   Urinalysis    Component Value Date/Time   COLORURINE YELLOW 01/31/2018 1754   APPEARANCEUR CLOUDY (A) 02/06/2018 1754   LABSPEC 1.013 02/05/2018 1754   PHURINE 5.0 02/09/2018 1754   GLUCOSEU NEGATIVE 02/05/2018 1754   GLUCOSEU NEGATIVE 03/31/2017 1000   HGBUR LARGE (A) 02/05/2018 1754   BILIRUBINUR NEGATIVE 02/09/2018 1754   KETONESUR 5 (A) 02/15/2018 1754   PROTEINUR NEGATIVE 02/27/2018 1754   UROBILINOGEN 0.2 03/31/2017 1000   NITRITE NEGATIVE 02/22/2018 1754   LEUKOCYTESUR NEGATIVE 02/15/2018 1754   Sepsis Labs Invalid input(s): PROCALCITONIN,  WBC,  LACTICIDVEN Microbiology No results found for this or any previous visit (from the past 240 hour(s)).   Time coordinating discharge: 35  SIGNED:   Cristy Folks, MD  [facility] 03/04/2018, 10:10 AM  If 7PM-7AM, please contact night-coverage [email or web address] Password [email or web address]

## 2018-03-04 NOTE — Discharge Instructions (Signed)
Hospice Introduction Hospice is a service that is designed to provide people who are terminally ill and their families with medical, spiritual, and psychological support. Its aim is to improve your quality of life by keeping you as alert and comfortable as possible. Who will be my providers when I begin hospice care? Hospice teams often include:  A nurse.  A doctor. The hospice doctor will be available for your care, but you can bring your regular doctor or nurse practitioner.  Social workers.  Religious leaders (such as a Clinical biochemist).  Trained volunteers.  What roles will providers play in my care? Hospice is performed by a team of health care professionals and volunteers who:  Help keep you comfortable: ? Hospice can be provided in your home or in a homelike setting. ? The hospice staff works with your family and friends to help meet your needs. ? You will enjoy the support of loved ones by receiving much of your basic care from family and friends.  Provide pain relief and manage your symptoms. The staff supply all necessary medicines and equipment.  Provide companionship when you are alone.  Allow you and your family to rest. They may do light housekeeping, prepare meals, and run errands.  Provide counseling. They will make sure your emotional, spiritual, and social needs and those of your family are being met.  Provide spiritual care: ? Spiritual care will be individualized to meet your needs and your family's needs. ? Spiritual care may involve:  Helping you look at what death means to you.  Helping you say goodbye to your family and friends.  Performing a specific religious ceremony or ritual.  When should hospice care begin? Most people who use hospice are believed to have fewer than 6 months to live.  Your family and health care providers can help you decide when hospice services should begin.  If your condition improves, you may discontinue the program.  What  should I consider before selecting a program? Most hospice programs are run by nonprofit, independent organizations. Some are affiliated with hospitals, nursing homes, or home health care agencies. Hospice programs can take place in the home or at a hospice center, hospital, or skilled nursing facility. When choosing a hospice program, ask the following questions:  What services are available to me?  What services will be offered to my loved ones?  How involved will my loved ones be?  How involved will my health care provider be?  Who makes up the hospice care team? How are they trained or screened?  How will my pain and symptoms be managed?  If my circumstances change, can the services be provided in a different setting, such as my home or in the hospital?  Is the program reviewed and licensed by the state or certified in some other way?  Where can I learn more about hospice? You can learn about existing hospice programs in your area from your health care providers. You can also read more about hospice online. The websites of the following organizations contain helpful information:  The [facility] and Palliative Care Organization [facility]).  The Hospice Association of America ([place]).  The [place].  The American Cancer Society (ACS).  Hospice Net.  This information is not intended to replace advice given to you by your health care provider. Make sure you discuss any questions you have with your health care provider. Document Released: 08/06/2003 Document Revised: 12/04/2015 Document Reviewed: 02/27/2013 Elsevier Interactive Patient Education  2017 [employer].

## 2018-03-06 ENCOUNTER — Telehealth: Payer: Self-pay | Admitting: *Deleted

## 2018-03-06 LAB — PATHOLOGIST SMEAR REVIEW

## 2018-03-06 NOTE — Telephone Encounter (Signed)
Pt was on TCM report admitted 03/03/18 for Symptom medic anemia: Patient was admitted with hemoglobin of approximately 6.5, and with pulmonary infiltrates, lower extremity edema.  Suspect likely high-output heart failure due to his anemia.   He was given 2 packed units of red blood cells with diuresis which is outlined below.  Recheck hemoglobin was 8.9. Pt was D/c to SNF. Per summary will need to see PCp 1*-2 weeks after discharge from SNF.Marland KitchenJohny Lane

## 2018-03-07 ENCOUNTER — Other Ambulatory Visit: Payer: Self-pay

## 2018-03-07 ENCOUNTER — Encounter (HOSPITAL_COMMUNITY): Payer: Self-pay

## 2018-03-07 ENCOUNTER — Inpatient Hospital Stay (HOSPITAL_COMMUNITY)
Admission: EM | Admit: 2018-03-07 | Discharge: 2018-03-09 | DRG: 871 | Disposition: A | Discharge status: Skilled Nursing Facility | Payer: Medicare Other | Attending: Internal Medicine | Admitting: Internal Medicine

## 2018-03-07 ENCOUNTER — Emergency Department (HOSPITAL_COMMUNITY): Payer: Medicare Other

## 2018-03-07 DIAGNOSIS — R4182 Altered mental status, unspecified: Secondary | ICD-10-CM | POA: Diagnosis not present

## 2018-03-07 DIAGNOSIS — Z515 Encounter for palliative care: Secondary | ICD-10-CM | POA: Diagnosis present

## 2018-03-07 DIAGNOSIS — Z79899 Other long term (current) drug therapy: Secondary | ICD-10-CM | POA: Diagnosis not present

## 2018-03-07 DIAGNOSIS — E860 Dehydration: Secondary | ICD-10-CM | POA: Diagnosis present

## 2018-03-07 DIAGNOSIS — I10 Essential (primary) hypertension: Secondary | ICD-10-CM | POA: Diagnosis present

## 2018-03-07 DIAGNOSIS — Z7189 Other specified counseling: Secondary | ICD-10-CM | POA: Diagnosis not present

## 2018-03-07 DIAGNOSIS — G629 Polyneuropathy, unspecified: Secondary | ICD-10-CM | POA: Diagnosis present

## 2018-03-07 DIAGNOSIS — J9601 Acute respiratory failure with hypoxia: Secondary | ICD-10-CM | POA: Diagnosis present

## 2018-03-07 DIAGNOSIS — R0989 Other specified symptoms and signs involving the circulatory and respiratory systems: Secondary | ICD-10-CM

## 2018-03-07 DIAGNOSIS — I5032 Chronic diastolic (congestive) heart failure: Secondary | ICD-10-CM | POA: Diagnosis present

## 2018-03-07 DIAGNOSIS — Z7401 Bed confinement status: Secondary | ICD-10-CM | POA: Diagnosis not present

## 2018-03-07 DIAGNOSIS — K5900 Constipation, unspecified: Secondary | ICD-10-CM | POA: Diagnosis present

## 2018-03-07 DIAGNOSIS — I110 Hypertensive heart disease with heart failure: Secondary | ICD-10-CM | POA: Diagnosis present

## 2018-03-07 DIAGNOSIS — Z66 Do not resuscitate: Secondary | ICD-10-CM | POA: Diagnosis present

## 2018-03-07 DIAGNOSIS — Z87891 Personal history of nicotine dependence: Secondary | ICD-10-CM

## 2018-03-07 DIAGNOSIS — N401 Enlarged prostate with lower urinary tract symptoms: Secondary | ICD-10-CM | POA: Diagnosis present

## 2018-03-07 DIAGNOSIS — D696 Thrombocytopenia, unspecified: Secondary | ICD-10-CM | POA: Diagnosis not present

## 2018-03-07 DIAGNOSIS — R4020 Unspecified coma: Secondary | ICD-10-CM | POA: Diagnosis not present

## 2018-03-07 DIAGNOSIS — R410 Disorientation, unspecified: Secondary | ICD-10-CM | POA: Diagnosis not present

## 2018-03-07 DIAGNOSIS — R351 Nocturia: Secondary | ICD-10-CM | POA: Diagnosis present

## 2018-03-07 DIAGNOSIS — R6520 Severe sepsis without septic shock: Secondary | ICD-10-CM

## 2018-03-07 DIAGNOSIS — R404 Transient alteration of awareness: Secondary | ICD-10-CM | POA: Diagnosis not present

## 2018-03-07 DIAGNOSIS — M2550 Pain in unspecified joint: Secondary | ICD-10-CM | POA: Diagnosis not present

## 2018-03-07 DIAGNOSIS — L89152 Pressure ulcer of sacral region, stage 2: Secondary | ICD-10-CM | POA: Diagnosis present

## 2018-03-07 DIAGNOSIS — E785 Hyperlipidemia, unspecified: Secondary | ICD-10-CM | POA: Diagnosis present

## 2018-03-07 DIAGNOSIS — A419 Sepsis, unspecified organism: Secondary | ICD-10-CM | POA: Diagnosis present

## 2018-03-07 DIAGNOSIS — J45909 Unspecified asthma, uncomplicated: Secondary | ICD-10-CM | POA: Diagnosis present

## 2018-03-07 DIAGNOSIS — R0689 Other abnormalities of breathing: Secondary | ICD-10-CM | POA: Diagnosis not present

## 2018-03-07 DIAGNOSIS — N179 Acute kidney failure, unspecified: Secondary | ICD-10-CM | POA: Diagnosis present

## 2018-03-07 DIAGNOSIS — R899 Unspecified abnormal finding in specimens from other organs, systems and tissues: Secondary | ICD-10-CM | POA: Diagnosis not present

## 2018-03-07 DIAGNOSIS — J15212 Pneumonia due to Methicillin resistant Staphylococcus aureus: Secondary | ICD-10-CM | POA: Diagnosis present

## 2018-03-07 DIAGNOSIS — J189 Pneumonia, unspecified organism: Secondary | ICD-10-CM | POA: Diagnosis not present

## 2018-03-07 DIAGNOSIS — C9500 Acute leukemia of unspecified cell type not having achieved remission: Secondary | ICD-10-CM | POA: Diagnosis present

## 2018-03-07 DIAGNOSIS — Z85820 Personal history of malignant melanoma of skin: Secondary | ICD-10-CM | POA: Diagnosis not present

## 2018-03-07 DIAGNOSIS — D649 Anemia, unspecified: Secondary | ICD-10-CM | POA: Diagnosis not present

## 2018-03-07 DIAGNOSIS — I499 Cardiac arrhythmia, unspecified: Secondary | ICD-10-CM | POA: Diagnosis not present

## 2018-03-07 DIAGNOSIS — E876 Hypokalemia: Secondary | ICD-10-CM | POA: Diagnosis present

## 2018-03-07 DIAGNOSIS — D61818 Other pancytopenia: Secondary | ICD-10-CM | POA: Diagnosis present

## 2018-03-07 DIAGNOSIS — L8990 Pressure ulcer of unspecified site, unspecified stage: Secondary | ICD-10-CM

## 2018-03-07 DIAGNOSIS — J811 Chronic pulmonary edema: Secondary | ICD-10-CM | POA: Diagnosis not present

## 2018-03-07 LAB — CBC WITH DIFFERENTIAL/PLATELET
BASOS ABS: 0 10*3/uL (ref 0.0–0.1)
BASOS PCT: 0 %
BLASTS: 69 %
Band Neutrophils: 2 %
EOS ABS: 0 10*3/uL (ref 0.0–0.5)
EOS PCT: 0 %
HCT: 26.8 % — ABNORMAL LOW (ref 39.0–52.0)
Hemoglobin: 8.7 g/dL — ABNORMAL LOW (ref 13.0–17.0)
LYMPHS PCT: 13 %
Lymphs Abs: 1 10*3/uL (ref 0.7–4.0)
MCH: 32.1 pg (ref 26.0–34.0)
MCHC: 32.5 g/dL (ref 30.0–36.0)
MCV: 98.9 fL (ref 80.0–100.0)
MONO ABS: 0.3 10*3/uL (ref 0.1–1.0)
Metamyelocytes Relative: 1 %
Monocytes Relative: 4 %
Myelocytes: 2 %
NEUTROS PCT: 9 %
NRBC: 0.5 % — AB (ref 0.0–0.2)
Neutro Abs: 1.1 10*3/uL — ABNORMAL LOW (ref 1.7–7.7)
PLATELETS: 53 10*3/uL — AB (ref 150–400)
RBC: 2.71 MIL/uL — ABNORMAL LOW (ref 4.22–5.81)
RDW: 20.2 % — ABNORMAL HIGH (ref 11.5–15.5)
WBC: 7.9 10*3/uL (ref 4.0–10.5)

## 2018-03-07 LAB — URINALYSIS, ROUTINE W REFLEX MICROSCOPIC
Bilirubin Urine: NEGATIVE
Glucose, UA: NEGATIVE mg/dL
Ketones, ur: NEGATIVE mg/dL
LEUKOCYTES UA: NEGATIVE
NITRITE: NEGATIVE
PROTEIN: 30 mg/dL — AB
Specific Gravity, Urine: 1.013 (ref 1.005–1.030)
pH: 6 (ref 5.0–8.0)

## 2018-03-07 LAB — COMPREHENSIVE METABOLIC PANEL
ALBUMIN: 3 g/dL — AB (ref 3.5–5.0)
ALK PHOS: 61 U/L (ref 38–126)
ALT: 23 U/L (ref 0–44)
ANION GAP: 12 (ref 5–15)
AST: 46 U/L — ABNORMAL HIGH (ref 15–41)
BILIRUBIN TOTAL: 1.6 mg/dL — AB (ref 0.3–1.2)
BUN: 29 mg/dL — ABNORMAL HIGH (ref 8–23)
CALCIUM: 7.9 mg/dL — AB (ref 8.9–10.3)
CO2: 27 mmol/L (ref 22–32)
Chloride: 99 mmol/L (ref 98–111)
Creatinine, Ser: 1.19 mg/dL (ref 0.61–1.24)
GFR, EST NON AFRICAN AMERICAN: 53 mL/min — AB (ref >60)
Glucose, Bld: 140 mg/dL — ABNORMAL HIGH (ref 70–99)
POTASSIUM: 3.1 mmol/L — AB (ref 3.5–5.1)
Sodium: 138 mmol/L (ref 135–145)
TOTAL PROTEIN: 5.5 g/dL — AB (ref 6.5–8.1)

## 2018-03-07 LAB — I-STAT ARTERIAL BLOOD GAS, ED
Acid-Base Excess: 4 mmol/L — ABNORMAL HIGH (ref 0.0–2.0)
Bicarbonate: 26.9 mmol/L (ref 20.0–28.0)
O2 Saturation: 88 %
PCO2 ART: 35.7 mmHg (ref 32.0–48.0)
PO2 ART: 57 mmHg — AB (ref 83.0–108.0)
Patient temperature: 103.7
TCO2: 28 mmol/L (ref 22–32)
pH, Arterial: 7.496 — ABNORMAL HIGH (ref 7.350–7.450)

## 2018-03-07 LAB — PROTIME-INR
INR: 1.49
PROTHROMBIN TIME: 17.8 s — AB (ref 11.4–15.2)

## 2018-03-07 LAB — MRSA PCR SCREENING: MRSA by PCR: POSITIVE — AB

## 2018-03-07 LAB — I-STAT CG4 LACTIC ACID, ED: LACTIC ACID, VENOUS: 1.42 mmol/L (ref 0.5–1.9)

## 2018-03-07 MED ORDER — ACETAMINOPHEN 650 MG RE SUPP
650.0000 mg | Freq: Once | RECTAL | Status: AC
  Administered 2018-03-07: 650 mg via RECTAL
  Filled 2018-03-07: qty 1

## 2018-03-07 MED ORDER — SODIUM CHLORIDE 0.9 % IV SOLN
2.0000 g | Freq: Once | INTRAVENOUS | Status: AC
  Administered 2018-03-07: 2 g via INTRAVENOUS
  Filled 2018-03-07: qty 2

## 2018-03-07 MED ORDER — GABAPENTIN 100 MG PO CAPS
100.0000 mg | ORAL_CAPSULE | Freq: Every day | ORAL | Status: DC
  Administered 2018-03-08: 100 mg via ORAL
  Filled 2018-03-07 (×2): qty 1

## 2018-03-07 MED ORDER — ACETAMINOPHEN 325 MG PO TABS: 650.0000 mg | ORAL_TABLET | Freq: Four times a day (QID) | ORAL | Status: DC | PRN

## 2018-03-07 MED ORDER — VANCOMYCIN HCL IN DEXTROSE 1-5 GM/200ML-% IV SOLN
1000.0000 mg | Freq: Once | INTRAVENOUS | Status: AC
  Administered 2018-03-07: 1000 mg via INTRAVENOUS
  Filled 2018-03-07: qty 200

## 2018-03-07 MED ORDER — SODIUM CHLORIDE 0.9 % IV SOLN
1.0000 g | INTRAVENOUS | Status: DC
  Administered 2018-03-08: 1 g via INTRAVENOUS
  Filled 2018-03-07: qty 1

## 2018-03-07 MED ORDER — LACTATED RINGERS IV BOLUS (SEPSIS)
1000.0000 mL | Freq: Once | INTRAVENOUS | Status: AC
  Administered 2018-03-07: 1000 mL via INTRAVENOUS

## 2018-03-07 MED ORDER — VANCOMYCIN HCL 500 MG IV SOLR
500.0000 mg | INTRAVENOUS | Status: DC
  Administered 2018-03-08: 500 mg via INTRAVENOUS
  Filled 2018-03-07: qty 500

## 2018-03-07 MED ORDER — TAMSULOSIN HCL 0.4 MG PO CAPS
0.4000 mg | ORAL_CAPSULE | Freq: Every day | ORAL | Status: DC
  Administered 2018-03-08 – 2018-03-09 (×2): 0.4 mg via ORAL
  Filled 2018-03-07 (×3): qty 1

## 2018-03-07 MED ORDER — POTASSIUM CHLORIDE 20 MEQ/15ML (10%) PO SOLN
40.0000 meq | Freq: Once | ORAL | Status: DC
  Filled 2018-03-07: qty 30

## 2018-03-07 MED ORDER — LACTATED RINGERS IV BOLUS (SEPSIS): 500.0000 mL | Freq: Once | INTRAVENOUS | Status: DC

## 2018-03-07 MED ORDER — ACETAMINOPHEN 650 MG RE SUPP: 650.0000 mg | Freq: Four times a day (QID) | RECTAL | Status: DC | PRN

## 2018-03-07 MED ORDER — LACTATED RINGERS IV BOLUS (SEPSIS): 250.0000 mL | Freq: Once | INTRAVENOUS | Status: DC

## 2018-03-07 NOTE — ED Triage Notes (Signed)
Per [facility], pt coming from [facility] facility due to increasing altered level of consciousness over the past 48 hours. Pt normally alert and oriented x4, is currently alert and oriented x2 per EMS. Pt has end stage leukemia. EMS denies pt receiving chemo, but unsure current treatment. Pt is DNR with paperwork at bedside.

## 2018-03-07 NOTE — ED Notes (Signed)
ED Provider at bedside. 

## 2018-03-07 NOTE — Progress Notes (Signed)
Pharmacy Antibiotic Note  Jason Lane is a 84 y.o. male admitted on 03/07/2018 with sepsis secondary to pneumonia . Pharmacy has been consulted for cefepime and vancomycin dosing.  Currently, patient is febrile (Tmax 103.7) and WBC 7.9. Scr is elevated at 1.19 mg/dL (BL ~0.8). Estimated CrCl ~29.4 mL/min based on weight 57kg.    Plan: Cefepime 2 gm IV once per MD, then 1 gm IV q24h Vancomycin 1 gm IV once, then 500 mg q24h Monitor renal function and vancomycin trough at steady-state F/u cultures and length of therapy      Temp (24hrs), Avg:103.7 F (39.8 C), Min:103.7 F (39.8 C), Max:103.7 F (39.8 C)  Recent Labs  Lab 03/03/18 1428 03/04/18 0424 03/07/18 1617  WBC 5.3 8.6  --   CREATININE 0.84 0.64  --   LATICACIDVEN  --   --  1.42    CrCl cannot be calculated (Unknown ideal weight.).    No Known Allergies  Antimicrobials this admission: Cefepime 11/5>> Vancomycin 11/5>>   Microbiology results: 11/5 BCx:  Thank you for allowing pharmacy to be a part of this patient's care.  Willia Craze, Pharmacy Student

## 2018-03-07 NOTE — ED Notes (Signed)
X-ray at bedside

## 2018-03-07 NOTE — ED Provider Notes (Signed)
[facility] EMERGENCY DEPARTMENT Provider Note   CSN: [id number] Arrival date & time: 03/07/18  1528     History   Chief Complaint Chief Complaint  Patient presents with  . Altered Mental Status    HPI Jason Lane is a 84 y.o. male.  HPI Patient comes from nursing home with chief complaint of altered mental status over the past 48 hours.  Patient has history of leukemia, hypertension, chf hyperlipidemia and was recently admitted for pancytopenia, with symptomatic anemia and CHF exacerbation.  Patient has no complaints besides stating that he does not feel well.  He admits to cough without any phlegm.  Patient also complains of shortness of breath.  According to the EMS report, nursing home mentioned that patient has been altered over the last 48 hours.  Patient does not have any family and is not in touch with his ex-wife.  They reported that patient started having a low-grade fever yesterday.   Past Medical History:  Diagnosis Date  . ANEMIA-IRON DEFICIENCY 07/08/2010  . ASTHMA 12/03/2009   States no history to asthma  . BENIGN PROSTATIC HYPERTROPHY 12/03/2009  . Cancer ([facility])    skin cancer  . DIVERTICULOSIS, COLON 12/03/2009  . FATIGUE 12/03/2009  . GERD 12/03/2009  . HYPERLIPIDEMIA 12/03/2009  . HYPERTENSION 02/01/2010  . HYPERTENSION NEC 12/03/2009  . MALIGNANT MELANOMA, HX OF 12/03/2009  . SKIN LESION 12/03/2009  . TOBACCO USE, QUIT 12/03/2009  . TREMOR 12/03/2009    Patient Active Problem List   Diagnosis Date Noted  . Symptomatic anemia 03/03/2018  . Acute leukemia ([place]) 02/22/2018  . Palliative care by specialist   . Goals of care, counseling/discussion   . Pancytopenia ([place]) 02/15/2018  . Severe anemia 02/22/2018  . Rhabdomyolysis 02/03/2018  . Pulmonary edema 02/14/2018  . Elevated troponin mild 02/25/2018  . Gait disorder 08/01/2017  . BPPV (benign paroxysmal positional vertigo) 08/01/2017  . Gross hematuria 03/30/2017  . BRBPR (bright red blood  per rectum) 03/30/2017  . Rotator cuff tear arthropathy of both shoulders 11/30/2016  . Hyperglycemia 06/29/2016  . S/P laparoscopic hernia repair 06/24/2015  . Inguinal hernia, left 05/27/2015  . Skin lesion 05/27/2015  . Bradycardia 12/24/2014  . Chest pain 06/23/2012  . Peripheral neuropathy 12/19/2011  . Hematochezia 12/15/2011  . Leg cramps 12/15/2011  . Bladder neck obstruction 12/15/2011  . Vertigo 09/11/2011  . Preventative health care 09/09/2010  . ANEMIA-IRON DEFICIENCY 07/08/2010  . Essential hypertension 02/01/2010  . Hyperlipidemia 12/03/2009  . Asthma 12/03/2009  . GERD 12/03/2009  . DIVERTICULOSIS, COLON 12/03/2009  . BPH associated with nocturia 12/03/2009  . FATIGUE 12/03/2009  . TREMOR 12/03/2009  . MALIGNANT MELANOMA, HX OF 12/03/2009  . TOBACCO USE, QUIT 12/03/2009    Past Surgical History:  Procedure Laterality Date  . COLONOSCOPY    . HERNIA REPAIR    . INGUINAL HERNIA REPAIR Left 06/24/2015   Procedure: LAPAROSCOPIC REPAIR RECURRENT LEFT INGUINAL HERNIA;  Surgeon: Greer Pickerel, MD;  Location: [facility];  Service: General;  Laterality: Left;  . inguinal herniorrhapy  1970   right  . INSERTION OF MESH Left 06/24/2015   Procedure: INSERTION OF MESH;  Surgeon: Greer Pickerel, MD;  Location: [facility];  Service: General;  Laterality: Left;  . s/p left leg melanoma          Home Medications    Prior to Admission medications   Medication Sig Start Date End Date Taking? Authorizing Provider  co-enzyme Q-10 30 MG capsule Take 30 mg  by mouth 3 (three) times daily.    [provider]  furosemide (LASIX) 20 MG tablet Take 1 tablet (20 mg total) by mouth daily. 03/04/18 03/04/19  Purohit, Konrad Dolores, MD  gabapentin (NEURONTIN) 100 MG capsule Take 1 capsule (100 mg total) by mouth at bedtime. 02/22/18   Charlynne Cousins, MD  meclizine (ANTIVERT) 12.5 MG tablet Take 1 tablet (12.5 mg total) by mouth 3 (three) times daily as needed for dizziness. 08/15/17 08/15/18   Biagio Borg, MD  Multiple Vitamin (MULTIVITAMIN) capsule Take 1 capsule by mouth daily.      [provider]  tamsulosin (FLOMAX) 0.4 MG CAPS capsule TAKE 1 CAPSULE BY MOUTH EVERY DAY Patient taking differently: Take 0.4 mg by mouth every evening.  06/06/17   Biagio Borg, MD    Family History Family History  Problem Relation Age of Onset  . Colon cancer Father     Social History Social History   Tobacco Use  . Smoking status: Former Research scientist (life sciences)  . Smokeless tobacco: Never Used  . Tobacco comment: none since approx 1985  Substance Use Topics  . Alcohol use: Yes    Comment: occ beer   . Drug use: No     Allergies   Patient has no known allergies.   Review of Systems Review of Systems  Unable to perform ROS: Mental status change     Physical Exam Updated Vital Signs BP (!) 153/65   Pulse 99   Temp (!) 100.4 F (38 C) (Oral)   Resp (!) 22   Ht 5\' 8"  (1.727 m)   Wt 57 kg   SpO2 96%   BMI 19.11 kg/m   Physical Exam  Constitutional: He appears well-developed.  HENT:  Head: Atraumatic.  Eyes: EOM are normal.  Neck: Neck supple.  Cardiovascular: Normal rate.  Pulmonary/Chest: Effort normal. No respiratory distress.  Mild tachypnea  Abdominal: Soft.  Musculoskeletal: He exhibits no edema or tenderness.  Neurological:  Confused, but directable and able to tell me his name and location.  Skin: Skin is warm.  Nursing note and vitals reviewed.    ED Treatments / Results  Labs (all labs ordered are listed, but only abnormal results are displayed) Labs Reviewed  COMPREHENSIVE METABOLIC PANEL - Abnormal; Notable for the following components:      Result Value   Potassium 3.1 (*)    Glucose, Bld 140 (*)    BUN 29 (*)    Calcium 7.9 (*)    Total Protein 5.5 (*)    Albumin 3.0 (*)    AST 46 (*)    Total Bilirubin 1.6 (*)    GFR calc non Af Amer 53 (*)    All other components within normal limits  CBC WITH DIFFERENTIAL/PLATELET - Abnormal; Notable  for the following components:   RBC 2.71 (*)    Hemoglobin 8.7 (*)    HCT 26.8 (*)    RDW 20.2 (*)    Platelets 53 (*)    nRBC 0.5 (*)    Neutro Abs 1.1 (*)    All other components within normal limits  PROTIME-INR - Abnormal; Notable for the following components:   Prothrombin Time 17.8 (*)    All other components within normal limits  I-STAT ARTERIAL BLOOD GAS, ED - Abnormal; Notable for the following components:   pH, Arterial 7.496 (*)    pO2, Arterial 57.0 (*)    Acid-Base Excess 4.0 (*)    All other components within normal limits  CULTURE, BLOOD (ROUTINE X 2)  CULTURE, BLOOD (ROUTINE X 2)  URINALYSIS, ROUTINE W REFLEX MICROSCOPIC  BLOOD GAS, ARTERIAL  I-STAT CG4 LACTIC ACID, ED  I-STAT CG4 LACTIC ACID, ED    EKG EKG Interpretation  Date/Time:  Tuesday March 07 2018 18:00:03 EST Ventricular Rate:  89 PR Interval:    QRS Duration: 95 QT Interval:  388 QTC Calculation: 473 R Axis:   64 Text Interpretation:  Sinus rhythm Atrial premature complex Anteroseptal infarct, age indeterminate No acute changes Confirmed by Varney Biles [phone]) on 03/07/2018 6:41:24 PM   Radiology Dg Chest Portable 1 View  Result Date: 03/07/2018 CLINICAL DATA:  Suspected infection EXAM: PORTABLE CHEST 1 VIEW COMPARISON:  March 03, 2018 FINDINGS: The heart size and mediastinal contours are stable. There is pulmonary edema. Consolidation of right lung base is noted. There is no pleural effusion. The visualized skeletal structures are unremarkable. IMPRESSION: Right lung base pneumonia. Pulmonary edema. Electronically Signed   By: Abelardo Diesel M.D.   On: 03/07/2018 16:26    Procedures .Critical Care Performed by: Varney Biles, MD Authorized by: Varney Biles, MD   Critical care provider statement:    Critical care time (minutes):  45   Critical care start time:  03/07/2018 4:51 PM   Critical care end time:  03/07/2018 6:51 PM   Critical care was necessary to treat or prevent  imminent or life-threatening deterioration of the following conditions:  Respiratory failure and sepsis   Critical care was time spent personally by me on the following activities:  Discussions with consultants, evaluation of patient's response to treatment, examination of patient, ordering and performing treatments and interventions, ordering and review of laboratory studies, ordering and review of radiographic studies, pulse oximetry, re-evaluation of patient's condition, obtaining history from patient or surrogate and review of old charts   (including critical care time)    Medications Ordered in ED Medications  ceFEPIme (MAXIPIME) 1 g in sodium chloride 0.9 % 100 mL IVPB (has no administration in time range)  vancomycin (VANCOCIN) 500 mg in sodium chloride 0.9 % 100 mL IVPB (has no administration in time range)  vancomycin (VANCOCIN) IVPB 1000 mg/200 mL premix (1,000 mg Intravenous New Bag/Given 03/07/18 1721)  ceFEPIme (MAXIPIME) 2 g in sodium chloride 0.9 % 100 mL IVPB (0 g Intravenous Stopped 03/07/18 1800)  lactated ringers bolus 1,000 mL (0 mLs Intravenous Stopped 03/07/18 1825)  acetaminophen (TYLENOL) suppository 650 mg (650 mg Rectal Given 03/07/18 1722)     Initial Impression / Assessment and Plan / ED Course  I have reviewed the triage vital signs and the nursing notes.  Pertinent labs & imaging results that were available during my care of the patient were reviewed by me and considered in my medical decision making (see chart for details).  Clinical Course as of Mar 08 1843  Tue Mar 07, 2018  1842 Sepsis reassessment completed. Patient's white count is normal.  ABG shows hypoxia despite being on oxygen -we will place him on nonrebreather.  Heart rate is in the 90s.  Temperature is come down to 100.4. Patient's CODE STATUS is DNR.  We will admit him to medicine team.   [AN]    Clinical Course User Index [AN] Varney Biles, MD    84 year old male with history of CHF and  leukemia not on any chemotherapy comes in with chief complaint of altered mental status.  Patient has had multiple admissions within the last few months for pancytopenia, and it is last day he required  blood transfusion.  Patient is noted to be febrile.  He is confused, but directable and follows simple commands.  There is no nuchal rigidity appreciated and patient is moving all 4 extremities without any focal neuro deficits.  Patient has no complaints, he is noted to be slightly tachypneic and admits to having cough upon asking.  Chest x-ray shows right-sided infiltrate. Given the hypoxia, tachypnea and the cough with the x-ray finding, we think patient has pneumonia and will start hap coverage.  We will give 1 L of normal saline bolus right now.  Patient has mild pulmonary edema and is known to have CHF.  Initial lactic acid along with the blood pressure is fine.   CODE STATUS is DNR.  There is no family who can help further.  I will consult palliative care.  For now patient has been placed on nonrebreather.  Final Clinical Impressions(s) / ED Diagnoses   Final diagnoses:  Acute respiratory failure with hypoxia ([place])  Severe sepsis [facility])    ED Discharge Orders    None       Varney Biles, MD 03/07/18 1853

## 2018-03-07 NOTE — ED Notes (Signed)
Safety sitter at bedside 

## 2018-03-07 NOTE — H&P (Signed)
History and Physical    Jason Lane TZG:[id number] DOB: 12-03-1927 DOA: 03/07/2018  PCP: Biagio Borg, MD  Patient coming from: [facility] facility  I have personally briefly reviewed patient's old medical records in [place]  Chief Complaint: Dyspnea and altered mental status  HPI: Jason Lane is a 84 y.o. male with medical history significant for leukemia with pancytopenia, hypertension, hyperlipidemia, CHF, and BPH who was sent to the ED by his nursing facility for increasing confusion over the last 48 hours and fever.  Patient is disoriented but is able to answer some yes/no questions.  His main complaint is trouble with breathing.  He reports a dry cough.  He denies chest pain, palpitations, abdominal pain.  He says he is having both diarrhea and constipation on and off.  He reports difficulty with urination.  He was recently admitted at [facility] long hospital from 03/03/2018 to 03/04/18 for symptomatic anemia.  He was transfused 2 units of packed RBCs.  Hemoglobin on discharge was 8.9.  He was seen by palliative care discharged to his nursing facility with hospice care.  Patient states he has no family or person of contact to offer assistance.  ED Course:  Initial vitals were BP 145/62, pulse 97, RR 24, temp 103.53F rectally, SPO2 99% on supplemental oxygen.  His oxygenation saturation decreased to mid 80s and he was placed on nonrebreather.  Lab work was notable for K 3.1, WBC 7.9, hemoglobin 8.7, platelets 53,000, lactic acid 1.42.  ABG on 2 L supplemental oxygen showed pH 7.496, PCO2 35.7, PO2 57.0.  Portable chest x-ray showed new consolidation at the right lung base and pulmonary edema.  Blood cultures were drawn and he was started on healthcare associated pneumonia antibiotic coverage with vancomycin and cefepime.  A consult to palliative care was also placed.  The hospitalist service was consulted to admit for pneumonia.  Review of Systems: As per HPI otherwise 10 point  review of systems negative.    Past Medical History:  Diagnosis Date  . ANEMIA-IRON DEFICIENCY 07/08/2010  . ASTHMA 12/03/2009   States no history to asthma  . BENIGN PROSTATIC HYPERTROPHY 12/03/2009  . Cancer ([place])    skin cancer  . DIVERTICULOSIS, COLON 12/03/2009  . FATIGUE 12/03/2009  . GERD 12/03/2009  . HYPERLIPIDEMIA 12/03/2009  . HYPERTENSION 02/01/2010  . HYPERTENSION NEC 12/03/2009  . MALIGNANT MELANOMA, HX OF 12/03/2009  . SKIN LESION 12/03/2009  . TOBACCO USE, QUIT 12/03/2009  . TREMOR 12/03/2009    Past Surgical History:  Procedure Laterality Date  . COLONOSCOPY    . HERNIA REPAIR    . INGUINAL HERNIA REPAIR Left 06/24/2015   Procedure: LAPAROSCOPIC REPAIR RECURRENT LEFT INGUINAL HERNIA;  Surgeon: Greer Pickerel, MD;  Location: [facility];  Service: General;  Laterality: Left;  . inguinal herniorrhapy  1970   right  . INSERTION OF MESH Left 06/24/2015   Procedure: INSERTION OF MESH;  Surgeon: Greer Pickerel, MD;  Location: [facility];  Service: General;  Laterality: Left;  . s/p left leg melanoma       reports that he has quit smoking. He has never used smokeless tobacco. He reports that he drinks alcohol. He reports that he does not use drugs.  No Known Allergies  Family History  Problem Relation Age of Onset  . Colon cancer Father      Prior to Admission medications   Medication Sig Start Date End Date Taking? Authorizing Provider  co-enzyme Q-10 30 MG capsule Take 30 mg by mouth  3 (three) times daily.    [provider]  furosemide (LASIX) 20 MG tablet Take 1 tablet (20 mg total) by mouth daily. 03/04/18 03/04/19  Purohit, Konrad Dolores, MD  gabapentin (NEURONTIN) 100 MG capsule Take 1 capsule (100 mg total) by mouth at bedtime. 02/22/18   Charlynne Cousins, MD  meclizine (ANTIVERT) 12.5 MG tablet Take 1 tablet (12.5 mg total) by mouth 3 (three) times daily as needed for dizziness. 08/15/17 08/15/18  Biagio Borg, MD  Multiple Vitamin (MULTIVITAMIN) capsule Take 1 capsule by mouth daily.       [provider]  tamsulosin (FLOMAX) 0.4 MG CAPS capsule TAKE 1 CAPSULE BY MOUTH EVERY DAY Patient taking differently: Take 0.4 mg by mouth every evening.  06/06/17   Biagio Borg, MD    Physical Exam: Vitals:   03/07/18 1915 03/07/18 1930 03/07/18 2010 03/07/18 2358  BP: (!) 159/69 (!) 167/73  (!) 174/80  Pulse: 67   (!) 102  Resp: (!) 25     Temp:   99.7 F (37.6 C) 98 F (36.7 C)  TempSrc:   Oral Oral  SpO2:  97%  99%  Weight:   61.2 kg   Height:   5\' 8"  (1.727 m)     Constitutional: Chronically ill-appearing elderly man Eyes: PERRL, right-sided ptosis ENMT: Mucous membranes are dry. Posterior pharynx clear of any exudate or lesions. Neck: normal, supple, no masses. Respiratory: Inspiratory rhonchi.  Mild increased respiratory effort. No accessory muscle use.  Cardiovascular: Tachycardic, irregularly irregular, no murmurs / rubs / gallops. No extremity edema.  Abdomen: no tenderness, no masses palpated. No hepatosplenomegaly. Bowel sounds positive.  Musculoskeletal: no clubbing / cyanosis. No joint deformity upper and lower extremities. Good ROM, no contractures. Normal muscle tone.  Skin: no rashes, lesions, ulcers. No induration Neurologic: Tremors of both hands. Sensation intact. Strength 5/5 in all 4.  Psychiatric: Alert and oriented to person but not place or year. Normal mood.     Labs on Admission: I have personally reviewed following labs and imaging studies  CBC: Recent Labs  Lab 03/03/18 1428 03/04/18 0424 03/07/18 1602  WBC 5.3 8.6 7.9  NEUTROABS 0.1* 0.3* 1.1*  HGB 6.5* 8.9* 8.7*  HCT 20.3* 27.6* 26.8*  MCV 104.1* 98.9 98.9  PLT 49* 48* 53*   Basic Metabolic Panel: Recent Labs  Lab 03/03/18 1428 03/04/18 0424 03/07/18 1602  NA 138 140 138  K 3.6 3.8 3.1*  CL 109 105 99  CO2 24 28 27   GLUCOSE 156* 96 140*  BUN 21 19 29*  CREATININE 0.84 0.64 1.19  CALCIUM 8.0* 8.2* 7.9*  MG  --  2.0  --    GFR: Estimated Creatinine Clearance:  37.1 mL/min (by C-G formula based on SCr of 1.19 mg/dL). Liver Function Tests: Recent Labs  Lab 03/07/18 1602  AST 46*  ALT 23  ALKPHOS 61  BILITOT 1.6*  PROT 5.5*  ALBUMIN 3.0*   No results for input(s): LIPASE, AMYLASE in the last 168 hours. No results for input(s): AMMONIA in the last 168 hours. Coagulation Profile: Recent Labs  Lab 03/07/18 1602  INR 1.49   Cardiac Enzymes: Recent Labs  Lab 03/03/18 1428  TROPONINI 0.03*   BNP (last 3 results) No results for input(s): PROBNP in the last 8760 hours. HbA1C: No results for input(s): HGBA1C in the last 72 hours. CBG: No results for input(s): GLUCAP in the last 168 hours. Lipid Profile: No results for input(s): CHOL, HDL, LDLCALC, TRIG, CHOLHDL,  LDLDIRECT in the last 72 hours. Thyroid Function Tests: No results for input(s): TSH, T4TOTAL, FREET4, T3FREE, THYROIDAB in the last 72 hours. Anemia Panel: No results for input(s): VITAMINB12, FOLATE, FERRITIN, TIBC, IRON, RETICCTPCT in the last 72 hours. Urine analysis:    Component Value Date/Time   COLORURINE YELLOW 03/07/2018 1757   APPEARANCEUR HAZY (A) 03/07/2018 1757   LABSPEC 1.013 03/07/2018 1757   PHURINE 6.0 03/07/2018 1757   GLUCOSEU NEGATIVE 03/07/2018 1757   GLUCOSEU NEGATIVE 03/31/2017 1000   HGBUR MODERATE (A) 03/07/2018 1757   BILIRUBINUR NEGATIVE 03/07/2018 1757   KETONESUR NEGATIVE 03/07/2018 1757   PROTEINUR 30 (A) 03/07/2018 1757   UROBILINOGEN 0.2 03/31/2017 1000   NITRITE NEGATIVE 03/07/2018 1757   LEUKOCYTESUR NEGATIVE 03/07/2018 1757    Radiological Exams on Admission: Dg Chest Portable 1 View  Result Date: 03/07/2018 CLINICAL DATA:  Suspected infection EXAM: PORTABLE CHEST 1 VIEW COMPARISON:  March 03, 2018 FINDINGS: The heart size and mediastinal contours are stable. There is pulmonary edema. Consolidation of right lung base is noted. There is no pleural effusion. The visualized skeletal structures are unremarkable. IMPRESSION: Right lung  base pneumonia. Pulmonary edema. Electronically Signed   By: Abelardo Diesel M.D.   On: 03/07/2018 16:26    EKG: Independently reviewed.  Sinus rhythm with PACs.  Assessment/Plan Principal Problem:   Acute respiratory failure with hypoxia (HCC) Active Problems:   Hyperlipidemia   Essential hypertension   BPH associated with nocturia   Pancytopenia (HCC)   Acute leukemia (HCC)   Chronic diastolic CHF (congestive heart failure) ([place])   Ethel Veronica is a 84 y.o. male with medical history significant for leukemia with pancytopenia, hypertension, hyperlipidemia, CHF, and BPH who was sent to the ED by his nursing facility for confusion and dyspnea.  He is admitted for acute respiratory failure with hypoxia secondary to pneumonia.  Acute respiratory failure with hypoxia secondary to right lower lung pneumonia: -Continue vancomycin and cefepime -Continue supplemental oxygen, nonrebreather as needed -Patient is DNR/DNI -High risk for further decompensation and respiratory failure -Can use BiPAP as needed, however if patient not responding well may need to move towards comfort care. -Patient has no family or other contact to guide further care.  Per my conversation with him, he says he is ready to die if he declines further.  Chronic diastolic CHF: Acute respiratory failure likely contributed by pulmonary edema.  We will give IV Lasix 40 mg once now.  As above can use BiPAP as needed.  Leukemia with pancytopenia: Recent diagnosis and he is not a candidate for chemotherapy.  Per documentation on last admission he is interested in hospice care and was discharged with outpatient hospice.  DVT prophylaxis: SCDs  Code Status: DNR/DNI  Family Communication: no family or contacts to notify per patient  Disposition Plan: Poor prognosis, potential in-hospital death  Consults called: Palliative care consulted by ED  Admission status: Inpatient    Zada Finders MD [facility] Pager  [phone]  If 7PM-7AM, please contact night-coverage [email or web address] Password [email or web address]  03/08/2018, 12:20 AM

## 2018-03-07 NOTE — ED Notes (Signed)
Pt 85% on 5L [place]. Pt switched to NRB per MD. Pt now 97%.

## 2018-03-08 DIAGNOSIS — N401 Enlarged prostate with lower urinary tract symptoms: Secondary | ICD-10-CM

## 2018-03-08 DIAGNOSIS — R6520 Severe sepsis without septic shock: Secondary | ICD-10-CM

## 2018-03-08 DIAGNOSIS — D649 Anemia, unspecified: Secondary | ICD-10-CM

## 2018-03-08 DIAGNOSIS — L8990 Pressure ulcer of unspecified site, unspecified stage: Secondary | ICD-10-CM

## 2018-03-08 DIAGNOSIS — I5032 Chronic diastolic (congestive) heart failure: Secondary | ICD-10-CM

## 2018-03-08 DIAGNOSIS — R351 Nocturia: Secondary | ICD-10-CM

## 2018-03-08 DIAGNOSIS — I10 Essential (primary) hypertension: Secondary | ICD-10-CM

## 2018-03-08 DIAGNOSIS — J9601 Acute respiratory failure with hypoxia: Secondary | ICD-10-CM

## 2018-03-08 DIAGNOSIS — D696 Thrombocytopenia, unspecified: Secondary | ICD-10-CM

## 2018-03-08 DIAGNOSIS — A419 Sepsis, unspecified organism: Principal | ICD-10-CM

## 2018-03-08 DIAGNOSIS — E860 Dehydration: Secondary | ICD-10-CM

## 2018-03-08 LAB — BASIC METABOLIC PANEL
Anion gap: 7 (ref 5–15)
Anion gap: 10 (ref 5–15)
BUN: 33 mg/dL — ABNORMAL HIGH (ref 8–23)
BUN: 41 mg/dL — ABNORMAL HIGH (ref 8–23)
CHLORIDE: 104 mmol/L (ref 98–111)
CHLORIDE: 101 mmol/L (ref 98–111)
CO2: 29 mmol/L (ref 22–32)
CO2: 27 mmol/L (ref 22–32)
Calcium: 7.6 mg/dL — ABNORMAL LOW (ref 8.9–10.3)
Calcium: 7.5 mg/dL — ABNORMAL LOW (ref 8.9–10.3)
Creatinine, Ser: 1.2 mg/dL (ref 0.61–1.24)
Creatinine, Ser: 1.44 mg/dL — ABNORMAL HIGH (ref 0.61–1.24)
GFR calc non Af Amer: 52 mL/min — ABNORMAL LOW (ref >60)
GFR, EST AFRICAN AMERICAN: 48 mL/min — AB (ref >60)
GFR, EST NON AFRICAN AMERICAN: 42 mL/min — AB (ref >60)
Glucose, Bld: 128 mg/dL — ABNORMAL HIGH (ref 70–99)
Glucose, Bld: 131 mg/dL — ABNORMAL HIGH (ref 70–99)
POTASSIUM: 2.9 mmol/L — AB (ref 3.5–5.1)
POTASSIUM: 3.4 mmol/L — AB (ref 3.5–5.1)
SODIUM: 138 mmol/L (ref 135–145)
Sodium: 140 mmol/L (ref 135–145)

## 2018-03-08 LAB — RESPIRATORY PANEL BY PCR
ADENOVIRUS-RVPPCR: NOT DETECTED
Bordetella pertussis: NOT DETECTED
CHLAMYDOPHILA PNEUMONIAE-RVPPCR: NOT DETECTED
CORONAVIRUS NL63-RVPPCR: NOT DETECTED
CORONAVIRUS OC43-RVPPCR: NOT DETECTED
Coronavirus 229E: NOT DETECTED
Coronavirus HKU1: NOT DETECTED
INFLUENZA A-RVPPCR: NOT DETECTED
Influenza B: NOT DETECTED
MYCOPLASMA PNEUMONIAE-RVPPCR: NOT DETECTED
Metapneumovirus: NOT DETECTED
PARAINFLUENZA VIRUS 1-RVPPCR: NOT DETECTED
PARAINFLUENZA VIRUS 3-RVPPCR: NOT DETECTED
PARAINFLUENZA VIRUS 4-RVPPCR: NOT DETECTED
Parainfluenza Virus 2: NOT DETECTED
RHINOVIRUS / ENTEROVIRUS - RVPPCR: NOT DETECTED
Respiratory Syncytial Virus: NOT DETECTED

## 2018-03-08 LAB — STREP PNEUMONIAE URINARY ANTIGEN: STREP PNEUMO URINARY ANTIGEN: NEGATIVE

## 2018-03-08 LAB — CBC
HCT: 23.4 % — ABNORMAL LOW (ref 39.0–52.0)
HEMOGLOBIN: 7.5 g/dL — AB (ref 13.0–17.0)
MCH: 31.5 pg (ref 26.0–34.0)
MCHC: 32.1 g/dL (ref 30.0–36.0)
MCV: 98.3 fL (ref 80.0–100.0)
NRBC: 1.2 % — AB (ref 0.0–0.2)
Platelets: 45 10*3/uL — ABNORMAL LOW (ref 150–400)
RBC: 2.38 MIL/uL — ABNORMAL LOW (ref 4.22–5.81)
RDW: 20.1 % — ABNORMAL HIGH (ref 11.5–15.5)
WBC: 4.9 10*3/uL (ref 4.0–10.5)

## 2018-03-08 LAB — INFLUENZA PANEL BY PCR (TYPE A & B)
INFLBPCR: NEGATIVE
Influenza A By PCR: NEGATIVE

## 2018-03-08 LAB — MAGNESIUM: MAGNESIUM: 2.1 mg/dL (ref 1.7–2.4)

## 2018-03-08 LAB — PATHOLOGIST SMEAR REVIEW

## 2018-03-08 MED ORDER — FUROSEMIDE 10 MG/ML IJ SOLN: 40.0000 mg | Freq: Once | INTRAMUSCULAR | Status: DC

## 2018-03-08 MED ORDER — HALOPERIDOL 0.5 MG PO TABS
0.5000 mg | ORAL_TABLET | Freq: Four times a day (QID) | ORAL | Status: DC | PRN
  Administered 2018-03-08: 0.5 mg via ORAL
  Filled 2018-03-08: qty 1

## 2018-03-08 MED ORDER — POTASSIUM CHLORIDE 10 MEQ/100ML IV SOLN
10.0000 meq | INTRAVENOUS | Status: AC
  Administered 2018-03-08 (×5): 10 meq via INTRAVENOUS
  Filled 2018-03-08 (×5): qty 100

## 2018-03-08 MED ORDER — SODIUM CHLORIDE 0.9 % IV SOLN
INTRAVENOUS | Status: DC
  Administered 2018-03-08 – 2018-03-09 (×2): via INTRAVENOUS

## 2018-03-08 MED ORDER — POTASSIUM CHLORIDE CRYS ER 20 MEQ PO TBCR
40.0000 meq | EXTENDED_RELEASE_TABLET | Freq: Once | ORAL | Status: AC
  Administered 2018-03-08: 40 meq via ORAL
  Filled 2018-03-08: qty 2

## 2018-03-08 MED ORDER — FUROSEMIDE 10 MG/ML IJ SOLN
INTRAMUSCULAR | Status: AC
  Administered 2018-03-08: 40 mg via INTRAVENOUS
  Filled 2018-03-08: qty 4

## 2018-03-08 MED ORDER — FUROSEMIDE 10 MG/ML IJ SOLN
40.0000 mg | Freq: Once | INTRAMUSCULAR | Status: AC
  Administered 2018-03-08: 40 mg via INTRAVENOUS

## 2018-03-08 MED ORDER — HALOPERIDOL LACTATE 5 MG/ML IJ SOLN
0.5000 mg | Freq: Four times a day (QID) | INTRAMUSCULAR | Status: DC | PRN
  Administered 2018-03-08 – 2018-03-09 (×2): 0.5 mg via INTRAVENOUS
  Filled 2018-03-08 (×2): qty 1

## 2018-03-08 MED ORDER — POTASSIUM CHLORIDE CRYS ER 20 MEQ PO TBCR: 40.0000 meq | EXTENDED_RELEASE_TABLET | ORAL | Status: DC

## 2018-03-08 MED ORDER — FUROSEMIDE 10 MG/ML IJ SOLN: 20.0000 mg | Freq: Once | INTRAMUSCULAR | Status: DC

## 2018-03-08 NOTE — Progress Notes (Addendum)
PROGRESS NOTE    Jason Lane   NID:[id number]  DOB: 1927-12-29  DOA: 03/07/2018 PCP: Biagio Borg, MD   Brief Narrative:  Jason Lane is a 84 y.o. male with medical history significant for leukemia with pancytopenia, hypertension, hyperlipidemia, CHF, and BPH who was sent to the ED by his nursing facility for increasing confusion over the last 48 hours and fever.  Patient is disoriented but is able to answer some yes/no questions.   - main complaint- dyspnea, dry cough- diarrhea and constipation on and off-  reports difficulty with urination.  He was recently admitted at [facility] hospital from 03/03/2018 to 03/04/18 for symptomatic anemia & high outpt CHF.  He was transfused 2 units of packed RBCs.  Hemoglobin on discharge was 8.9.  He was seen by palliative care discharged to his nursing facility with hospice care.  Patient states he has no family or person of contact to offer assistance.  In ED> temp 103.7,  SPO2 99% on supplemental oxygen.  His oxygenation saturation decreased to mid 80s, RR ^- he was placed on nonrebreather. CXR: new consolidation at the right lung base and pulmonary edema. - given vancomycin and cefepime, started on BiPAP -  consult to palliative care   - DNR/ DNI  Subjective: Feels cough is not as severe. No appetite. No dyspnea at rest    Assessment & Plan:   Principal Problem:   Acute respiratory failure with hypoxia  / RLL pneumonia Sepsis - weaned off of Bipap to 3 L - temp 100.6 today - MRSA PCR + - RLL infiltrate on CXR today - Influenza neg - resp panel pending - ? MRSA pneumonia or aspiration - cont Vanc and Cefepime  Active Problems:  Hypokalemia - K 2.9, Mg normal- replace K- recheck later today - addendum K 3.4- give one more dose of 40 meq  AKI / Chronic diastolic CHF (congestive heart failure) - mod LVH on ECHO 10/19- normal EF - baseline Cr 0.6- Cr on admission 1.19  - takes oral Lasix 20 mg at home daily - Lasix 40 mg IV  given yesterday as CXR suggested pulme edema however on exam he appears dehydrated- start slow IVF     BPH associated with nocturia - Flomax     Acute leukemia with anemia and thrombocytopenia - not a candidate for chemo - Hb slightly low today at 7.5 - Platelets at baseline ~ 40-50 - WBC stable  Peripheral neuropathy - Gabapentin    DVT prophylaxis: SCDS Code Status: DNR Family Communication:   Disposition Plan: start diet- cont to follow in SDU for 24 more hours Consultants:   none Procedures:   none Antimicrobials:  Anti-infectives (From admission, onward)   Start     Dose/Rate Route Frequency Ordered Stop   03/08/18 1730  vancomycin (VANCOCIN) 500 mg in sodium chloride 0.9 % 100 mL IVPB     500 mg 100 mL/hr over 60 Minutes Intravenous Every 24 hours 03/07/18 1809     03/08/18 1700  ceFEPIme (MAXIPIME) 1 g in sodium chloride 0.9 % 100 mL IVPB     1 g 200 mL/hr over 30 Minutes Intravenous Every 24 hours 03/07/18 1809     03/07/18 1700  vancomycin (VANCOCIN) IVPB 1000 mg/200 mL premix     1,000 mg 200 mL/hr over 60 Minutes Intravenous  Once 03/07/18 1647 03/07/18 1942   03/07/18 1700  ceFEPIme (MAXIPIME) 2 g in sodium chloride 0.9 % 100 mL IVPB     2 g  200 mL/hr over 30 Minutes Intravenous  Once 03/07/18 1647 03/07/18 1800       Objective: Vitals:   03/08/18 0327 03/08/18 0344 03/08/18 0656 03/08/18 0708  BP: (!) 151/92   128/63  Pulse: 95 90 85 83  Resp: 20 16 19  (!) 29  Temp:    (!) 100.6 F (38.1 C)  TempSrc:    Oral  SpO2: 100% 100% 99% 92%  Weight:      Height:        Intake/Output Summary (Last 24 hours) at 03/08/2018 1048 Last data filed at 03/08/2018 0440 Gross per 24 hour  Intake 1300 ml  Output 325 ml  Net 975 ml   Filed Weights   03/07/18 1802 03/07/18 2010  Weight: 57 kg 61.2 kg    Examination: General exam: Appears comfortable - very fatigued with whispering voice HEENT: PERRLA,   no sclera icterus or thrush- mouth is severely  dry Respiratory system: Clear to auscultation listened anteriorly. Respiratory effort normal. Cardiovascular system: S1 & S2 heard, RRR.   Gastrointestinal system: Abdomen scaphoid, soft, non-tender, nondistended. Normal bowel sounds.  Central nervous system: Alert and oriented to person and place Extremities: No cyanosis, clubbing or edema Skin: No rashes or ulcers      Data Reviewed: I have personally reviewed following labs and imaging studies  CBC: Recent Labs  Lab 03/03/18 1428 03/04/18 0424 03/07/18 1602 03/08/18 0233  WBC 5.3 8.6 7.9 4.9  NEUTROABS 0.1* 0.3* 1.1*  --   HGB 6.5* 8.9* 8.7* 7.5*  HCT 20.3* 27.6* 26.8* 23.4*  MCV 104.1* 98.9 98.9 98.3  PLT 49* 48* 53* 45*   Basic Metabolic Panel: Recent Labs  Lab 03/03/18 1428 03/04/18 0424 03/07/18 1602 03/08/18 0233  NA 138 140 138 140  K 3.6 3.8 3.1* 2.9*  CL 109 105 99 104  CO2 24 28 27 29   GLUCOSE 156* 96 140* 128*  BUN 21 19 29* 33*  CREATININE 0.84 0.64 1.19 1.20  CALCIUM 8.0* 8.2* 7.9* 7.6*  MG  --  2.0  --   --    GFR: Estimated Creatinine Clearance: 36.8 mL/min (by C-G formula based on SCr of 1.2 mg/dL). Liver Function Tests: Recent Labs  Lab 03/07/18 1602  AST 46*  ALT 23  ALKPHOS 61  BILITOT 1.6*  PROT 5.5*  ALBUMIN 3.0*   No results for input(s): LIPASE, AMYLASE in the last 168 hours. No results for input(s): AMMONIA in the last 168 hours. Coagulation Profile: Recent Labs  Lab 03/07/18 1602  INR 1.49   Cardiac Enzymes: Recent Labs  Lab 03/03/18 1428  TROPONINI 0.03*   BNP (last 3 results) No results for input(s): PROBNP in the last 8760 hours. HbA1C: No results for input(s): HGBA1C in the last 72 hours. CBG: No results for input(s): GLUCAP in the last 168 hours. Lipid Profile: No results for input(s): CHOL, HDL, LDLCALC, TRIG, CHOLHDL, LDLDIRECT in the last 72 hours. Thyroid Function Tests: No results for input(s): TSH, T4TOTAL, FREET4, T3FREE, THYROIDAB in the last 72  hours. Anemia Panel: No results for input(s): VITAMINB12, FOLATE, FERRITIN, TIBC, IRON, RETICCTPCT in the last 72 hours. Urine analysis:    Component Value Date/Time   COLORURINE YELLOW 03/07/2018 1757   APPEARANCEUR HAZY (A) 03/07/2018 1757   LABSPEC 1.013 03/07/2018 1757   PHURINE 6.0 03/07/2018 1757   GLUCOSEU NEGATIVE 03/07/2018 1757   GLUCOSEU NEGATIVE 03/31/2017 1000   HGBUR MODERATE (A) 03/07/2018 1757   BILIRUBINUR NEGATIVE 03/07/2018 1757   KETONESUR NEGATIVE  03/07/2018 1757   PROTEINUR 30 (A) 03/07/2018 1757   UROBILINOGEN 0.2 03/31/2017 1000   NITRITE NEGATIVE 03/07/2018 1757   LEUKOCYTESUR NEGATIVE 03/07/2018 1757   Sepsis Labs: @LABRCNTIP (procalcitonin:4,lacticidven:4) ) Recent Results (from the past 240 hour(s))  Culture, blood (Routine x 2)     Status: None (Preliminary result)   Collection Time: 03/07/18  3:55 PM  Result Value Ref Range Status   Specimen Description BLOOD RIGHT ANTECUBITAL  Final   Special Requests   Final    BOTTLES DRAWN AEROBIC AND ANAEROBIC Blood Culture adequate volume   Culture   Final    NO GROWTH < 24 HOURS Performed at [facility], [place] [address] [place], [place] [address]    Report Status PENDING  Incomplete  Culture, blood (Routine x 2)     Status: None (Preliminary result)   Collection Time: 03/07/18  4:08 PM  Result Value Ref Range Status   Specimen Description SITE NOT SPECIFIED  Final   Special Requests   Final    BOTTLES DRAWN AEROBIC AND ANAEROBIC Blood Culture results may not be optimal due to an excessive volume of blood received in culture bottles   Culture   Final    NO GROWTH < 24 HOURS Performed at [facility] [address] [place], [place] [address]    Report Status PENDING  Incomplete  MRSA PCR Screening     Status: Abnormal   Collection Time: 03/07/18  8:02 PM  Result Value Ref Range Status   MRSA by PCR POSITIVE (A) NEGATIVE Final    Comment:        The GeneXpert MRSA Assay (FDA approved  for NASAL specimens only), is one component of a comprehensive MRSA colonization surveillance program. It is not intended to diagnose MRSA infection nor to guide or monitor treatment for MRSA infections. RESULT CALLED TO, READ BACK BY AND VERIFIED WITH: RN G PASCULL 03/07/18 AT 2159 BY CM Performed at [facility], [place] [address] [place], [place] [address]          Radiology Studies: Dg Chest Portable 1 View  Result Date: 03/07/2018 CLINICAL DATA:  Suspected infection EXAM: PORTABLE CHEST 1 VIEW COMPARISON:  March 03, 2018 FINDINGS: The heart size and mediastinal contours are stable. There is pulmonary edema. Consolidation of right lung base is noted. There is no pleural effusion. The visualized skeletal structures are unremarkable. IMPRESSION: Right lung base pneumonia. Pulmonary edema. Electronically Signed   By: Abelardo Diesel M.D.   On: 03/07/2018 16:26      Scheduled Meds: . gabapentin  100 mg Oral QHS  . potassium chloride  40 mEq Oral Once  . tamsulosin  0.4 mg Oral Daily   Continuous Infusions: . ceFEPime (MAXIPIME) IV    . potassium chloride 10 mEq (03/08/18 0947)  . vancomycin       LOS: 1 day    Time spent in minutes: 40- prior chart reveiwed    Debbe Odea, MD [facility] Pager: [email or web address] Password [email or web address] 03/08/2018, 10:48 AM

## 2018-03-08 NOTE — Evaluation (Signed)
Clinical/Bedside Swallow Evaluation Patient Details  Name: Jason Lane MRN: [id number] Date of Birth: [birth year]  Today's Date: 03/08/2018 Time: SLP Start Time (ACUTE ONLY): 1400 SLP Stop Time (ACUTE ONLY): 1435 SLP Time Calculation (min) (ACUTE ONLY): 35 min  Past Medical History:  Past Medical History:  Diagnosis Date  . ANEMIA-IRON DEFICIENCY 07/08/2010  . ASTHMA 12/03/2009   States no history to asthma  . BENIGN PROSTATIC HYPERTROPHY 12/03/2009  . Cancer ([place])    skin cancer  . DIVERTICULOSIS, COLON 12/03/2009  . FATIGUE 12/03/2009  . GERD 12/03/2009  . HYPERLIPIDEMIA 12/03/2009  . HYPERTENSION 02/01/2010  . HYPERTENSION NEC 12/03/2009  . MALIGNANT MELANOMA, HX OF 12/03/2009  . SKIN LESION 12/03/2009  . TOBACCO USE, QUIT 12/03/2009  . TREMOR 12/03/2009   Past Surgical History:  Past Surgical History:  Procedure Laterality Date  . COLONOSCOPY    . HERNIA REPAIR    . INGUINAL HERNIA REPAIR Left 06/24/2015   Procedure: LAPAROSCOPIC REPAIR RECURRENT LEFT INGUINAL HERNIA;  Surgeon: Greer Pickerel, MD;  Location: [facility];  Service: General;  Laterality: Left;  . inguinal herniorrhapy  1970   right  . INSERTION OF MESH Left 06/24/2015   Procedure: INSERTION OF MESH;  Surgeon: Greer Pickerel, MD;  Location: [facility];  Service: General;  Laterality: Left;  . s/p left leg melanoma     HPI:  Jason Lane is a 84 y.o. male with medical history significant for leukemia with pancytopenia, hypertension, hyperlipidemia, CHF, and BPH who was sent to the ED by his nursing facility for increasing confusion over the last 48 hours and fever.  Patient is disoriented but is able to answer some yes/no questions.  His main complaint is trouble with breathing.    Assessment / Plan / Recommendation Clinical Impression  Patient admitted with trouble breathing and is currently on 6L [place] with a wet cough.  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 He reports continued nausea, but  willing to take a few ice chips, sips of water and applesauce. He appeared to have good oral control and transfer with all consistencies, but had delayed coughing with all consistencies. Patient's chest x-ray was positive for right lower lobe pneumonia. Recommend objective swallow evaluation (MBS) to further assess swallowing abilities and recommend a safe diet.  SLP Visit Diagnosis: Dysphagia, unspecified (R13.10)    Aspiration Risk  Moderate aspiration risk    Diet Recommendation NPO   Medication Administration: Via alternative means    Other  Recommendations Oral Care Recommendations: Oral care QID   Follow up Recommendations Skilled Nursing facility      Frequency and Duration   TBD         Prognosis Prognosis for Safe Diet Advancement: Fair Barriers to Reach Goals: Severity of deficits      Swallow Study   General Date of Onset: 03/07/18 HPI: Jason Lane is a 83 y.o. male with medical history significant for leukemia with pancytopenia, hypertension, hyperlipidemia, CHF, and BPH who was sent to the ED by his nursing facility for increasing confusion over the last 48 hours and fever.  Patient is disoriented but is able to answer some yes/no questions.  His main complaint is trouble with breathing.  Type of Study: Bedside Swallow Evaluation Previous Swallow Assessment: no Diet Prior to this Study: NPO Temperature Spikes Noted: Yes Respiratory Status: Nasal cannula History of Recent Intubation: No Behavior/Cognition: Alert;Cooperative Oral Cavity Assessment: Dried secretions Oral Care Completed by SLP: Yes Oral Cavity -  Dentition: Edentulous Vision: Functional for self-feeding Self-Feeding Abilities: Needs assist Patient Positioning: Upright in bed Baseline Vocal Quality: Wet;Breathy Volitional Cough: Weak Volitional Swallow: Able to elicit    Oral/Motor/Sensory Function Overall Oral Motor/Sensory Function: Mild impairment Facial ROM: Within Functional Limits Facial  Symmetry: Within Functional Limits Facial Strength: Within Functional Limits Facial Sensation: Within Functional Limits Lingual ROM: Reduced right;Reduced left Lingual Symmetry: Within Functional Limits Lingual Strength: Reduced Lingual Sensation: Within Functional Limits   Ice Chips Ice chips: Impaired Presentation: Spoon Pharyngeal Phase Impairments: Cough - Delayed;Suspected delayed Swallow   Thin Liquid Thin Liquid: Impaired Presentation: Cup;Spoon Pharyngeal  Phase Impairments: Suspected delayed Swallow;Throat Clearing - Delayed    Nectar Thick Nectar Thick Liquid: Not tested Other Comments: (patient refused further PO due to nausea)   Honey Thick Honey Thick Liquid: Not tested   Puree Puree: Impaired Presentation: Spoon Pharyngeal Phase Impairments: Suspected delayed Swallow;Throat Clearing - Delayed   Solid     Solid: Not tested     Charlynne Cousins Tamara [place], MA, CCC-SLP 03/08/2018 2:39 PM

## 2018-03-08 NOTE — Plan of Care (Signed)
PMT note:  Consult noted. Attempted to visit with patient. Staff working with patient. Will reattempt goals of care meeting in the morning.

## 2018-03-08 NOTE — Progress Notes (Signed)
NT notified RN that patient started to become restless. Patient started to display WOB, agitated, and had coarse crackles. Pt O2 was down at 77 on 6L [place]. Patient then placed on Non-rebreather  And O2 came back up at 98%. RT called and MD Posey Pronto, V notified at 0000.   RT came and placed patient on Bipap.   MD Posey Pronto gave orders to give patient 40 MG of Lasix and to continue to place patient on Bipap.   Will continue to monitor and assess patient.

## 2018-03-09 ENCOUNTER — Other Ambulatory Visit: Payer: Self-pay

## 2018-03-09 ENCOUNTER — Inpatient Hospital Stay (HOSPITAL_COMMUNITY): Payer: Medicare Other

## 2018-03-09 ENCOUNTER — Encounter (HOSPITAL_COMMUNITY): Payer: Self-pay

## 2018-03-09 DIAGNOSIS — C9500 Acute leukemia of unspecified cell type not having achieved remission: Secondary | ICD-10-CM

## 2018-03-09 DIAGNOSIS — Z515 Encounter for palliative care: Secondary | ICD-10-CM

## 2018-03-09 DIAGNOSIS — Z7189 Other specified counseling: Secondary | ICD-10-CM

## 2018-03-09 DIAGNOSIS — D61818 Other pancytopenia: Secondary | ICD-10-CM

## 2018-03-09 DIAGNOSIS — E785 Hyperlipidemia, unspecified: Secondary | ICD-10-CM

## 2018-03-09 LAB — BASIC METABOLIC PANEL
Anion gap: 8 (ref 5–15)
BUN: 37 mg/dL — AB (ref 8–23)
CALCIUM: 7.8 mg/dL — AB (ref 8.9–10.3)
CO2: 26 mmol/L (ref 22–32)
Chloride: 107 mmol/L (ref 98–111)
Creatinine, Ser: 1.23 mg/dL (ref 0.61–1.24)
GFR calc Af Amer: 59 mL/min — ABNORMAL LOW (ref >60)
GFR, EST NON AFRICAN AMERICAN: 51 mL/min — AB (ref >60)
GLUCOSE: 123 mg/dL — AB (ref 70–99)
Potassium: 4.3 mmol/L (ref 3.5–5.1)
Sodium: 141 mmol/L (ref 135–145)

## 2018-03-09 LAB — CBC
HEMATOCRIT: 25.6 % — AB (ref 39.0–52.0)
Hemoglobin: 7.8 g/dL — ABNORMAL LOW (ref 13.0–17.0)
MCH: 30.6 pg (ref 26.0–34.0)
MCHC: 30.5 g/dL (ref 30.0–36.0)
MCV: 100.4 fL — AB (ref 80.0–100.0)
NRBC: 0.5 % — AB (ref 0.0–0.2)
PLATELETS: 45 10*3/uL — AB (ref 150–400)
RBC: 2.55 MIL/uL — ABNORMAL LOW (ref 4.22–5.81)
RDW: 20.4 % — AB (ref 11.5–15.5)
WBC: 4.2 10*3/uL (ref 4.0–10.5)

## 2018-03-09 MED ORDER — FUROSEMIDE 10 MG/ML IJ SOLN
40.0000 mg | Freq: Once | INTRAMUSCULAR | Status: AC
  Administered 2018-03-09: 40 mg via INTRAVENOUS

## 2018-03-09 MED ORDER — ACETAMINOPHEN 650 MG RE SUPP: 650.0000 mg | Freq: Four times a day (QID) | RECTAL | 0 refills | Status: AC | PRN

## 2018-03-09 MED ORDER — METOPROLOL TARTRATE 5 MG/5ML IV SOLN
2.5000 mg | Freq: Once | INTRAVENOUS | Status: AC
  Administered 2018-03-09: 2.5 mg via INTRAVENOUS
  Filled 2018-03-09: qty 5

## 2018-03-09 MED ORDER — DIGOXIN 0.25 MG/ML IJ SOLN
0.5000 mg | Freq: Once | INTRAMUSCULAR | Status: AC
  Administered 2018-03-09: 0.5 mg via INTRAVENOUS
  Filled 2018-03-09: qty 2

## 2018-03-09 MED ORDER — ACETAMINOPHEN 325 MG PO TABS: 650.0000 mg | ORAL_TABLET | Freq: Four times a day (QID) | ORAL | Status: AC | PRN

## 2018-03-09 MED ORDER — FUROSEMIDE 10 MG/ML IJ SOLN
INTRAMUSCULAR | Status: AC
  Administered 2018-03-09: 40 mg via INTRAVENOUS
  Filled 2018-03-09: qty 4

## 2018-03-09 NOTE — Clinical Social Work Placement (Addendum)
   CLINICAL SOCIAL WORK PLACEMENT  NOTE  Date:  03/09/2018  Patient Details  Name: Jason Lane MRN: [id number] Date of Birth: 21-Dec-1927  Clinical Social Work is seeking post-discharge placement for this patient at the [place] level of care (*CSW will initial, date and re-position this form in  chart as items are completed):      Patient/family provided with [place] Work Department's list of facilities offering this level of care within the geographic area requested by the patient (or if unable, by the patient's family).  Yes   Patient/family informed of their freedom to choose among providers that offer the needed level of care, that participate in Medicare, Medicaid or managed care program needed by the patient, have an available bed and are willing to accept the patient.      Patient/family informed of [facility]'s ownership interest in [facility] and [facility], as well as of the fact that they are under no obligation to receive care at these facilities.  PASRR submitted to EDS on       PASRR number received on       Existing PASRR number confirmed on       FL2 transmitted to all facilities in geographic area requested by pt/family on       FL2 transmitted to all facilities within larger geographic area on       Patient informed that his/her managed care company has contracts with or will negotiate with certain facilities, including the following:            Patient/family informed of bed offers received.  Patient chooses bed at ([facility])     Physician recommends and patient chooses bed at      Patient to be transferred to ([facility]) on 03/09/18.  Patient to be transferred to facility by [facility]     Patient family notified on 03/09/18 of transfer.  Name of family member notified:    PHYSICIAN       Additional Comment:    _______________________________________________ Eileen Stanford, LCSW 03/09/2018, 12:59 PM

## 2018-03-09 NOTE — Clinical Social Work Note (Signed)
Clinical Social Worker facilitated patient discharge including contacting patient family and facility to confirm patient discharge plans.  Clinical information faxed to facility and family agreeable with plan.  CSW arranged ambulance transport via [facility] to [facility] .  RN to call [phone] for report prior to discharge.  Clinical Social Worker will sign off for now as social work intervention is no longer needed. Please consult [place] again if new need arises.  [place], [place]

## 2018-03-09 NOTE — Consult Note (Addendum)
   [facility] CM Inpatient Consult   03/09/2018  Luian Schumpert 1928-04-08 [id number]   Patient screened for potential [facility] Care Management services due to unplanned readmission risk score of 23% (high) and multiple hospitalizations.  Chart reviewed. Patient recently readmitted from SNF with palliative to follow. Spoke with inpatient RNCM. Palliative medicine team has been following during this hospitalization. Likely disposition to return to SNF with palliative follow up. No identifiable [facility] Care Management needs at this time.   Marthenia Rolling, MSN-Ed, RN,BSN [facility] Liaison [phone]

## 2018-03-09 NOTE — Progress Notes (Signed)
..  PTAR given all discharge paper work. All pages reviewed and subsequent questions answered. Patients telemetry and peripheral IV's discontinued without complications. Patients belongings returned to [facility] for transit. Report called to Reggie RN at [facility].   English as a second language teacher

## 2018-03-09 NOTE — NC FL2 (Signed)
[facility] LEVEL OF CARE SCREENING TOOL     IDENTIFICATION  Patient Name: Jason Lane Birthdate: Jul 21, 1927 Sex: male Admission Date (Current Location): 03/07/2018  [facility] and [place] Number:  Herbalist and Address:  [facility], [place] [address] [place], [place] [address]      Provider Number: [id number]  Attending Physician Name and Address:  Debbe Odea, MD  Relative Name and Phone Number:       Current Level of Care: Hospital Recommended Level of Care: [place] Prior Approval Number:    Date Approved/Denied:   PASRR Number:    Discharge Plan: SNF    Current Diagnoses: Patient Active Problem List   Diagnosis Date Noted  . Chronic diastolic CHF (congestive heart failure) ([place]) 03/08/2018  . Pressure injury of skin 03/08/2018  . Acute respiratory failure with hypoxia ([place]) 03/07/2018  . Symptomatic anemia 03/03/2018  . Acute leukemia ([place]) 02/22/2018  . Palliative care by specialist   . Goals of care, counseling/discussion   . Pancytopenia ([place]) 02/15/2018  . Severe anemia 02/08/2018  . Rhabdomyolysis 02/06/2018  . Pulmonary edema 02/04/2018  . Elevated troponin mild 02/20/2018  . Gait disorder 08/01/2017  . BPPV (benign paroxysmal positional vertigo) 08/01/2017  . Gross hematuria 03/30/2017  . BRBPR (bright red blood per rectum) 03/30/2017  . Rotator cuff tear arthropathy of both shoulders 11/30/2016  . Hyperglycemia 06/29/2016  . S/P laparoscopic hernia repair 06/24/2015  . Inguinal hernia, left 05/27/2015  . Skin lesion 05/27/2015  . Bradycardia 12/24/2014  . Chest pain 06/23/2012  . Peripheral neuropathy 12/19/2011  . Hematochezia 12/15/2011  . Leg cramps 12/15/2011  . Bladder neck obstruction 12/15/2011  . Vertigo 09/11/2011  . Preventative health care 09/09/2010  . ANEMIA-IRON DEFICIENCY 07/08/2010  . Essential hypertension 02/01/2010  . Hyperlipidemia 12/03/2009  . Asthma  12/03/2009  . GERD 12/03/2009  . DIVERTICULOSIS, COLON 12/03/2009  . BPH associated with nocturia 12/03/2009  . FATIGUE 12/03/2009  . TREMOR 12/03/2009  . MALIGNANT MELANOMA, HX OF 12/03/2009  . TOBACCO USE, QUIT 12/03/2009    Orientation RESPIRATION BLADDER Height & Weight     Self  O2(Nasal Cannula 6L) External catheter, Incontinent(placed 11/06) Weight: 134 lb 14.7 oz (61.2 kg) Height:  5\' 8"  (172.7 cm)  BEHAVIORAL SYMPTOMS/MOOD NEUROLOGICAL BOWEL NUTRITION STATUS      Incontinent Diet  AMBULATORY STATUS COMMUNICATION OF NEEDS Skin   Extensive Assist Verbally PU Stage and Appropriate Care( Open wound on arm)   PU Stage 2 Dressing: (Sacrum, foam dressing, change PRN)                   Personal Care Assistance Level of Assistance  Bathing, Feeding, Dressing Bathing Assistance: Maximum assistance Feeding assistance: Independent Dressing Assistance: Maximum assistance     Functional Limitations Info  Hearing, Speech, Sight Sight Info: Impaired Hearing Info: Impaired Speech Info: Adequate    SPECIAL CARE FACTORS FREQUENCY  PT (By licensed PT)                    Contractures Contractures Info: Not present    Additional Factors Info  Code Status, Allergies Code Status Info: DNR Allergies Info: No known allergies           Current Medications (03/09/2018):  This is the current hospital active medication list Current Facility-Administered Medications  Medication Dose Route Frequency Provider Last Rate Last Dose  . acetaminophen (TYLENOL) tablet 650 mg  650 mg Oral Q6H  PRN Lenore Cordia, MD       Or  . acetaminophen (TYLENOL) suppository 650 mg  650 mg Rectal Q6H PRN Zada Finders R, MD      . gabapentin (NEURONTIN) capsule 100 mg  100 mg Oral QHS Zada Finders R, MD   100 mg at 03/08/18 2210  . haloperidol (HALDOL) tablet 0.5 mg  0.5 mg Oral Q6H PRN Zada Finders R, MD   0.5 mg at 03/08/18 2210   Or  . haloperidol lactate (HALDOL) injection 0.5 mg  0.5  mg Intravenous Q6H PRN Zada Finders R, MD   0.5 mg at 03/09/18 0604  . tamsulosin (FLOMAX) capsule 0.4 mg  0.4 mg Oral Daily Zada Finders R, MD   0.4 mg at 03/09/18 0901     Discharge Medications: Please see discharge summary for a list of discharge medications.  Relevant Imaging Results:  Relevant Lab Results:   Additional Information SS# [id number]    Eileen Stanford, LCSW

## 2018-03-09 NOTE — Clinical Social Work Note (Signed)
Pt will return to [facility] full comfort care.   [place], [place]

## 2018-03-09 NOTE — Consult Note (Signed)
Consultation Note Date: 03/09/2018   Patient Name: Jason Lane  DOB: [birth year]  MRN: [id number]  Age / Sex: 84 y.o., male  PCP: Biagio Borg, MD Referring Physician: Debbe Odea, MD  Reason for Consultation: Establishing goals of care  HPI/Patient Profile: 84 y.o. male with multiple recent admissions for low HGB, weakness, hypoxic respiratory failure, and CHF. He has a recent diagnosis of leukemia. He is currently admitted for PNA.    Clinical Assessment and Goals of Care: Patient is resting in bed. He answers questions appropriately. He denies complaint. Sitter at bedside to be sure he does not get out of bed or remove his lines. She confirms he has had a good appetite eating most food provided.    He states he is from [place] He is a retired Chief Financial Officer. He states he was never married and never had children.    We discussed his diagnosis, prognosis, GOC, EOL wishes disposition and options.  A detailed discussion was had today regarding advanced directives.  Concepts specific to code status, artifical feeding and hydration, IV antibiotics and rehospitalization were discussed.  The difference between an aggressive medical intervention path and a comfort care path was discussed.  Values and goals of care important to patient and family were attempted to be elicited.  He confirms he is aware he has cancer. He states he does not want to be at the hospital and that he does not want to return. He states he is ready to go, and upon clarification states he is ready to die. He declines chaplain services. When asked if he wanted to continue his care for this current hospitalization or discharge now, he confirms he is ready to leave here at this time and be tucked in somewhere for what time he has left.   Patient is his Media planner. No spouse or children. He does not want anyone else contacted.    I  completed a MOST form today and was placed in the chart. A copy was placed in the chart to be scanned into EMR. The patient outlined their wishes for the following treatment decisions:  Cardiopulmonary Resuscitation: Do Not Attempt Resuscitation (DNR/No CPR)  Medical Interventions: Comfort Measures: Keep clean, warm, and dry. Use medication by any route, positioning, wound care, and other measures to relieve pain and suffering. Use oxygen, suction and manual treatment of airway obstruction as needed for comfort. Do not transfer to the hospital unless comfort needs cannot be met in current location.  Antibiotics: No antibiotics (use other measures to relieve symptoms)  IV Fluids: No IV fluids (provide other measures to ensure comfort)  Feeding Tube: No feeding tube      SUMMARY OF RECOMMENDATIONS   Return to facility with hospice to follow. He does not wish to return to the hospital and would like to be tucked in for what time he has left.    Code Status/Advance Care Planning:  DNR    Symptom Management:   No complaints currently.   Psycho-social/Spiritual:   Desire for  further Chaplaincy support:no   Prognosis:   < 6 months Leukemia. PNA. Low HGB.   Discharge Planning: [facility] with Hospice      Primary Diagnoses: Present on Admission: . Acute respiratory failure with hypoxia ([place]) . Essential hypertension . Hyperlipidemia . Pancytopenia ([place]) . BPH associated with nocturia . Acute leukemia ([place])   I have reviewed the medical record, interviewed the patient and family, and examined the patient. The following aspects are pertinent.  Past Medical History:  Diagnosis Date  . ANEMIA-IRON DEFICIENCY 07/08/2010  . ASTHMA 12/03/2009   States no history to asthma  . BENIGN PROSTATIC HYPERTROPHY 12/03/2009  . Cancer ([place])    skin cancer  . DIVERTICULOSIS, COLON 12/03/2009  . FATIGUE 12/03/2009  . GERD 12/03/2009  . HYPERLIPIDEMIA 12/03/2009  . HYPERTENSION  02/01/2010  . HYPERTENSION NEC 12/03/2009  . MALIGNANT MELANOMA, HX OF 12/03/2009  . SKIN LESION 12/03/2009  . TOBACCO USE, QUIT 12/03/2009  . TREMOR 12/03/2009   Social History   Socioeconomic History  . Marital status: Single    Spouse name: Not on file  . Number of children: Not on file  . Years of education: Not on file  . Highest education level: Not on file  Occupational History  . Occupation: lived in [place] as Chief Financial Officer  Social Needs  . Financial resource strain: Not on file  . Food insecurity:    Worry: Not on file    Inability: Not on file  . Transportation needs:    Medical: Not on file    Non-medical: Not on file  Tobacco Use  . Smoking status: Former Research scientist (life sciences)  . Smokeless tobacco: Never Used  . Tobacco comment: none since approx 1985  Substance and Sexual Activity  . Alcohol use: Yes    Comment: occ beer   . Drug use: No  . Sexual activity: Not on file  Lifestyle  . Physical activity:    Days per week: Not on file    Minutes per session: Not on file  . Stress: Not on file  Relationships  . Social connections:    Talks on phone: Not on file    Gets together: Not on file    Attends religious service: Not on file    Active member of club or organization: Not on file    Attends meetings of clubs or organizations: Not on file    Relationship status: Not on file  Other Topics Concern  . Not on file  Social History Narrative   Just moved to [place]   Originally from [place] in [place] in [employer] in [place] with gardening/has a living will   No children/never married   Family History  Problem Relation Age of Onset  . Colon cancer Father    Scheduled Meds: . gabapentin  100 mg Oral QHS  . tamsulosin  0.4 mg Oral Daily   Continuous Infusions: PRN Meds:.acetaminophen **OR** acetaminophen, haloperidol **OR** haloperidol lactate Medications Prior to Admission:  Prior to Admission medications   Medication Sig Start Date End Date Taking? Authorizing  Provider  furosemide (LASIX) 20 MG tablet Take 1 tablet (20 mg total) by mouth daily. 03/04/18 03/04/19 Yes Purohit, Konrad Dolores, MD  gabapentin (NEURONTIN) 100 MG capsule Take 1 capsule (100 mg total) by mouth at bedtime. 02/22/18  Yes Charlynne Cousins, MD  Multiple Vitamin (MULTIVITAMIN) capsule Take 1 capsule by mouth daily.     Yes [provider]  meclizine (ANTIVERT) 12.5 MG tablet  Take 1 tablet (12.5 mg total) by mouth 3 (three) times daily as needed for dizziness. Patient not taking: Reported on 03/07/2018 08/15/17 08/15/18  Biagio Borg, MD  tamsulosin (FLOMAX) 0.4 MG CAPS capsule TAKE 1 CAPSULE BY MOUTH EVERY DAY Patient not taking: No sig reported 06/06/17   Biagio Borg, MD   No Known Allergies Review of Systems  All other systems reviewed and are negative.   Physical Exam  Constitutional: No distress.  Pulmonary/Chest: Effort normal.  Neurological: He is alert.  Skin: Skin is warm and dry.    Vital Signs: BP (!) 146/75 (BP Location: Right Arm)   Pulse (!) 107   Temp 98.2 F (36.8 C) (Oral)   Resp 20   Ht _0  (1.727 m)   Wt 61.2 kg   SpO2 100%   BMI 20.51 kg/m  Pain Scale: 0-10   Pain Score: 0-No pain   SpO2: SpO2: 100 % O2 Device:SpO2: 100 % O2 Flow Rate: .O2 Flow Rate (L/min): 6 L/min  IO: Intake/output summary:   Intake/Output Summary (Last 24 hours) at 03/09/2018 1011 Last data filed at 03/09/2018 6840 Gross per 24 hour  Intake 3080.48 ml  Output 1175 ml  Net 1905.48 ml    LBM: Last BM Date: 03/09/18 Baseline Weight: Weight: 57 kg Most recent weight: Weight: 61.2 kg     Palliative Assessment/Data: 30%   Plan discussed with RN, SW, and primary MD.   Time In: 9:10 Time Out: 9:50 Time Total: 40 min Greater than 50%  of this time was spent counseling and coordinating care related to the above assessment and plan.  Signed by: Asencion Gowda, NP   Please contact Palliative Medicine Team phone at [phone] for questions and concerns.    For individual provider: See Shea Evans

## 2018-03-09 NOTE — Progress Notes (Signed)
SLP Cancellation Note  Patient Details Name: Jason Lane MRN: [id number] DOB: [birth year]   Cancelled treatment:       Reason Eval/Treat Not Completed: Other (comment). MBS cancelled this morning. RN reports plan is to DC pt to facility with hospice. If needs arise, please do not hesitate to reconsult. Thank you!  Arlys Scatena B. Quentin O[facility], CCC-SLP Speech Language Pathologist [phone]  Shonna Chock 03/09/2018, 10:20 AM

## 2018-03-09 NOTE — Discharge Summary (Signed)
Physician Discharge Summary  Jason Lane NGE:[id number] DOB: [birth year] DOA: 03/07/2018  PCP: Biagio Borg, MD  Admit date: 03/07/2018 Discharge date: 03/09/2018  Admitted From: SNF  Disposition:  SNF    Recommendations for Outpatient Follow-up:  1. Return to SNF with Hospice and comfort care     Discharge Condition:  stable  CODE STATUS:  DNR   Diet recommendation:  Regular diet as tolerated Consultations:  Palliative care    Discharge Diagnoses:  Principal Problem:   Acute respiratory failure with hypoxia ([place]) Active Problems:   Hyperlipidemia   Essential hypertension   BPH associated with nocturia   Pancytopenia (HCC)   Acute leukemia (HCC)   Chronic diastolic CHF (congestive heart failure) ([place])   Pressure injury of skin    Brief Summary: Jason Lane is a 84 y.o.malewith medical history significant forleukemia with pancytopenia, hypertension, hyperlipidemia, CHF,and BPH who was sent to the ED by his nursing facility for increasing confusion over the last 48 hours and fever. Patient is disoriented but is able to answer some yes/no questions.  - main complaint- dyspnea, dry cough- diarrhea and constipation on and off-  reports difficulty with urination.  He was recently admitted at [facility] long hospital from 03/03/2018 to 03/04/18 for symptomatic anemia & high outpt CHF.He was transfused 2 units of packed RBCs.Hemoglobin on discharge was 8.9. He was seen by palliative care discharged to his nursing facility with hospice care. Patient states he has no family or person ofcontact to offer assistance.  In ED> temp 103.7, SPO2 99% on supplemental oxygen. His oxygenation saturation decreased to mid 80s, RR ^- he was placed on nonrebreather. CXR: new consolidation at the right lung base and pulmonary edema. - given vancomycin and cefepime, started on BiPAP -  consult to palliative care   - DNR/ [facility]  Hospital Course:  Principal Problem:   Acute respiratory  failure with hypoxia  / RLL pneumonia Sepsis - weaned off of Bipap to 3 L - temp 100.6 today - MRSA PCR + - RLL infiltrate on CXR   - Influenza neg - resp panel pending - ? MRSA pneumonia or aspiration - he was on  Vanc and Cefepime but he has confirmed today that he just wants to be comfort care - antibiotics stopped- currently on 6 L O2- can transition to 2 L  - MOST form filled out by palliative care Asencion Gowda, NP > no antibiotics, IVF, Feeding tube, DNR  Active Problems:  Hypokalemia - K 2.9, Mg normal- replaced   AKI / Chronic diastolic CHF (congestive heart failure) - mod LVH on ECHO 10/19- normal EF - baseline Cr 0.6- Cr on admission 1.19  - takes oral Lasix 20 mg at home daily - Lasix 40 mg IV given yesterday as CXR suggested pulme edema however on exam he appears dehydrated- started slow IVF- stopped today     BPH associated with nocturia - Flomax     Acute leukemia with anemia and thrombocytopenia - not a candidate for chemo - Hb slightly low today at 7.5 - Platelets at baseline ~ 40-50 - WBC stable  Peripheral neuropathy - Gabapentin  RN Pressure Injury Documentation: Pressure Injury 03/07/18 Stage II -  Partial thickness loss of dermis presenting as a shallow open ulcer with a red, pink wound bed without slough. (Active)  03/07/18 2020   Location: Sacrum  Location Orientation: Mid;Lower  Staging: Stage II -  Partial thickness loss of dermis presenting as a shallow open ulcer with a red, pink  wound bed without slough.  Wound Description (Comments):   Present on Admission: Yes      Discharge Exam: Vitals:   03/09/18 0742 03/09/18 0751  BP:  (!) 146/75  Pulse: (!) 108 (!) 107  Resp: 20 20  Temp:  98.2 F (36.8 C)  SpO2: 100% 100%   Vitals:   03/09/18 0547 03/09/18 0658 03/09/18 0742 03/09/18 0751  BP:    (!) 146/75  Pulse: 97 (!) 126 (!) 108 (!) 107  Resp: (!) 32 (!) 22 20 20   Temp:    98.2 F (36.8 C)  TempSrc:    Oral  SpO2: 90%  96% 100% 100%  Weight:      Height:        General: Pt is alert, awake, not in acute distress-  Cardiovascular: RRR, S1/S2 +, no rubs, no gallops Respiratory: mild rhonchi- on 6 L o2 Abdominal: Soft, NT, ND, bowel sounds + Extremities: no edema, no cyanosis   Discharge Instructions   Allergies as of 03/09/2018   No Known Allergies     Medication List    STOP taking these medications   furosemide 20 MG tablet Commonly known as:  LASIX   meclizine 12.5 MG tablet Commonly known as:  ANTIVERT   multivitamin capsule   tamsulosin 0.4 MG Caps capsule Commonly known as:  FLOMAX     TAKE these medications   acetaminophen 325 MG tablet Commonly known as:  TYLENOL Take 2 tablets (650 mg total) by mouth every 6 (six) hours as needed for moderate pain or fever.   acetaminophen 650 MG suppository Commonly known as:  TYLENOL Place 1 suppository (650 mg total) rectally every 6 (six) hours as needed for fever, mild pain or moderate pain.   gabapentin 100 MG capsule Commonly known as:  NEURONTIN Take 1 capsule (100 mg total) by mouth at bedtime.       No Known Allergies   Procedures/Studies:    Dg Chest 2 View  Result Date: 03/03/2018 CLINICAL DATA:  Hypoxia EXAM: CHEST - 2 VIEW COMPARISON:  02/23/2018 FINDINGS: Cardiac shadow is within normal limits. The lungs are well aerated bilaterally. Mild increased vascular congestion is noted with early interstitial edema. Bibasilar atelectatic changes are noted. Small posterior effusions are seen. Degenerative changes of the thoracic spine are noted. IMPRESSION: Changes of mild CHF with bibasilar atelectasis. Electronically Signed   By: Inez Catalina M.D.   On: 03/03/2018 15:47   Dg Chest 2 View  Result Date: 02/06/2018 CLINICAL DATA:  [place] today.  Generalized weakness. EXAM: CHEST - 2 VIEW COMPARISON:  06/23/2012 FINDINGS: Heart size upper limits of normal. Chronic aortic atherosclerosis. Bilateral pleural effusions layering  dependently with dependent atelectasis. Mild interstitial edema. No acute bone finding. IMPRESSION: Mild interstitial edema. Small effusions layering dependently with dependent atelectasis. Findings most consistent with congestive heart failure. Could not rule out basilar pneumonia, but the findings may simply be due to effusion associated atelectasis. Electronically Signed   By: Nelson Chimes M.D.   On: 02/04/2018 16:13   Ct Head Wo Contrast  Result Date: 02/15/2018 CLINICAL DATA:  Vertigo.  Weakness. EXAM: CT HEAD WITHOUT CONTRAST TECHNIQUE: Contiguous axial images were obtained from the base of the skull through the vertex without intravenous contrast. COMPARISON:  None. FINDINGS: Brain: No subdural, epidural, or subarachnoid hemorrhage. No mass effect or midline shift. Ventricles and sulci are prominent consistent volume loss. Cerebellum, brainstem, and basal cisterns are normal. Scattered white matter changes are noted. No acute cortical ischemia  or infarct. Vascular: Calcified atherosclerosis in the intracranial carotids. Skull: Normal. Negative for fracture or focal lesion. Sinuses/Orbits: No acute finding. Other: None. IMPRESSION: No acute intracranial abnormalities. No cause for syncope identified. Electronically Signed   By: Dorise Bullion III M.D   On: 02/24/2018 17:07   Dg Chest Port 1 View  Result Date: 03/09/2018 CLINICAL DATA:  Congestion, follow-up EXAM: PORTABLE CHEST 1 VIEW COMPARISON:  Portable chest x-ray of 03/07/2018 and 03/03/2017 FINDINGS: There has been further interval worsening of the opacity at the right lung base most consistent with focal pneumonia. No definite pleural effusion is seen. Cardiomegaly is stable and minimal pulmonary vascular congestion cannot be excluded. IMPRESSION: 1. Further worsening of opacity at the right lung base most consistent with progression of pneumonia. 2. Stable cardiomegaly. Cannot exclude very minimal pulmonary vascular congestion. Electronically  Signed   By: Ivar Drape M.D.   On: 03/09/2018 10:01   Dg Chest Portable 1 View  Result Date: 03/07/2018 CLINICAL DATA:  Suspected infection EXAM: PORTABLE CHEST 1 VIEW COMPARISON:  March 03, 2018 FINDINGS: The heart size and mediastinal contours are stable. There is pulmonary edema. Consolidation of right lung base is noted. There is no pleural effusion. The visualized skeletal structures are unremarkable. IMPRESSION: Right lung base pneumonia. Pulmonary edema. Electronically Signed   By: Abelardo Diesel M.D.   On: 03/07/2018 16:26      The results of significant diagnostics from this hospitalization (including imaging, microbiology, ancillary and laboratory) are listed below for reference.     Microbiology: Recent Results (from the past 240 hour(s))  Culture, blood (Routine x 2)     Status: None (Preliminary result)   Collection Time: 03/07/18  3:55 PM  Result Value Ref Range Status   Specimen Description BLOOD RIGHT ANTECUBITAL  Final   Special Requests   Final    BOTTLES DRAWN AEROBIC AND ANAEROBIC Blood Culture adequate volume   Culture   Final    NO GROWTH < 24 HOURS Performed at [facility], [address] [place], [place] [address]    Report Status PENDING  Incomplete  Culture, blood (Routine x 2)     Status: None (Preliminary result)   Collection Time: 03/07/18  4:08 PM  Result Value Ref Range Status   Specimen Description SITE NOT SPECIFIED  Final   Special Requests   Final    BOTTLES DRAWN AEROBIC AND ANAEROBIC Blood Culture results may not be optimal due to an excessive volume of blood received in culture bottles   Culture   Final    NO GROWTH < 24 HOURS Performed at [facility] [address] [place],  [address]    Report Status PENDING  Incomplete  MRSA PCR Screening     Status: Abnormal   Collection Time: 03/07/18  8:02 PM  Result Value Ref Range Status   MRSA by PCR POSITIVE (A) NEGATIVE Final    Comment:        The GeneXpert MRSA Assay  (FDA approved for NASAL specimens only), is one component of a comprehensive MRSA colonization surveillance program. It is not intended to diagnose MRSA infection nor to guide or monitor treatment for MRSA infections. RESULT CALLED TO, READ BACK BY AND VERIFIED WITH: RN G PASCULL 03/07/18 AT 2159 BY CM Performed at [facility], [place] [address], [place],  [address]   Respiratory Panel by PCR     Status: None   Collection Time: 03/08/18 11:41 AM  Result  Value Ref Range Status   Adenovirus NOT DETECTED NOT DETECTED Final   Coronavirus 229E NOT DETECTED NOT DETECTED Final   Coronavirus HKU1 NOT DETECTED NOT DETECTED Final   Coronavirus NL63 NOT DETECTED NOT DETECTED Final   Coronavirus OC43 NOT DETECTED NOT DETECTED Final   Metapneumovirus NOT DETECTED NOT DETECTED Final   Rhinovirus / Enterovirus NOT DETECTED NOT DETECTED Final   Influenza A NOT DETECTED NOT DETECTED Final   Influenza B NOT DETECTED NOT DETECTED Final   Parainfluenza Virus 1 NOT DETECTED NOT DETECTED Final   Parainfluenza Virus 2 NOT DETECTED NOT DETECTED Final   Parainfluenza Virus 3 NOT DETECTED NOT DETECTED Final   Parainfluenza Virus 4 NOT DETECTED NOT DETECTED Final   Respiratory Syncytial Virus NOT DETECTED NOT DETECTED Final   Bordetella pertussis NOT DETECTED NOT DETECTED Final   Chlamydophila pneumoniae NOT DETECTED NOT DETECTED Final   Mycoplasma pneumoniae NOT DETECTED NOT DETECTED Final    Comment: Performed at [facility], [place] [address] [place], [place] [address]     Labs: BNP (last 3 results) Recent Labs    02/15/2018 1718 03/03/18 1428  BNP 388.8* 400.8*   Basic Metabolic Panel: Recent Labs  Lab 03/04/18 0424 03/07/18 1602 03/08/18 0233 03/08/18 1115 03/08/18 1546 03/09/18 0240  NA 140 138 140  --  138 141  K 3.8 3.1* 2.9*  --  3.4* 4.3  CL 105 99 104  --  101 107  CO2 28 27 29   --  27 26  GLUCOSE 96 140* 128*  --  131* 123*  BUN 19 29* 33*  --  41* 37*   CREATININE 0.64 1.19 1.20  --  1.44* 1.23  CALCIUM 8.2* 7.9* 7.6*  --  7.5* 7.8*  MG 2.0  --   --  2.1  --   --    Liver Function Tests: Recent Labs  Lab 03/07/18 1602  AST 46*  ALT 23  ALKPHOS 61  BILITOT 1.6*  PROT 5.5*  ALBUMIN 3.0*   No results for input(s): LIPASE, AMYLASE in the last 168 hours. No results for input(s): AMMONIA in the last 168 hours. CBC: Recent Labs  Lab 03/03/18 1428 03/04/18 0424 03/07/18 1602 03/08/18 0233 03/09/18 0240  WBC 5.3 8.6 7.9 4.9 4.2  NEUTROABS 0.1* 0.3* 1.1*  --   --   HGB 6.5* 8.9* 8.7* 7.5* 7.8*  HCT 20.3* 27.6* 26.8* 23.4* 25.6*  MCV 104.1* 98.9 98.9 98.3 100.4*  PLT 49* 48* 53* 45* 45*   Cardiac Enzymes: Recent Labs  Lab 03/03/18 1428  TROPONINI 0.03*   BNP: Invalid input(s): POCBNP CBG: No results for input(s): GLUCAP in the last 168 hours. D-Dimer No results for input(s): DDIMER in the last 72 hours. Hgb A1c No results for input(s): HGBA1C in the last 72 hours. Lipid Profile No results for input(s): CHOL, HDL, LDLCALC, TRIG, CHOLHDL, LDLDIRECT in the last 72 hours. Thyroid function studies No results for input(s): TSH, T4TOTAL, T3FREE, THYROIDAB in the last 72 hours.  Invalid input(s): FREET3 Anemia work up No results for input(s): VITAMINB12, FOLATE, FERRITIN, TIBC, IRON, RETICCTPCT in the last 72 hours. Urinalysis    Component Value Date/Time   COLORURINE YELLOW 03/07/2018 1757   APPEARANCEUR HAZY (A) 03/07/2018 1757   LABSPEC 1.013 03/07/2018 1757   PHURINE 6.0 03/07/2018 1757   GLUCOSEU NEGATIVE 03/07/2018 1757   GLUCOSEU NEGATIVE 03/31/2017 1000   HGBUR MODERATE (A) 03/07/2018 1757   BILIRUBINUR NEGATIVE 03/07/2018 1757   KETONESUR NEGATIVE  03/07/2018 1757   PROTEINUR 30 (A) 03/07/2018 1757   UROBILINOGEN 0.2 03/31/2017 1000   NITRITE NEGATIVE 03/07/2018 1757   LEUKOCYTESUR NEGATIVE 03/07/2018 1757   Sepsis Labs Invalid input(s): PROCALCITONIN,  WBC,  LACTICIDVEN Microbiology Recent Results  (from the past 240 hour(s))  Culture, blood (Routine x 2)     Status: None (Preliminary result)   Collection Time: 03/07/18  3:55 PM  Result Value Ref Range Status   Specimen Description BLOOD RIGHT ANTECUBITAL  Final   Special Requests   Final    BOTTLES DRAWN AEROBIC AND ANAEROBIC Blood Culture adequate volume   Culture   Final    NO GROWTH < 24 HOURS Performed at [facility], [place] [address], [place], [place] [address]    Report Status PENDING  Incomplete  Culture, blood (Routine x 2)     Status: None (Preliminary result)   Collection Time: 03/07/18  4:08 PM  Result Value Ref Range Status   Specimen Description SITE NOT SPECIFIED  Final   Special Requests   Final    BOTTLES DRAWN AEROBIC AND ANAEROBIC Blood Culture results may not be optimal due to an excessive volume of blood received in culture bottles   Culture   Final    NO GROWTH < 24 HOURS Performed at [facility] [address], [place], [place] [address]    Report Status PENDING  Incomplete  MRSA PCR Screening     Status: Abnormal   Collection Time: 03/07/18  8:02 PM  Result Value Ref Range Status   MRSA by PCR POSITIVE (A) NEGATIVE Final    Comment:        The GeneXpert MRSA Assay (FDA approved for NASAL specimens only), is one component of a comprehensive MRSA colonization surveillance program. It is not intended to diagnose MRSA infection nor to guide or monitor treatment for MRSA infections. RESULT CALLED TO, READ BACK BY AND VERIFIED WITH: RN G PASCULL 03/07/18 AT 2159 BY CM Performed at [facility], [place] [address] [place], [place] [address]   Respiratory Panel by PCR     Status: None   Collection Time: 03/08/18 11:41 AM  Result Value Ref Range Status   Adenovirus NOT DETECTED NOT DETECTED Final   Coronavirus 229E NOT DETECTED NOT DETECTED Final   Coronavirus HKU1 NOT DETECTED NOT DETECTED Final   Coronavirus NL63 NOT DETECTED NOT DETECTED Final   Coronavirus OC43 NOT DETECTED NOT  DETECTED Final   Metapneumovirus NOT DETECTED NOT DETECTED Final   Rhinovirus / Enterovirus NOT DETECTED NOT DETECTED Final   Influenza A NOT DETECTED NOT DETECTED Final   Influenza B NOT DETECTED NOT DETECTED Final   Parainfluenza Virus 1 NOT DETECTED NOT DETECTED Final   Parainfluenza Virus 2 NOT DETECTED NOT DETECTED Final   Parainfluenza Virus 3 NOT DETECTED NOT DETECTED Final   Parainfluenza Virus 4 NOT DETECTED NOT DETECTED Final   Respiratory Syncytial Virus NOT DETECTED NOT DETECTED Final   Bordetella pertussis NOT DETECTED NOT DETECTED Final   Chlamydophila pneumoniae NOT DETECTED NOT DETECTED Final   Mycoplasma pneumoniae NOT DETECTED NOT DETECTED Final    Comment: Performed at [facility], [place] [address], [place], [place] [address]     Time coordinating discharge in minutes: 54  SIGNED:   Debbe Odea, MD  [facility] 03/09/2018, 11:30 AM Pager   If 7PM-7AM, please contact night-coverage [email or web address] Password [email or web address]

## 2018-03-10 DIAGNOSIS — C9100 Acute lymphoblastic leukemia not having achieved remission: Secondary | ICD-10-CM | POA: Diagnosis not present

## 2018-03-10 DIAGNOSIS — J189 Pneumonia, unspecified organism: Secondary | ICD-10-CM | POA: Diagnosis not present

## 2018-03-10 DIAGNOSIS — R5381 Other malaise: Secondary | ICD-10-CM | POA: Diagnosis not present

## 2018-03-12 LAB — CULTURE, BLOOD (ROUTINE X 2)
Culture: NO GROWTH
Culture: NO GROWTH
SPECIAL REQUESTS: ADEQUATE

## 2018-03-14 DIAGNOSIS — I5030 Unspecified diastolic (congestive) heart failure: Secondary | ICD-10-CM | POA: Diagnosis not present

## 2018-03-14 DIAGNOSIS — I10 Essential (primary) hypertension: Secondary | ICD-10-CM | POA: Diagnosis not present

## 2018-03-14 DIAGNOSIS — C9592 Leukemia, unspecified, in relapse: Secondary | ICD-10-CM | POA: Diagnosis not present

## 2018-03-14 DIAGNOSIS — K219 Gastro-esophageal reflux disease without esophagitis: Secondary | ICD-10-CM | POA: Diagnosis not present

## 2018-03-14 DIAGNOSIS — N401 Enlarged prostate with lower urinary tract symptoms: Secondary | ICD-10-CM | POA: Diagnosis not present

## 2018-03-14 DIAGNOSIS — N183 Chronic kidney disease, stage 3 (moderate): Secondary | ICD-10-CM | POA: Diagnosis not present

## 2018-03-14 DIAGNOSIS — E785 Hyperlipidemia, unspecified: Secondary | ICD-10-CM | POA: Diagnosis not present

## 2018-03-14 DIAGNOSIS — D61818 Other pancytopenia: Secondary | ICD-10-CM | POA: Diagnosis not present

## 2018-03-16 DIAGNOSIS — C9592 Leukemia, unspecified, in relapse: Secondary | ICD-10-CM | POA: Diagnosis not present

## 2018-03-16 DIAGNOSIS — D61818 Other pancytopenia: Secondary | ICD-10-CM | POA: Diagnosis not present

## 2018-03-16 DIAGNOSIS — I10 Essential (primary) hypertension: Secondary | ICD-10-CM | POA: Diagnosis not present

## 2018-03-16 DIAGNOSIS — I5030 Unspecified diastolic (congestive) heart failure: Secondary | ICD-10-CM | POA: Diagnosis not present

## 2018-03-16 DIAGNOSIS — E785 Hyperlipidemia, unspecified: Secondary | ICD-10-CM | POA: Diagnosis not present

## 2018-03-16 DIAGNOSIS — N183 Chronic kidney disease, stage 3 (moderate): Secondary | ICD-10-CM | POA: Diagnosis not present

## 2018-03-21 DIAGNOSIS — N183 Chronic kidney disease, stage 3 (moderate): Secondary | ICD-10-CM | POA: Diagnosis not present

## 2018-03-21 DIAGNOSIS — I10 Essential (primary) hypertension: Secondary | ICD-10-CM | POA: Diagnosis not present

## 2018-03-21 DIAGNOSIS — C9592 Leukemia, unspecified, in relapse: Secondary | ICD-10-CM | POA: Diagnosis not present

## 2018-03-21 DIAGNOSIS — I5030 Unspecified diastolic (congestive) heart failure: Secondary | ICD-10-CM | POA: Diagnosis not present

## 2018-03-21 DIAGNOSIS — E785 Hyperlipidemia, unspecified: Secondary | ICD-10-CM | POA: Diagnosis not present

## 2018-03-21 DIAGNOSIS — D61818 Other pancytopenia: Secondary | ICD-10-CM | POA: Diagnosis not present

## 2018-03-22 DIAGNOSIS — N183 Chronic kidney disease, stage 3 (moderate): Secondary | ICD-10-CM | POA: Diagnosis not present

## 2018-03-22 DIAGNOSIS — I10 Essential (primary) hypertension: Secondary | ICD-10-CM | POA: Diagnosis not present

## 2018-03-22 DIAGNOSIS — I5030 Unspecified diastolic (congestive) heart failure: Secondary | ICD-10-CM | POA: Diagnosis not present

## 2018-03-22 DIAGNOSIS — R5381 Other malaise: Secondary | ICD-10-CM | POA: Diagnosis not present

## 2018-03-22 DIAGNOSIS — C9100 Acute lymphoblastic leukemia not having achieved remission: Secondary | ICD-10-CM | POA: Diagnosis not present

## 2018-03-22 DIAGNOSIS — D649 Anemia, unspecified: Secondary | ICD-10-CM | POA: Diagnosis not present

## 2018-03-22 DIAGNOSIS — G629 Polyneuropathy, unspecified: Secondary | ICD-10-CM | POA: Diagnosis not present

## 2018-03-22 DIAGNOSIS — E785 Hyperlipidemia, unspecified: Secondary | ICD-10-CM | POA: Diagnosis not present

## 2018-03-22 DIAGNOSIS — D61818 Other pancytopenia: Secondary | ICD-10-CM | POA: Diagnosis not present

## 2018-03-22 DIAGNOSIS — C9592 Leukemia, unspecified, in relapse: Secondary | ICD-10-CM | POA: Diagnosis not present

## 2018-03-23 DIAGNOSIS — I5030 Unspecified diastolic (congestive) heart failure: Secondary | ICD-10-CM | POA: Diagnosis not present

## 2018-03-23 DIAGNOSIS — N183 Chronic kidney disease, stage 3 (moderate): Secondary | ICD-10-CM | POA: Diagnosis not present

## 2018-03-23 DIAGNOSIS — E785 Hyperlipidemia, unspecified: Secondary | ICD-10-CM | POA: Diagnosis not present

## 2018-03-23 DIAGNOSIS — C9592 Leukemia, unspecified, in relapse: Secondary | ICD-10-CM | POA: Diagnosis not present

## 2018-03-23 DIAGNOSIS — D61818 Other pancytopenia: Secondary | ICD-10-CM | POA: Diagnosis not present

## 2018-03-23 DIAGNOSIS — I10 Essential (primary) hypertension: Secondary | ICD-10-CM | POA: Diagnosis not present

## 2018-03-24 DIAGNOSIS — N183 Chronic kidney disease, stage 3 (moderate): Secondary | ICD-10-CM | POA: Diagnosis not present

## 2018-03-24 DIAGNOSIS — C9592 Leukemia, unspecified, in relapse: Secondary | ICD-10-CM | POA: Diagnosis not present

## 2018-03-24 DIAGNOSIS — I10 Essential (primary) hypertension: Secondary | ICD-10-CM | POA: Diagnosis not present

## 2018-03-24 DIAGNOSIS — E785 Hyperlipidemia, unspecified: Secondary | ICD-10-CM | POA: Diagnosis not present

## 2018-03-24 DIAGNOSIS — D61818 Other pancytopenia: Secondary | ICD-10-CM | POA: Diagnosis not present

## 2018-03-24 DIAGNOSIS — I5030 Unspecified diastolic (congestive) heart failure: Secondary | ICD-10-CM | POA: Diagnosis not present

## 2018-03-25 DIAGNOSIS — I10 Essential (primary) hypertension: Secondary | ICD-10-CM | POA: Diagnosis not present

## 2018-03-25 DIAGNOSIS — I5030 Unspecified diastolic (congestive) heart failure: Secondary | ICD-10-CM | POA: Diagnosis not present

## 2018-03-25 DIAGNOSIS — C9592 Leukemia, unspecified, in relapse: Secondary | ICD-10-CM | POA: Diagnosis not present

## 2018-03-25 DIAGNOSIS — N183 Chronic kidney disease, stage 3 (moderate): Secondary | ICD-10-CM | POA: Diagnosis not present

## 2018-03-25 DIAGNOSIS — D61818 Other pancytopenia: Secondary | ICD-10-CM | POA: Diagnosis not present

## 2018-03-25 DIAGNOSIS — E785 Hyperlipidemia, unspecified: Secondary | ICD-10-CM | POA: Diagnosis not present

## 2018-03-27 DIAGNOSIS — C9592 Leukemia, unspecified, in relapse: Secondary | ICD-10-CM | POA: Diagnosis not present

## 2018-03-27 DIAGNOSIS — I5030 Unspecified diastolic (congestive) heart failure: Secondary | ICD-10-CM | POA: Diagnosis not present

## 2018-03-27 DIAGNOSIS — I10 Essential (primary) hypertension: Secondary | ICD-10-CM | POA: Diagnosis not present

## 2018-03-27 DIAGNOSIS — E785 Hyperlipidemia, unspecified: Secondary | ICD-10-CM | POA: Diagnosis not present

## 2018-03-27 DIAGNOSIS — N183 Chronic kidney disease, stage 3 (moderate): Secondary | ICD-10-CM | POA: Diagnosis not present

## 2018-03-27 DIAGNOSIS — D61818 Other pancytopenia: Secondary | ICD-10-CM | POA: Diagnosis not present

## 2018-03-28 DIAGNOSIS — D61818 Other pancytopenia: Secondary | ICD-10-CM | POA: Diagnosis not present

## 2018-03-28 DIAGNOSIS — I10 Essential (primary) hypertension: Secondary | ICD-10-CM | POA: Diagnosis not present

## 2018-03-28 DIAGNOSIS — I5030 Unspecified diastolic (congestive) heart failure: Secondary | ICD-10-CM | POA: Diagnosis not present

## 2018-03-28 DIAGNOSIS — C9100 Acute lymphoblastic leukemia not having achieved remission: Secondary | ICD-10-CM | POA: Diagnosis not present

## 2018-03-28 DIAGNOSIS — R5381 Other malaise: Secondary | ICD-10-CM | POA: Diagnosis not present

## 2018-03-28 DIAGNOSIS — C9592 Leukemia, unspecified, in relapse: Secondary | ICD-10-CM | POA: Diagnosis not present

## 2018-03-28 DIAGNOSIS — E785 Hyperlipidemia, unspecified: Secondary | ICD-10-CM | POA: Diagnosis not present

## 2018-03-28 DIAGNOSIS — N183 Chronic kidney disease, stage 3 (moderate): Secondary | ICD-10-CM | POA: Diagnosis not present

## 2018-03-29 DIAGNOSIS — N183 Chronic kidney disease, stage 3 (moderate): Secondary | ICD-10-CM | POA: Diagnosis not present

## 2018-03-29 DIAGNOSIS — I5030 Unspecified diastolic (congestive) heart failure: Secondary | ICD-10-CM | POA: Diagnosis not present

## 2018-03-29 DIAGNOSIS — E785 Hyperlipidemia, unspecified: Secondary | ICD-10-CM | POA: Diagnosis not present

## 2018-03-29 DIAGNOSIS — D61818 Other pancytopenia: Secondary | ICD-10-CM | POA: Diagnosis not present

## 2018-03-29 DIAGNOSIS — C9592 Leukemia, unspecified, in relapse: Secondary | ICD-10-CM | POA: Diagnosis not present

## 2018-03-29 DIAGNOSIS — I10 Essential (primary) hypertension: Secondary | ICD-10-CM | POA: Diagnosis not present

## 2018-03-31 DIAGNOSIS — I10 Essential (primary) hypertension: Secondary | ICD-10-CM | POA: Diagnosis not present

## 2018-03-31 DIAGNOSIS — I5030 Unspecified diastolic (congestive) heart failure: Secondary | ICD-10-CM | POA: Diagnosis not present

## 2018-03-31 DIAGNOSIS — N183 Chronic kidney disease, stage 3 (moderate): Secondary | ICD-10-CM | POA: Diagnosis not present

## 2018-03-31 DIAGNOSIS — E785 Hyperlipidemia, unspecified: Secondary | ICD-10-CM | POA: Diagnosis not present

## 2018-03-31 DIAGNOSIS — D61818 Other pancytopenia: Secondary | ICD-10-CM | POA: Diagnosis not present

## 2018-03-31 DIAGNOSIS — C9592 Leukemia, unspecified, in relapse: Secondary | ICD-10-CM | POA: Diagnosis not present

## 2018-04-02 DIAGNOSIS — N401 Enlarged prostate with lower urinary tract symptoms: Secondary | ICD-10-CM | POA: Diagnosis not present

## 2018-04-02 DIAGNOSIS — I10 Essential (primary) hypertension: Secondary | ICD-10-CM | POA: Diagnosis not present

## 2018-04-02 DIAGNOSIS — N183 Chronic kidney disease, stage 3 (moderate): Secondary | ICD-10-CM | POA: Diagnosis not present

## 2018-04-02 DIAGNOSIS — I5030 Unspecified diastolic (congestive) heart failure: Secondary | ICD-10-CM | POA: Diagnosis not present

## 2018-04-02 DIAGNOSIS — E785 Hyperlipidemia, unspecified: Secondary | ICD-10-CM | POA: Diagnosis not present

## 2018-04-02 DIAGNOSIS — C9592 Leukemia, unspecified, in relapse: Secondary | ICD-10-CM | POA: Diagnosis not present

## 2018-04-02 DIAGNOSIS — D61818 Other pancytopenia: Secondary | ICD-10-CM | POA: Diagnosis not present

## 2018-04-02 DIAGNOSIS — K219 Gastro-esophageal reflux disease without esophagitis: Secondary | ICD-10-CM | POA: Diagnosis not present

## 2018-04-03 DIAGNOSIS — C9592 Leukemia, unspecified, in relapse: Secondary | ICD-10-CM | POA: Diagnosis not present

## 2018-04-03 DIAGNOSIS — I5030 Unspecified diastolic (congestive) heart failure: Secondary | ICD-10-CM | POA: Diagnosis not present

## 2018-04-03 DIAGNOSIS — I10 Essential (primary) hypertension: Secondary | ICD-10-CM | POA: Diagnosis not present

## 2018-04-03 DIAGNOSIS — N183 Chronic kidney disease, stage 3 (moderate): Secondary | ICD-10-CM | POA: Diagnosis not present

## 2018-04-03 DIAGNOSIS — E785 Hyperlipidemia, unspecified: Secondary | ICD-10-CM | POA: Diagnosis not present

## 2018-04-03 DIAGNOSIS — D61818 Other pancytopenia: Secondary | ICD-10-CM | POA: Diagnosis not present

## 2018-04-04 DIAGNOSIS — C9592 Leukemia, unspecified, in relapse: Secondary | ICD-10-CM | POA: Diagnosis not present

## 2018-04-04 DIAGNOSIS — D61818 Other pancytopenia: Secondary | ICD-10-CM | POA: Diagnosis not present

## 2018-04-04 DIAGNOSIS — C9100 Acute lymphoblastic leukemia not having achieved remission: Secondary | ICD-10-CM | POA: Diagnosis not present

## 2018-04-04 DIAGNOSIS — R5381 Other malaise: Secondary | ICD-10-CM | POA: Diagnosis not present

## 2018-04-04 DIAGNOSIS — N183 Chronic kidney disease, stage 3 (moderate): Secondary | ICD-10-CM | POA: Diagnosis not present

## 2018-04-04 DIAGNOSIS — I10 Essential (primary) hypertension: Secondary | ICD-10-CM | POA: Diagnosis not present

## 2018-04-04 DIAGNOSIS — I5030 Unspecified diastolic (congestive) heart failure: Secondary | ICD-10-CM | POA: Diagnosis not present

## 2018-04-04 DIAGNOSIS — E785 Hyperlipidemia, unspecified: Secondary | ICD-10-CM | POA: Diagnosis not present

## 2018-04-05 DIAGNOSIS — I5030 Unspecified diastolic (congestive) heart failure: Secondary | ICD-10-CM | POA: Diagnosis not present

## 2018-04-05 DIAGNOSIS — E785 Hyperlipidemia, unspecified: Secondary | ICD-10-CM | POA: Diagnosis not present

## 2018-04-05 DIAGNOSIS — I10 Essential (primary) hypertension: Secondary | ICD-10-CM | POA: Diagnosis not present

## 2018-04-05 DIAGNOSIS — N183 Chronic kidney disease, stage 3 (moderate): Secondary | ICD-10-CM | POA: Diagnosis not present

## 2018-04-05 DIAGNOSIS — C9592 Leukemia, unspecified, in relapse: Secondary | ICD-10-CM | POA: Diagnosis not present

## 2018-04-05 DIAGNOSIS — D61818 Other pancytopenia: Secondary | ICD-10-CM | POA: Diagnosis not present

## 2018-04-06 DIAGNOSIS — C9100 Acute lymphoblastic leukemia not having achieved remission: Secondary | ICD-10-CM | POA: Diagnosis not present

## 2018-04-06 DIAGNOSIS — R5381 Other malaise: Secondary | ICD-10-CM | POA: Diagnosis not present

## 2018-04-07 DIAGNOSIS — E785 Hyperlipidemia, unspecified: Secondary | ICD-10-CM | POA: Diagnosis not present

## 2018-04-07 DIAGNOSIS — I5030 Unspecified diastolic (congestive) heart failure: Secondary | ICD-10-CM | POA: Diagnosis not present

## 2018-04-07 DIAGNOSIS — N183 Chronic kidney disease, stage 3 (moderate): Secondary | ICD-10-CM | POA: Diagnosis not present

## 2018-04-07 DIAGNOSIS — I10 Essential (primary) hypertension: Secondary | ICD-10-CM | POA: Diagnosis not present

## 2018-04-07 DIAGNOSIS — C9592 Leukemia, unspecified, in relapse: Secondary | ICD-10-CM | POA: Diagnosis not present

## 2018-04-07 DIAGNOSIS — D61818 Other pancytopenia: Secondary | ICD-10-CM | POA: Diagnosis not present

## 2018-04-10 DIAGNOSIS — D61818 Other pancytopenia: Secondary | ICD-10-CM | POA: Diagnosis not present

## 2018-04-10 DIAGNOSIS — E785 Hyperlipidemia, unspecified: Secondary | ICD-10-CM | POA: Diagnosis not present

## 2018-04-10 DIAGNOSIS — I10 Essential (primary) hypertension: Secondary | ICD-10-CM | POA: Diagnosis not present

## 2018-04-10 DIAGNOSIS — I5030 Unspecified diastolic (congestive) heart failure: Secondary | ICD-10-CM | POA: Diagnosis not present

## 2018-04-10 DIAGNOSIS — N183 Chronic kidney disease, stage 3 (moderate): Secondary | ICD-10-CM | POA: Diagnosis not present

## 2018-04-10 DIAGNOSIS — C9592 Leukemia, unspecified, in relapse: Secondary | ICD-10-CM | POA: Diagnosis not present

## 2018-05-03 DEATH — deceased

## 2018-06-13 ENCOUNTER — Encounter: Payer: Self-pay | Admitting: Rehabilitation

## 2018-06-13 NOTE — Therapy (Signed)
[facility] [address] [place], [place], [address] Phone: [phone]   Fax:  [phone]  Patient Details  Name: Jason Lane MRN: [id number] Date of Birth: 1928/02/06 Referring Provider:  No ref. provider found  Encounter Date: 06/13/2018   PHYSICAL THERAPY DISCHARGE SUMMARY  Visits from Start of Care: 3  Current functional level related to goals / functional outcomes: Unsure as pt did not return for follow up, called to cx remaining appts due to finances.     Remaining deficits: PT Long Term Goals - 08/11/17 0953      PT LONG TERM GOAL #1   Title  Pt will be independent with HEP in order to indicate improved functional mobility and decreased fall risk.  (Target Date: 09/25/17)    Time  6    Status  New    Target Date  09/25/17      PT LONG TERM GOAL #2   Title  Pt will improve FGA to >/=23/30 in order to indicate decreased fall risk.      Time  6    Period  Weeks    Status  New      PT LONG TERM GOAL #3   Title  Pt will verbalize plans to begin community fitness program in order to maintain gains made in therapy.      Time  6    Period  Weeks    Status  New      PT LONG TERM GOAL #4   Title  Pt will ambulate 1000' at independent level over varying indoor and outdoor surfaces while scanning environment without overt LOB in order to indicate safe return to community mobility.      Time  6    Period  Weeks    Status  New      PT LONG TERM GOAL #5   Title  Pt will be able to perform floor to stand transfer without external support in order to return to daily activities safely.      Time  6    Period  Weeks    Status  New         Education / Equipment: HEP   Plan: Patient agrees to discharge.  Patient goals were not met. Patient is being discharged due to not returning since the last visit.  ?????       Cameron Sprang, PT, MPT [facility] [address]  [place], [place], [address] Phone: [phone]   Fax:  [phone] 06/13/18, 9:31 AM

## 2020-06-14 IMAGING — CR DG CHEST 2V
2 series · 2 of 2 positions shown · non-contrast
Comparison: 06/23/2012

CLINICAL DATA: Fell today.  Generalized weakness.

EXAM:
CHEST - 2 VIEW

[w chest lat]
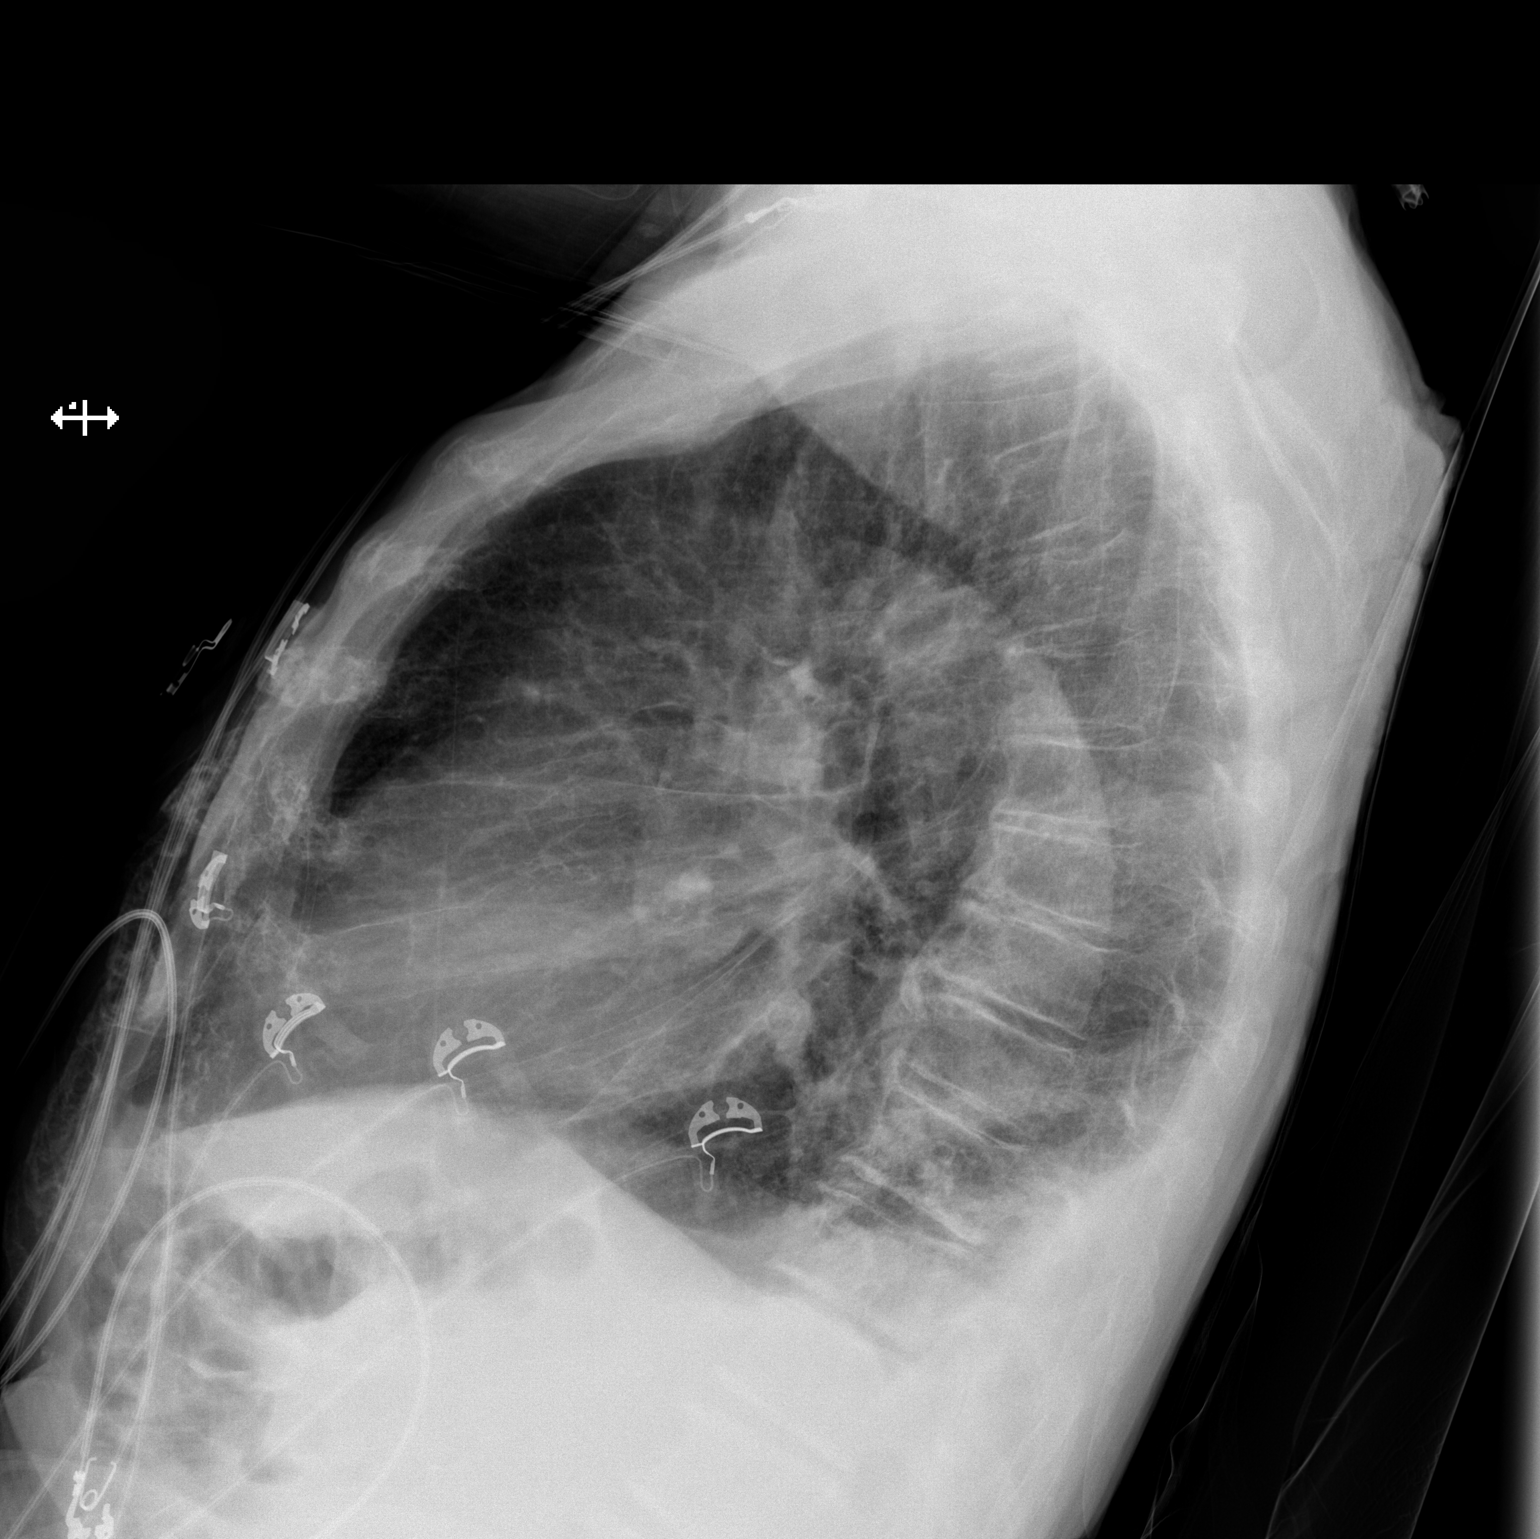

[x chest ap]
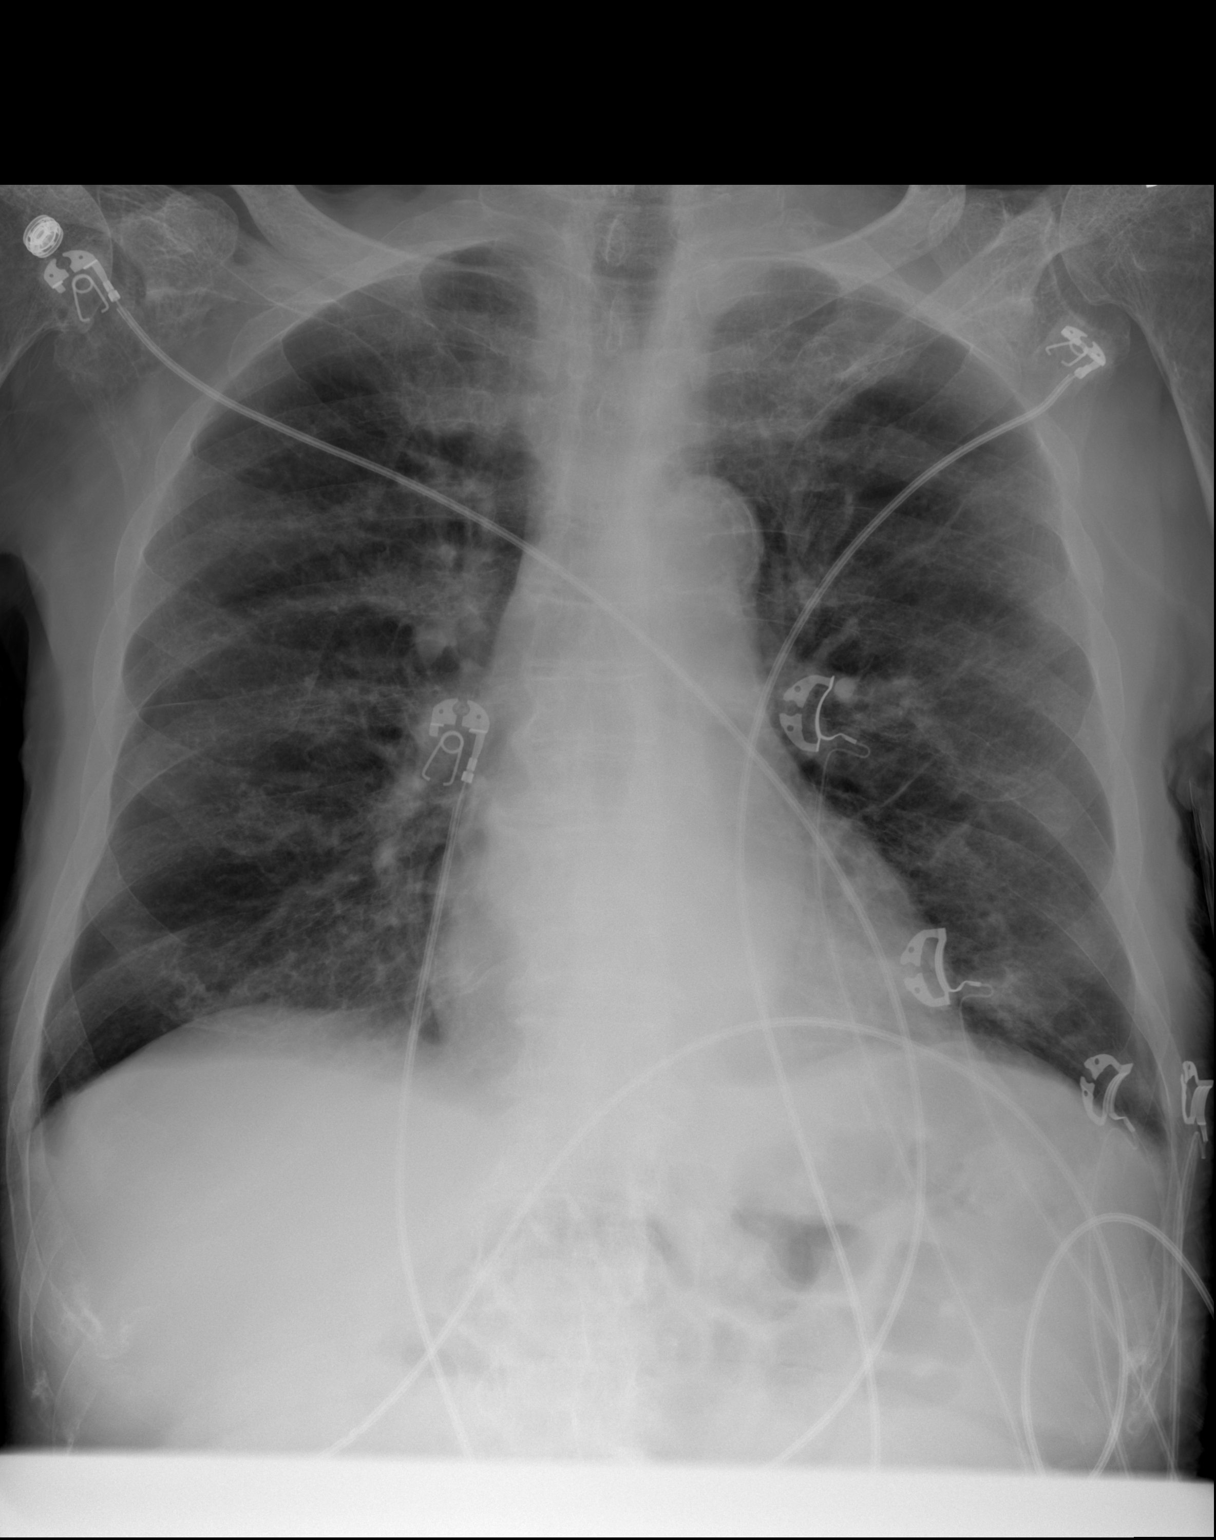

[2 of 2 positions shown; findings below may reference images not displayed]

FINDINGS: Heart size upper limits of normal. Chronic aortic atherosclerosis.
Bilateral pleural effusions layering dependently with dependent
atelectasis. Mild interstitial edema. No acute bone finding.
IMPRESSION: Mild interstitial edema. Small effusions layering dependently with
dependent atelectasis. Findings most consistent with congestive
heart failure. Could not rule out basilar pneumonia, but the
findings may simply be due to effusion associated atelectasis.
